# Patient Record
Sex: Male | Born: 1950 | Race: White | Hispanic: No | Marital: Single | Smoking: Former smoker
Health system: Southern US, Community
[De-identification: ages and names within clinical notes are randomized; demographics above are authoritative.]

## PROBLEM LIST (undated history)

## (undated) DIAGNOSIS — R251 Tremor, unspecified: Secondary | ICD-10-CM

## (undated) DIAGNOSIS — K2270 Barrett's esophagus without dysplasia: Secondary | ICD-10-CM

## (undated) DIAGNOSIS — K648 Other hemorrhoids: Secondary | ICD-10-CM

## (undated) DIAGNOSIS — K760 Fatty (change of) liver, not elsewhere classified: Secondary | ICD-10-CM

## (undated) DIAGNOSIS — H269 Unspecified cataract: Secondary | ICD-10-CM

## (undated) DIAGNOSIS — G709 Myoneural disorder, unspecified: Secondary | ICD-10-CM

## (undated) DIAGNOSIS — H3530 Unspecified macular degeneration: Secondary | ICD-10-CM

## (undated) DIAGNOSIS — E349 Endocrine disorder, unspecified: Secondary | ICD-10-CM

## (undated) DIAGNOSIS — M1990 Unspecified osteoarthritis, unspecified site: Secondary | ICD-10-CM

## (undated) DIAGNOSIS — G629 Polyneuropathy, unspecified: Secondary | ICD-10-CM

## (undated) DIAGNOSIS — M47819 Spondylosis without myelopathy or radiculopathy, site unspecified: Secondary | ICD-10-CM

## (undated) DIAGNOSIS — N400 Enlarged prostate without lower urinary tract symptoms: Secondary | ICD-10-CM

## (undated) DIAGNOSIS — E785 Hyperlipidemia, unspecified: Secondary | ICD-10-CM

## (undated) DIAGNOSIS — R600 Localized edema: Secondary | ICD-10-CM

## (undated) DIAGNOSIS — K5790 Diverticulosis of intestine, part unspecified, without perforation or abscess without bleeding: Secondary | ICD-10-CM

## (undated) DIAGNOSIS — K429 Umbilical hernia without obstruction or gangrene: Secondary | ICD-10-CM

## (undated) DIAGNOSIS — F32A Depression, unspecified: Secondary | ICD-10-CM

## (undated) DIAGNOSIS — E119 Type 2 diabetes mellitus without complications: Secondary | ICD-10-CM

## (undated) DIAGNOSIS — K8681 Exocrine pancreatic insufficiency: Secondary | ICD-10-CM

## (undated) DIAGNOSIS — G4730 Sleep apnea, unspecified: Secondary | ICD-10-CM

## (undated) DIAGNOSIS — D132 Benign neoplasm of duodenum: Secondary | ICD-10-CM

## (undated) DIAGNOSIS — K219 Gastro-esophageal reflux disease without esophagitis: Secondary | ICD-10-CM

## (undated) DIAGNOSIS — H9190 Unspecified hearing loss, unspecified ear: Secondary | ICD-10-CM

## (undated) DIAGNOSIS — I10 Essential (primary) hypertension: Secondary | ICD-10-CM

## (undated) DIAGNOSIS — F329 Major depressive disorder, single episode, unspecified: Secondary | ICD-10-CM

## (undated) DIAGNOSIS — E039 Hypothyroidism, unspecified: Secondary | ICD-10-CM

## (undated) DIAGNOSIS — N529 Male erectile dysfunction, unspecified: Secondary | ICD-10-CM

## (undated) HISTORY — DX: Polyneuropathy, unspecified: G62.9

## (undated) HISTORY — DX: Benign neoplasm of duodenum: D13.2

## (undated) HISTORY — DX: Gastro-esophageal reflux disease without esophagitis: K21.9

## (undated) HISTORY — DX: Localized edema: R60.0

## (undated) HISTORY — DX: Unspecified osteoarthritis, unspecified site: M19.90

## (undated) HISTORY — DX: Hyperlipidemia, unspecified: E78.5

## (undated) HISTORY — DX: Fatty (change of) liver, not elsewhere classified: K76.0

## (undated) HISTORY — DX: Benign prostatic hyperplasia without lower urinary tract symptoms: N40.0

## (undated) HISTORY — PX: UMBILICAL HERNIA REPAIR: SHX196

## (undated) HISTORY — DX: Major depressive disorder, single episode, unspecified: F32.9

## (undated) HISTORY — DX: Umbilical hernia without obstruction or gangrene: K42.9

## (undated) HISTORY — DX: Male erectile dysfunction, unspecified: N52.9

## (undated) HISTORY — DX: Essential (primary) hypertension: I10

## (undated) HISTORY — DX: Barrett's esophagus without dysplasia: K22.70

## (undated) HISTORY — PX: COLONOSCOPY: SHX174

## (undated) HISTORY — DX: Diverticulosis of intestine, part unspecified, without perforation or abscess without bleeding: K57.90

## (undated) HISTORY — DX: Exocrine pancreatic insufficiency: K86.81

## (undated) HISTORY — DX: Type 2 diabetes mellitus without complications: E11.9

## (undated) HISTORY — DX: Unspecified macular degeneration: H35.30

## (undated) HISTORY — DX: Depression, unspecified: F32.A

## (undated) HISTORY — DX: Myoneural disorder, unspecified: G70.9

## (undated) HISTORY — DX: Endocrine disorder, unspecified: E34.9

## (undated) HISTORY — DX: Spondylosis without myelopathy or radiculopathy, site unspecified: M47.819

## (undated) HISTORY — DX: Unspecified hearing loss, unspecified ear: H91.90

## (undated) HISTORY — DX: Tremor, unspecified: R25.1

## (undated) HISTORY — DX: Unspecified cataract: H26.9

## (undated) HISTORY — DX: Other hemorrhoids: K64.8

## (undated) HISTORY — DX: Sleep apnea, unspecified: G47.30

---
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
## 2000-02-15 ENCOUNTER — Emergency Department (HOSPITAL_COMMUNITY): Admission: EM | Admit: 2000-02-15 | Discharge: 2000-02-15 | Payer: Self-pay | Admitting: Emergency Medicine

## 2000-02-15 ENCOUNTER — Encounter: Payer: Self-pay | Admitting: Emergency Medicine

## 2000-04-03 ENCOUNTER — Inpatient Hospital Stay (HOSPITAL_COMMUNITY): Admission: EM | Admit: 2000-04-03 | Discharge: 2000-04-08 | Payer: Self-pay | Admitting: Psychiatry

## 2001-02-20 ENCOUNTER — Encounter (INDEPENDENT_AMBULATORY_CARE_PROVIDER_SITE_OTHER): Payer: Self-pay | Admitting: Specialist

## 2001-02-20 ENCOUNTER — Other Ambulatory Visit: Admission: RE | Admit: 2001-02-20 | Discharge: 2001-02-20 | Payer: Self-pay | Admitting: Gastroenterology

## 2002-01-02 ENCOUNTER — Emergency Department (HOSPITAL_COMMUNITY): Admission: EM | Admit: 2002-01-02 | Discharge: 2002-01-02 | Payer: Self-pay | Admitting: Emergency Medicine

## 2002-07-09 DIAGNOSIS — D126 Benign neoplasm of colon, unspecified: Secondary | ICD-10-CM | POA: Insufficient documentation

## 2003-08-03 ENCOUNTER — Ambulatory Visit (HOSPITAL_BASED_OUTPATIENT_CLINIC_OR_DEPARTMENT_OTHER): Admission: RE | Admit: 2003-08-03 | Discharge: 2003-08-03 | Payer: Self-pay | Admitting: Family Medicine

## 2003-10-25 ENCOUNTER — Ambulatory Visit (HOSPITAL_BASED_OUTPATIENT_CLINIC_OR_DEPARTMENT_OTHER): Admission: RE | Admit: 2003-10-25 | Discharge: 2003-10-25 | Payer: Self-pay | Admitting: Family Medicine

## 2003-11-15 ENCOUNTER — Other Ambulatory Visit (HOSPITAL_COMMUNITY): Admission: RE | Admit: 2003-11-15 | Discharge: 2003-11-18 | Payer: Self-pay | Admitting: Psychiatry

## 2004-12-13 ENCOUNTER — Ambulatory Visit: Payer: Self-pay | Admitting: Gastroenterology

## 2004-12-25 ENCOUNTER — Ambulatory Visit: Payer: Self-pay | Admitting: Gastroenterology

## 2005-05-07 ENCOUNTER — Ambulatory Visit: Payer: Self-pay | Admitting: Gastroenterology

## 2005-07-25 ENCOUNTER — Ambulatory Visit: Payer: Self-pay | Admitting: Gastroenterology

## 2005-08-01 ENCOUNTER — Encounter (INDEPENDENT_AMBULATORY_CARE_PROVIDER_SITE_OTHER): Payer: Self-pay | Admitting: Specialist

## 2005-08-01 ENCOUNTER — Ambulatory Visit: Payer: Self-pay | Admitting: Gastroenterology

## 2005-08-01 DIAGNOSIS — K5730 Diverticulosis of large intestine without perforation or abscess without bleeding: Secondary | ICD-10-CM | POA: Insufficient documentation

## 2007-01-02 ENCOUNTER — Ambulatory Visit: Payer: Self-pay | Admitting: Gastroenterology

## 2007-01-29 ENCOUNTER — Ambulatory Visit: Payer: Self-pay | Admitting: Gastroenterology

## 2007-01-29 ENCOUNTER — Encounter: Payer: Self-pay | Admitting: Gastroenterology

## 2007-01-29 DIAGNOSIS — K2270 Barrett's esophagus without dysplasia: Secondary | ICD-10-CM | POA: Insufficient documentation

## 2007-02-26 ENCOUNTER — Ambulatory Visit: Payer: Self-pay | Admitting: Gastroenterology

## 2007-03-06 ENCOUNTER — Encounter: Payer: Self-pay | Admitting: Gastroenterology

## 2007-03-06 ENCOUNTER — Ambulatory Visit (HOSPITAL_COMMUNITY): Admission: RE | Admit: 2007-03-06 | Discharge: 2007-03-06 | Payer: Self-pay | Admitting: Gastroenterology

## 2007-03-06 DIAGNOSIS — K648 Other hemorrhoids: Secondary | ICD-10-CM | POA: Insufficient documentation

## 2007-03-11 ENCOUNTER — Ambulatory Visit: Payer: Self-pay | Admitting: Gastroenterology

## 2007-04-08 ENCOUNTER — Ambulatory Visit: Payer: Self-pay | Admitting: Gastroenterology

## 2007-04-08 ENCOUNTER — Ambulatory Visit (HOSPITAL_BASED_OUTPATIENT_CLINIC_OR_DEPARTMENT_OTHER): Admission: RE | Admit: 2007-04-08 | Discharge: 2007-04-08 | Payer: Self-pay | Admitting: Family Medicine

## 2007-04-13 ENCOUNTER — Ambulatory Visit: Payer: Self-pay | Admitting: Internal Medicine

## 2007-08-28 HISTORY — PX: BACK SURGERY: SHX140

## 2007-12-01 DIAGNOSIS — K219 Gastro-esophageal reflux disease without esophagitis: Secondary | ICD-10-CM | POA: Insufficient documentation

## 2007-12-01 DIAGNOSIS — R1310 Dysphagia, unspecified: Secondary | ICD-10-CM | POA: Insufficient documentation

## 2008-02-06 ENCOUNTER — Ambulatory Visit (HOSPITAL_COMMUNITY): Admission: RE | Admit: 2008-02-06 | Discharge: 2008-02-07 | Payer: Self-pay | Admitting: Neurosurgery

## 2008-05-01 ENCOUNTER — Encounter: Admission: RE | Admit: 2008-05-01 | Discharge: 2008-05-01 | Payer: Self-pay | Admitting: Neurosurgery

## 2008-08-18 ENCOUNTER — Encounter: Admission: RE | Admit: 2008-08-18 | Discharge: 2008-08-18 | Payer: Self-pay | Admitting: Neurosurgery

## 2009-01-19 ENCOUNTER — Encounter (INDEPENDENT_AMBULATORY_CARE_PROVIDER_SITE_OTHER): Payer: Self-pay | Admitting: *Deleted

## 2009-01-22 ENCOUNTER — Encounter: Admission: RE | Admit: 2009-01-22 | Discharge: 2009-01-22 | Payer: Self-pay | Admitting: Neurosurgery

## 2009-02-16 ENCOUNTER — Ambulatory Visit: Payer: Self-pay | Admitting: Gastroenterology

## 2009-02-21 ENCOUNTER — Ambulatory Visit: Payer: Self-pay | Admitting: Gastroenterology

## 2009-02-21 ENCOUNTER — Encounter: Payer: Self-pay | Admitting: Gastroenterology

## 2009-03-02 ENCOUNTER — Encounter: Payer: Self-pay | Admitting: Gastroenterology

## 2009-03-03 ENCOUNTER — Telehealth: Payer: Self-pay | Admitting: Gastroenterology

## 2010-09-17 ENCOUNTER — Encounter: Payer: Self-pay | Admitting: Neurology

## 2011-01-09 NOTE — Op Note (Signed)
NAME:  Robert Castro, Robert Castro               ACCOUNT NO.:  [id number]   MEDICAL RECORD NO.:  [id number]          PATIENT TYPE:  OIB   LOCATION:  3536                         FACILITY:  [facility]   PHYSICIAN:  Henry A. Pool, M.D.    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   PROCEDURE NOTE:  Bilateral L4-L5 decompressive laminotomies and L4-L5  foraminotomies and left L4-L5 microdiskectomy.   SURGEON:  Kathaleen Maser. Pool, MD   ASSISTANT:  Tia Alert, MD   ANESTHESIA:  General endotracheal.   INDICATIONS:  Robert Castro is a 60 year old male with history of back and  bilateral lower extremity pain, right greater than left, failing  conservative management.  Workup demonstrates evidence of significant  facet arthropathy and associated leftward disk herniation at L4-L5  causing marked spinal stenosis.  The patient has been counseled as to  his options.  He has failed conservative management.  He has decided to  proceed with L4-L5 decompression and possible microdiskectomy in hopes  of improving his symptoms.   OPERATIVE NOTE:  The patient placed on the operative table in supine  position.  After a level of anesthesia achieved, the patient was placed  prone on Wilson frame firmly padded.  The patient's lumbar region was  prepped and draped.  A 10 blade was used to make a curvilinear skin  incision overlying the L4-L5 interspace.  This was carried down sharply  to the midline.  Subperiosteal dissection was performed exposing the  lamina and facet joints of L4-L5 bilaterally.  Deep self-retaining  retractor was placed.  Intraoperative x-rays were used and levels were  confirmed.  Laminotomy was then performed using high-speed drill  and  Kerrison rongeurs to remove the inferior aspect of the lamina of L4,  medial aspect of the L4-L5 facet joint, and the superior rim of the L5  lamina.  Ligamentum flavum was then elevated and resected in usual  fashion.  Underlying thecal sac and exiting L4 and L5 nerve roots were  identified bilaterally.  Epidural venous plexus was coagulated and cut.  Starting first on the left side, microscope x4, we  used  microdissection.  Thecal sac and L5 nerve root gently mobilized and  tracked towards its midline   .  Disk herniation was readily apparent.  This was then incised with 15 blade in a rectangular fashion to widen  the disk space.  Clean-out was achieved using pituitary rongeurs,  upbiting pituitary rongeurs and Epstein curettes.  All loose  degenerative disk material was then removed. from the interspace.  At  this point, a very thorough decompression had been achieved.  Attention  was then placed to the patient's right side.  Thecal sac and nerve roots  were gently mobilized in the right side.  The disk space was slightly  bulging, but not herniated.  There was no evidence  that diskectomy on  this side would be beneficial.  Bone spurs easily at all neural  foramina.  There is no injury to thecal sac or nerve roots.  Wound was  then irrigated with antibiotic solution.  Gelfoam was placed topically  for hemostasis, which was found be good.  Microscope and retractor  system were removed.  Hemostasis  was then achieved with electrocautery.  Wound was then closed in layers  with Vicryl suture.  Steri-Strips and sterile dressing were applied.  There were no complications.  The patient tolerated the procedure well,  and he returned to the recovery room postoperatively.           ______________________________  Kathaleen Maser Pool, M.D.     HAP/MEDQ  D:  02/06/2008  T:  02/07/2008  Job:  [id number]

## 2011-01-09 NOTE — Assessment & Plan Note (Signed)
 HEALTHCARE                         GASTROENTEROLOGY OFFICE NOTE   Robert Castro, Robert Castro                      MRN:          [id number]  DATE:01/02/2007                            DOB:          [birth year]    PROBLEM:  Rectal discomfort and discharge.   Robert Castro has returned for evaluation of the above.  He has noticed some  rectal discharge and soreness with defecation.  He has had trouble  cleaning himself, as well.  He was given a rectal foam, but without  improvement in his symptoms.  He has a history of Barrett's esophagus  and was last examined in 2006 and was scheduled for follow up.  He has  no upper GI complaints including pyrosis or dysphagia.   PHYSICAL EXAMINATION:  VITAL SIGNS:  Pulse 104, blood pressure 130/70,  weight 235.  RECTAL:  There are no rectal masses.  Stool is hemocult negative.   IMPRESSION:  1. Probable symptomatic hemorrhoids.  2. History of Barrett's esophagus.   RECOMMENDATIONS:  1. [facility].  2. Band ligation of his hemorrhoids.  3. Follow up endoscopy for Barrett's.     Barbette Hair. Arlyce Dice, MD,FACG  Electronically Signed    RDK/MedQ  DD: 01/02/2007  DT: 01/02/2007  Job #: [id number]   cc:   Marjory Lies, M.D.

## 2011-01-09 NOTE — Assessment & Plan Note (Signed)
[facility]                         GASTROENTEROLOGY OFFICE NOTE   LEAF, KERNODLE                      MRN:          [id number]  DATE:04/08/2007                            DOB:          1951/02/08    PROBLEM:  Hemorrhoids.   Mr. Thieme has returned following band ligation of his internal  hemorrhoids.  That was done approximately 1 month ago.  Since that time,  he has had no further rectal discomfort, or incontinence.  He claims  that he is significantly improved.  He has minimal external irritation.   EXAMINATION:  Pulse 80.  Blood pressure 120/90.  Weight 248.  There are no external lesions on rectal exam.   IMPRESSION:  Internal hemorrhoids, significantly improved following band  ligation.   RECOMMENDATIONS:  __________  to be applied to his rectal area as  needed.     Barbette Hair. Arlyce Dice, MD,FACG  Electronically Signed    RDK/MedQ  DD: 04/08/2007  DT: 04/09/2007  Job #: [id number]   cc:   Marjory Lies, M.D.

## 2011-01-09 NOTE — Procedures (Signed)
NAME:  Robert Castro, Robert Castro               ACCOUNT NO.:  [id number]   MEDICAL RECORD NO.:  [id number]          PATIENT TYPE:  OUT   LOCATION:  SLEEP CENTER                 FACILITY:  [facility]   PHYSICIAN:  Clinton D. Maple Hudson, MD, FCCP, FACPDATE OF BIRTH:  1950/12/07   DATE OF STUDY:  04/08/2007                            NOCTURNAL POLYSOMNOGRAM   REFERRING PHYSICIAN:  Evelena Peat, M.D.   REFERRING PHYSICIAN:  Evelena Peat, M.D.   INDICATION FOR STUDY:  Hypersomnia with sleep apnea.   EPWORTH SLEEPINESS SCORE:  8/24.   BMI 31.4. Weight 245 pounds.   MEDICATIONS:  Home medications are listed and reviewed.   A diagnostic NPSG on August 03, 2003 recorded an AHI of 20 per hour.  CPAP titration is requested.   SLEEP ARCHITECTURE:  Total sleep time 376 minutes with sleep efficiency  93%. Stage I was 7%. Stage II 70%. Stage III absent. REM 23% of total  sleep time. Sleep latency 2 minutes. REM latency 70 minutes. Awake after  sleep onset 20 minutes. Arousal index 12.1. Zolpidem was taken at 2115.   RESPIRATORY DATA:  CPAP titration protocol. CPAP was titrated to 10 CWP,  AHI 12.3 per hour. Technician noted no snoring at this pressure which  was sustained for only 9.7 minutes with a total of 2 hypopneas recorded  at that pressure. Pressures of 8 and 9 were sustained for longer periods  of time with a few breakthrough events noted. Ten CWP would be the  suggested initial trial pressure. A medium ResMed Quattro full-face mask  was used with a heated humidifier.   OXYGEN DATA:  Moderate snoring before CPAP control with mean oxygen  saturation on CPAP 95%.   CARDIAC DATA:  Normal sinus rhythm.   MOVEMENT-PARASOMNIA:  A total of 49 limb jerks were recorded of which 27  were associated with arousal or awakening for a periodic limb movement  with arousal index of 4.3 per hour which is of uncertain significance.   IMPRESSIONS-RECOMMENDATIONS:  1. CPAP was taken to a total of 10 CWP which  stopped snoring. There      were 2 breakthrough hypopnea events resulting in an AHI at this      pressure of 12.3 for the recording time. This would appear to be an      appropriate pressure setting for home CPAP use. If insufficient      based on clinical experience, then 1 or 2 steps higher may also be      appropriate. A medium ResMed Mirage Quattro full-face mask was      chosen, with heated humidifier.  2. Diagnostic NPSG on August 03, 2003 had recorded an AHI of 20 per      hour.  3. Periodic limb movement with arousal 4.3 per hour of uncertain      clinical significance, unless home experience reports substantial      sleep disturbance from leg movement.      Clinton D. Maple Hudson, MD, [facility], FACP  Diplomate, Biomedical engineer of Sleep Medicine  Electronically Signed     CDY/MEDQ  D:  04/13/2007 13:13:47  T:  04/14/2007 09:03:26  Job:  [id number]
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
## 2011-03-05 ENCOUNTER — Ambulatory Visit (AMBULATORY_SURGERY_CENTER): Payer: Federal, State, Local not specified - PPO | Admitting: *Deleted

## 2011-03-05 VITALS — Ht 71.0 in | Wt 242.4 lb

## 2011-03-05 DIAGNOSIS — K2270 Barrett's esophagus without dysplasia: Secondary | ICD-10-CM

## 2011-03-05 NOTE — Progress Notes (Signed)
Discussed with pt the option of using Propofol as sedation for EGD.  Explained how this sedation could be helpful to this pt, due to his medications.  Pt states, "I have had this before and what he used before was just fine.  I want to stick with that."   Writer questioned if pt was on the same medications when he had his last EGD (2008) and pt states, "I am pretty sure that I was."

## 2011-03-12 ENCOUNTER — Encounter: Payer: Self-pay | Admitting: Gastroenterology

## 2011-03-12 ENCOUNTER — Ambulatory Visit (AMBULATORY_SURGERY_CENTER): Payer: Federal, State, Local not specified - PPO | Admitting: Gastroenterology

## 2011-03-12 VITALS — BP 147/74 | HR 89 | Temp 97.2°F | Resp 18 | Ht 71.0 in | Wt 245.0 lb

## 2011-03-12 DIAGNOSIS — K2270 Barrett's esophagus without dysplasia: Secondary | ICD-10-CM

## 2011-03-12 MED ORDER — SODIUM CHLORIDE 0.9 % IV SOLN: 500.0000 mL | INTRAVENOUS | Status: DC

## 2011-03-12 NOTE — Patient Instructions (Signed)
Barrett's Esophagus The esophagus is the muscular tube that carries food and saliva from the mouth to the stomach. Barrett's esophagus involves changes in the esophagus. Some of its lining is replaced by a type of tissue similar to that found in the intestine. This process is called intestinal metaplasia. While Barrett's esophagus may cause no symptoms itself, a small number of people with this condition develop a relatively rare but often deadly type of cancer of the esophagus. It is called esophageal adenocarcinoma. Barrett's esophagus is associated with the common condition called GERD (gastroesophageal reflux disease). HOW THE ESOPHAGUS WORKS The esophagus carries food, liquids, and saliva from the mouth to the stomach. The stomach acts as a container to start digestion and pump food and liquids into the intestines in a controlled process. Food can then be properly digested over time. Nutrients can be taken in (absorbed) by the intestines. The esophagus moves food to the stomach by coordinated contractions of its muscular lining. This process is automatic. People are usually not aware of it. Many people have felt their esophagus when they:  Swallow something too large.   Try to eat too quickly.   Drink very hot or cold liquids.  They then feel the movement of the food or drink down the esophagus into the stomach. This may be an uncomfortable feeling. DIGESTIVE TRACT The muscular layers of the esophagus are normally pinched together at both the upper and lower ends by muscles. These muscles are called sphincters. When a person swallows, the sphincters relax automatically. This allows food or drink to pass from the mouth, into the stomach. The muscles then close rapidly. This prevents the swallowed food or drink from leaking out of the stomach, back into the esophagus or into the mouth. These muscles make it possible to swallow while lying down or even upside-down. When people belch to release  swallowed air or gas from carbonated beverages, the sphincters relax. Then small amounts of food or drink may come back up, briefly. This condition is called reflux. The esophagus quickly squeezes the material back into the stomach. This is considered normal. These functions of the esophagus are an important part of everyday life. However, people who must have their esophagus removed, for example because of cancer, can live a relatively healthy life without it. GERD (GASTROESOPHAGEAL REFLUX DISEASE) Having some stomach contents (liquids or gas) sometimes reflux (come back up from the stomach into the esophagus) is considered normal. When it happens often, and causes other symptoms, it is considered a medical problem or disease. However, it is not necessarily a serious one or one that requires seeing a caregiver. The stomach produces acid and enzymes to digest food. When this mixture refluxes into the esophagus more often than normal or for a longer period of time than normal, it may produce symptoms. These symptoms are often called acid reflux. They are often described by people as heartburn, indigestion, or "gas". The symptoms often consist of a burning sensation below and behind the lower part of the breastbone or sternum. Almost everyone has experienced these symptoms at least once. This is typically a result of overeating. Other things that provoke GERD symptoms include:  Being overweight.   Eating certain types of foods.   Being pregnant.  In most people, GERD symptoms may last only a short time and require no treatment at all.  More continual symptoms are often quickly relieved by over-the-counter acid-reducing agents, such as antacids. Other drugs used to relieve GERD symptoms are antisecretory drugs,   such as histamine2 (H2) blockers or proton pump inhibitors. People who have symptoms often should talk with their caregiver. Other diseases can have similar symptoms. Prescription medicines,  together with other actions, might be needed to reduce reflux. GERD that is untreated over a long period can lead to problems. An example is an ulcer in the esophagus, that could cause bleeding. Another common problem is scar tissue that blocks the movement of swallowed food and drink through the esophagus. This condition is called stricture. Esophageal reflux may also cause certain less common symptoms. These include hoarseness or chronic cough. It sometimes provokes conditions such as asthma. While most patients find that lifestyle changes and acid-blocking drugs relieve their symptoms, caregivers sometimes advise surgery. Overall, GERD is one of the most common medical conditions. About 20 percent of the population can be affected over a lifetime. GERD AND BARRETT'S ESOPHAGUS The exact causes of Barrett's esophagus are not known. It is thought to be caused in part by the same factors that cause GERD. People who do not have heartburn can have Barrett's esophagus. However, it is found about 3 to 5 times more often in people with this condition. Barrett's esophagus is uncommon in children. The average age at diagnosis is 60. But it is usually difficult to know when the problem started. It is about twice as common in men as in women. It is much more common in white men than in men of other races. BARRETT'S ESOPHAGUS AND CANCER OF THE ESOPHAGUS Barrett's esophagus does not cause symptoms itself. However, it seems to precede the development of a particular kind of cancer. This cancer is esophageal adenocarcinoma. The risk of developing this cancer is 30 to 125 times higher in people who have Barrett's esophagus than in people who do not. This type of cancer is increasing quickly in white men. The increase is possibly related to the rise in obesity and GERD. For people who have Barrett's esophagus, the risk of getting cancer of the esophagus is small. It is less than 1 percent (0.4 percent to 0.5 percent) per  year. Esophageal adenocarcinoma is often not curable. This is partly because the disease is often discovered at a late stage and treatments are not effective. DIAGNOSIS (HOW TO TELL WHAT IS WRONG) AND SCREENING Diagnosing Barrett's esophagus is not easy. At the present time it cannot be diagnosed based on symptoms, physical exam, or blood tests. The only useful test is upper gastrointestinal endoscopy and biopsy. In this procedure, a flexible tube called an endoscope is used. This tool is like a pencil sized flexible telescope. It has a light and tiny camera. It is passed into the esophagus. If the tissue appears suspicious to your caregiver, biopsies must be done. A biopsy is the removal of a small piece of tissue. This is done using a pincher-like device passed through the endoscope. A pathologist is a specialist who examines body tissue samples. He or she examines the tissue under a microscope to confirm the diagnosis. Looking for a medical problem in people who do not know whether they have one is called screening. Currently, there are no commonly accepted guidelines on who should have endoscopy, to check for Barrett's esophagus. There are many reasons for the lack of firm recommendations about screening. Among them are the great cost and occasional risk of side effects of the test. Also, the rate of finding Barrett's esophagus is low. Finding the problem early has not been proven to prevent deaths from cancer. Many caregivers advise that   adult patients who are over the age of 40 and have had GERD symptoms for a number of years have endoscopy, to see whether they have Barrett's esophagus. Screening for this condition in people who have no symptoms is not advised. TREATMENT Barrett's esophagus has no cure, other than surgical removal of the esophagus. This is a serious operation. Surgery is advised only for people who have a high risk of developing cancer or who already have it. Most caregivers recommend  treating GERD with acid-blocking drugs. This is sometimes linked to improvement in the extent of the Barrett's tissue. But this approach has not been proven to reduce the risk of cancer. Treating reflux with surgery for GERD also does not seem to cure Barrett's esophagus. Several experimental approaches are under study. One attempts to see whether destroying the Barrett's tissue by heat or other means, through an endoscope, can get rid of the condition. But this approach has potential risks and unknown effectiveness. LOOKING FOR DYSPLASIA AND CANCER Occasional endoscopic examinations to look for early warning signs of cancer are generally advised for people who have Barrett's esophagus. This approach is called surveillance. When people who have Barrett's esophagus develop cancer, the process seems to go through an intermediate stage. In this stage cancer cells appear in the Barrett's tissue. This condition is called dysplasia. It can be seen only in biopsies with a microscope. The process is patchy and cannot be seen directly through the endoscope. So, multiple biopsies must be taken. Even then, it can be missed. The process of change from Barrett's to cancer seems to happen only in a few patients. It is less than 1 percent per year. And it happens over a relatively long period of time. Most caregivers advise that patients with Barrett's esophagus undergo occasional endoscopy to have biopsies. The recommended time between endoscopies varies depending on circumstances. The best time interval has not been decided. TREATMENT FOR DYSPLASIA OR ESOPHAGEAL ADENOCARCINOMA If a person with Barrett's esophagus is found to have dysplasia or cancer, the caregiver will usually recommend surgery. This is if the person is strong enough and has a good chance of being cured. The type of surgery may vary. But it usually involves removing most of the esophagus and pulling the stomach up into the chest to attach it to what  remains of the esophagus. Many patients with Barrett's esophagus are elderly. They may have many other medical problems that make surgery unwise. In these patients, other methods to treat dysplasia are being studied. HOPE THROUGH RESEARCH Many important questions about Barrett's esophagus need further research to:  Find better ways to identify people who have the problem.   Find out what causes it.   Test treatments that may prevent or get rid of it.   Find better treatments for people who have Barrett's esophagus with cancer.  The National Institute of Diabetes and Digestive and Kidney Diseases, and the National Cancer Institute, sponsor research programs to study Barrett's esophagus. IMPORTANT POINTS TO REMEMBER  In Barrett's esophagus, the cells lining the esophagus change. They become similar to the cells lining the intestine.   Barrett's esophagus is connected with gastroesophageal reflux disease or GERD.   A small number of people with Barrett's esophagus may develop esophageal cancer.   Barrett's esophagus is diagnosed by upper gastrointestinal endoscopy and biopsy.   People who have Barrett's esophagus should have periodic esophageal exams.   Taking acid-blocking drugs for GERD may help improve Barrett's esophagus.   Removal of the esophagus is   recommended only for people who have a high risk of developing cancer or who already have it.  ADDITIONAL RESOURCES For more information about GERD or Barrett's esophagus, contact: [employer] ([employer] [address] [place] [address] Phone: [phone] or [phone] Fax: [phone] Email: iffgd@iffgd.org Internet: www.iffgd.org [employer] [facility] [address] [place] [address] Email: nddic@info.niddk.nih.gov Document Released: 11/03/2003 Document Re-Released: 01/31/2010 [employer] Patient Information 2011  [employer] 

## 2011-03-13 ENCOUNTER — Telehealth: Payer: Self-pay

## 2011-03-13 ENCOUNTER — Telehealth: Payer: Self-pay | Admitting: Gastroenterology

## 2011-03-13 NOTE — Telephone Encounter (Signed)
Left message on answering machine. 

## 2011-03-13 NOTE — Telephone Encounter (Signed)
Left message on answering regarding questions asked. Also left message to return call if further questions.

## 2011-03-20 ENCOUNTER — Telehealth: Payer: Self-pay | Admitting: Gastroenterology

## 2011-03-20 NOTE — Telephone Encounter (Signed)
Spoke with pt and reviewed results with the pt, letter mailed to pt today.

## 2011-05-24 LAB — DIFFERENTIAL
Basophils Relative: 0
Eosinophils Absolute: 0
Eosinophils Relative: 0
Monocytes Absolute: 0.2
Monocytes Relative: 3
Neutrophils Relative %: 76

## 2011-05-24 LAB — CBC
HCT: 46.2
MCHC: 34.4
MCV: 94
RBC: 4.92

## 2011-05-24 LAB — TYPE AND SCREEN: Antibody Screen: NEGATIVE

## 2012-07-15 ENCOUNTER — Encounter: Payer: Self-pay | Admitting: Gastroenterology

## 2012-08-25 ENCOUNTER — Ambulatory Visit (AMBULATORY_SURGERY_CENTER): Payer: Federal, State, Local not specified - PPO | Admitting: *Deleted

## 2012-08-25 ENCOUNTER — Encounter: Payer: Self-pay | Admitting: Gastroenterology

## 2012-08-25 VITALS — Ht 72.0 in | Wt 254.6 lb

## 2012-08-25 DIAGNOSIS — Z1211 Encounter for screening for malignant neoplasm of colon: Secondary | ICD-10-CM

## 2012-08-25 MED ORDER — NA SULFATE-K SULFATE-MG SULF 17.5-3.13-1.6 GM/177ML PO SOLN: ORAL | Status: DC

## 2012-08-26 ENCOUNTER — Encounter: Payer: Self-pay | Admitting: Gastroenterology

## 2012-08-27 HISTORY — PX: LUMBAR LAMINECTOMY/ DECOMPRESSION WITH MET-RX: SHX5959

## 2012-09-05 ENCOUNTER — Encounter: Payer: Federal, State, Local not specified - PPO | Admitting: Gastroenterology

## 2012-09-17 ENCOUNTER — Telehealth: Payer: Self-pay | Admitting: Gastroenterology

## 2012-09-17 NOTE — Telephone Encounter (Signed)
Pt.. Stated that he was out of town up until today and his procedure was rescheduled from the 10 of the month to tomorrow and he had eaten some steak and salad on yesterday, pt. Wanted to make sure it was okay to still do procedure. Informed pt. That we could still do procedure and instructed him to stay on clear liquid diet.  I went over prep instructions and made sure he understood them.

## 2012-09-18 ENCOUNTER — Ambulatory Visit (AMBULATORY_SURGERY_CENTER): Payer: Federal, State, Local not specified - PPO | Admitting: Gastroenterology

## 2012-09-18 ENCOUNTER — Encounter: Payer: Self-pay | Admitting: Gastroenterology

## 2012-09-18 VITALS — BP 118/80 | HR 67 | Temp 96.1°F | Resp 18 | Ht 72.0 in | Wt 254.0 lb

## 2012-09-18 DIAGNOSIS — K5730 Diverticulosis of large intestine without perforation or abscess without bleeding: Secondary | ICD-10-CM

## 2012-09-18 DIAGNOSIS — D126 Benign neoplasm of colon, unspecified: Secondary | ICD-10-CM

## 2012-09-18 DIAGNOSIS — Z1211 Encounter for screening for malignant neoplasm of colon: Secondary | ICD-10-CM

## 2012-09-18 LAB — GLUCOSE, CAPILLARY: Glucose-Capillary: 126 mg/dL — ABNORMAL HIGH (ref 70–99)

## 2012-09-18 MED ORDER — SODIUM CHLORIDE 0.9 % IV SOLN: 500.0000 mL | INTRAVENOUS | Status: DC

## 2012-09-18 NOTE — Progress Notes (Signed)
Called to room to assist during endoscopic procedure.  Patient ID and intended procedure confirmed with present staff. Received instructions for my participation in the procedure from the performing physician.  

## 2012-09-18 NOTE — Patient Instructions (Addendum)
YOU HAD AN ENDOSCOPIC PROCEDURE TODAY AT THE [facility]: Refer to the procedure report that was given to you for any specific questions about what was found during the examination.  If the procedure report does not answer your questions, please call your gastroenterologist to clarify.  If you requested that your care partner not be given the details of your procedure findings, then the procedure report has been included in a sealed envelope for you to review at your convenience later.  YOU SHOULD EXPECT: Some feelings of bloating in the abdomen. Passage of more gas than usual.  Walking can help get rid of the air that was put into your GI tract during the procedure and reduce the bloating. If you had a lower endoscopy (such as a colonoscopy or flexible sigmoidoscopy) you may notice spotting of blood in your stool or on the toilet paper. If you underwent a bowel prep for your procedure, then you may not have a normal bowel movement for a few days.  DIET: Your first meal following the procedure should be a light meal and then it is ok to progress to your normal diet.  A half-sandwich or bowl of soup is an example of a good first meal.  Heavy or fried foods are harder to digest and may make you feel nauseous or bloated.  Likewise meals heavy in dairy and vegetables can cause extra gas to form and this can also increase the bloating.  Drink plenty of fluids but you should avoid alcoholic beverages for 24 hours.  ACTIVITY: Your care partner should take you home directly after the procedure.  You should plan to take it easy, moving slowly for the rest of the day.  You can resume normal activity the day after the procedure however you should NOT DRIVE or use heavy machinery for 24 hours (because of the sedation medicines used during the test).    SYMPTOMS TO REPORT IMMEDIATELY: A gastroenterologist can be reached at any hour.  During normal business hours, 8:30 AM to 5:00 PM Monday through Friday,  call [phone].  After hours and on weekends, please call the GI answering service at [phone] who will take a message and have the physician on call contact you.   Following lower endoscopy (colonoscopy or flexible sigmoidoscopy):  Excessive amounts of blood in the stool  Significant tenderness or worsening of abdominal pains  Swelling of the abdomen that is new, acute  Fever of 100F or higher  FOLLOW UP: If any biopsies were taken you will be contacted by phone or by letter within the next 1-3 weeks.  Call your gastroenterologist if you have not heard about the biopsies in 3 weeks.  Our staff will call the home number listed on your records the next business day following your procedure to check on you and address any questions or concerns that you may have at that time regarding the information given to you following your procedure. This is a courtesy call and so if there is no answer at the home number and we have not heard from you through the emergency physician on call, we will assume that you have returned to your regular daily activities without incident.  SIGNATURES/CONFIDENTIALITY: You and/or your care partner have signed paperwork which will be entered into your electronic medical record.  These signatures attest to the fact that that the information above on your After Visit Summary has been reviewed and is understood.  Full responsibility of the confidentiality of this   discharge information lies with you and/or your care-partner.   Thank-you for choosing us for your healthcare needs. 

## 2012-09-18 NOTE — Progress Notes (Signed)
VSS, A&O x3, Pleased with MAC. Report to Cory Roughen, DRM

## 2012-09-18 NOTE — Progress Notes (Addendum)
Patient did not have preoperative order for IV antibiotic SSI prophylaxis. (G8918)  Patient did not experience any of the following events: a burn prior to discharge; a fall within the facility; wrong site/side/patient/procedure/implant event; or a hospital transfer or hospital admission upon discharge from the facility. (G8907)  

## 2012-09-18 NOTE — Op Note (Signed)
[facility] [address] [place], [address]   COLONOSCOPY PROCEDURE REPORT  PATIENT: Robert Castro, Robert Castro  MR#: [id number] BIRTHDATE: 30-Sep-1950 , 61  yrs. old GENDER: Male ENDOSCOPIST: Louis Meckel, MD REFERRED XB:JYNW Arlyce Dice, [place] PROCEDURE DATE:  09/18/2012 PROCEDURE:   Colonoscopy with snare polypectomy ASA CLASS:   Class II INDICATIONS:Patient's personal history of adenomatous colon polyps. index polypectomy 2003 with adenomatous polyp; 2006 hyperplastic polyps MEDICATIONS: MAC sedation, administered by CRNA and propofol (Diprivan) 450mg  IV  DESCRIPTION OF PROCEDURE:   After the risks benefits and alternatives of the procedure were thoroughly explained, informed consent was obtained.  A digital rectal exam revealed no abnormalities of the rectum.   The LB [id number]  endoscope was introduced through the anus and advanced to the cecum, which was identified by both the appendix and ileocecal valve. No adverse events experienced.   The quality of the prep was Suprep fair  The instrument was then slowly withdrawn as the colon was fully examined.      COLON FINDINGS: A flat polyp was found in the descending colon.  A polypectomy was performed with a cold snare.  The resection was complete and the polyp tissue was completely retrieved.   A sessile polyp measuring 3 mm in size was found in the sigmoid colon.  A polypectomy was performed with a cold snare.  The resection was complete and the polyp tissue was completely retrieved.   Moderate diverticulosis was noted in the sigmoid colon and descending colon. The colon mucosa was otherwise normal.  Retroflexed views revealed no abnormalities. The time to cecum=5 minutes 33 seconds. Withdrawal time=14 minutes 25 seconds.  The scope was withdrawn and the procedure completed. COMPLICATIONS: There were no complications.  ENDOSCOPIC IMPRESSION: 1.   Flat polyp was found in the descending colon; polypectomy  was performed with a cold snare 2.   Sessile polyp measuring 3 mm in size was found in the sigmoid colon; polypectomy was performed with a cold snare 3.   Moderate diverticulosis was noted in the sigmoid colon and descending colon 4.   The colon mucosa was otherwise normal  RECOMMENDATIONS: If the polyp(s) removed today are proven to be adenomatous (pre-cancerous) polyps, you will need a repeat colonoscopy in 5 years.  Otherwise you should continue to follow colorectal cancer screening guidelines for "routine risk" patients with colonoscopy in 10 years.  You will receive a letter within 1-2 weeks with the results of your biopsy as well as final recommendations.  Please call my office if you have not received a letter after 3 weeks.   eSigned:  Louis Meckel, MD 09/18/2012 11:10 AM   cc:   PATIENT NAME:  Sabin, Gibeault MR#: [id number]

## 2012-09-19 ENCOUNTER — Telehealth: Payer: Self-pay | Admitting: Gastroenterology

## 2012-09-19 ENCOUNTER — Telehealth: Payer: Self-pay | Admitting: *Deleted

## 2012-09-19 NOTE — Telephone Encounter (Signed)
  Follow up Call-  Call back number 09/18/2012 03/12/2011  Post procedure Call Back phone  # [phone]  Permission to leave phone message Yes -   [facility]

## 2012-09-22 NOTE — Telephone Encounter (Signed)
Used to for upper endoscopy sometime this year. He should be taking omeprazole 20 mg daily.   He does not have to specifically avoid any foods.

## 2012-09-22 NOTE — Telephone Encounter (Signed)
Pt states that the nurse in the [facility] was discussing with him the foods he should avoid and that he should be taking omeprazole 40mg  daily. Pt states he was never told to avoid any foods and he has only been taking Omeprazole 20mg  daily. Pt is concerned and wants to know since he has not been doing these things and that his [employer] cobra is coming to an end should he go ahead and have an EGD done. Dr. Arlyce Dice please advise.

## 2012-09-22 NOTE — Telephone Encounter (Signed)
Pt aware. Pt requests a call to scheduled EGD around November.

## 2012-09-25 ENCOUNTER — Encounter: Payer: Self-pay | Admitting: Gastroenterology

## 2012-09-29 ENCOUNTER — Telehealth: Payer: Self-pay | Admitting: Gastroenterology

## 2012-09-30 NOTE — Telephone Encounter (Signed)
Discussed results with the pt and let him know a letter has been mailed to his home.

## 2012-09-30 NOTE — Telephone Encounter (Signed)
Left message for pt to call back  °

## 2013-01-02 ENCOUNTER — Encounter (INDEPENDENT_AMBULATORY_CARE_PROVIDER_SITE_OTHER): Payer: Self-pay | Admitting: Ophthalmology

## 2013-01-20 ENCOUNTER — Encounter (INDEPENDENT_AMBULATORY_CARE_PROVIDER_SITE_OTHER): Payer: Federal, State, Local not specified - PPO | Admitting: Ophthalmology

## 2013-01-20 DIAGNOSIS — H35039 Hypertensive retinopathy, unspecified eye: Secondary | ICD-10-CM

## 2013-01-20 DIAGNOSIS — H43819 Vitreous degeneration, unspecified eye: Secondary | ICD-10-CM

## 2013-01-20 DIAGNOSIS — I10 Essential (primary) hypertension: Secondary | ICD-10-CM

## 2013-01-20 DIAGNOSIS — H2510 Age-related nuclear cataract, unspecified eye: Secondary | ICD-10-CM

## 2013-01-20 DIAGNOSIS — H3530 Unspecified macular degeneration: Secondary | ICD-10-CM

## 2013-04-28 ENCOUNTER — Other Ambulatory Visit: Payer: Self-pay | Admitting: Family Medicine

## 2013-04-28 DIAGNOSIS — R7989 Other specified abnormal findings of blood chemistry: Secondary | ICD-10-CM

## 2013-05-04 ENCOUNTER — Other Ambulatory Visit: Payer: Federal, State, Local not specified - PPO

## 2013-05-04 ENCOUNTER — Ambulatory Visit
Admission: RE | Admit: 2013-05-04 | Discharge: 2013-05-04 | Disposition: A | Discharge status: Home / Self Care | Payer: Federal, State, Local not specified - PPO | Source: Ambulatory Visit | Attending: Family Medicine | Admitting: Family Medicine

## 2013-05-04 DIAGNOSIS — R7989 Other specified abnormal findings of blood chemistry: Secondary | ICD-10-CM

## 2013-05-27 ENCOUNTER — Encounter: Payer: Self-pay | Admitting: Gastroenterology

## 2013-06-05 ENCOUNTER — Encounter: Payer: Self-pay | Admitting: Gastroenterology

## 2013-06-05 ENCOUNTER — Telehealth: Payer: Self-pay | Admitting: Gastroenterology

## 2013-06-05 NOTE — Telephone Encounter (Signed)
Pt wanted to move up his EGD to 06/2013 because he has good insurance and it will change in November. We discussed his last EGD in 2012 and the path and the path letter and he is not due until 2015, unless he is having problems. Pt reports he has Barrett's but everything is under control; he will wait until 2015.

## 2014-03-13 ENCOUNTER — Emergency Department (HOSPITAL_BASED_OUTPATIENT_CLINIC_OR_DEPARTMENT_OTHER)
Admission: EM | Admit: 2014-03-13 | Discharge: 2014-03-13 | Disposition: A | Discharge status: Home / Self Care | Payer: BC Managed Care – PPO | Attending: Emergency Medicine | Admitting: Emergency Medicine

## 2014-03-13 ENCOUNTER — Encounter (HOSPITAL_BASED_OUTPATIENT_CLINIC_OR_DEPARTMENT_OTHER): Payer: Self-pay | Admitting: Emergency Medicine

## 2014-03-13 ENCOUNTER — Emergency Department (HOSPITAL_BASED_OUTPATIENT_CLINIC_OR_DEPARTMENT_OTHER): Payer: BC Managed Care – PPO

## 2014-03-13 DIAGNOSIS — Z87891 Personal history of nicotine dependence: Secondary | ICD-10-CM | POA: Insufficient documentation

## 2014-03-13 DIAGNOSIS — G4730 Sleep apnea, unspecified: Secondary | ICD-10-CM | POA: Insufficient documentation

## 2014-03-13 DIAGNOSIS — R296 Repeated falls: Secondary | ICD-10-CM | POA: Insufficient documentation

## 2014-03-13 DIAGNOSIS — Z9889 Other specified postprocedural states: Secondary | ICD-10-CM | POA: Insufficient documentation

## 2014-03-13 DIAGNOSIS — E119 Type 2 diabetes mellitus without complications: Secondary | ICD-10-CM | POA: Insufficient documentation

## 2014-03-13 DIAGNOSIS — F329 Major depressive disorder, single episode, unspecified: Secondary | ICD-10-CM | POA: Insufficient documentation

## 2014-03-13 DIAGNOSIS — S79929A Unspecified injury of unspecified thigh, initial encounter: Principal | ICD-10-CM

## 2014-03-13 DIAGNOSIS — F3289 Other specified depressive episodes: Secondary | ICD-10-CM | POA: Insufficient documentation

## 2014-03-13 DIAGNOSIS — M129 Arthropathy, unspecified: Secondary | ICD-10-CM | POA: Insufficient documentation

## 2014-03-13 DIAGNOSIS — Y929 Unspecified place or not applicable: Secondary | ICD-10-CM | POA: Insufficient documentation

## 2014-03-13 DIAGNOSIS — G8929 Other chronic pain: Secondary | ICD-10-CM | POA: Insufficient documentation

## 2014-03-13 DIAGNOSIS — S79919A Unspecified injury of unspecified hip, initial encounter: Secondary | ICD-10-CM | POA: Insufficient documentation

## 2014-03-13 DIAGNOSIS — IMO0002 Reserved for concepts with insufficient information to code with codable children: Secondary | ICD-10-CM | POA: Insufficient documentation

## 2014-03-13 DIAGNOSIS — I10 Essential (primary) hypertension: Secondary | ICD-10-CM | POA: Insufficient documentation

## 2014-03-13 DIAGNOSIS — M25551 Pain in right hip: Secondary | ICD-10-CM

## 2014-03-13 DIAGNOSIS — Y9389 Activity, other specified: Secondary | ICD-10-CM | POA: Insufficient documentation

## 2014-03-13 DIAGNOSIS — K219 Gastro-esophageal reflux disease without esophagitis: Secondary | ICD-10-CM | POA: Insufficient documentation

## 2014-03-13 NOTE — Discharge Instructions (Signed)
Arthralgia °Your caregiver has diagnosed you as suffering from an arthralgia. Arthralgia means there is pain in a joint. This can come from many reasons including: °· Bruising the joint which causes soreness (inflammation) in the joint. °· Wear and tear on the joints which occur as we grow older (osteoarthritis). °· Overusing the joint. °· Various forms of arthritis. °· Infections of the joint. °Regardless of the cause of pain in your joint, most of these different pains respond to anti-inflammatory drugs and rest. The exception to this is when a joint is infected, and these cases are treated with antibiotics, if it is a bacterial infection. °HOME CARE INSTRUCTIONS  °· Rest the injured area for as long as directed by your caregiver. Then slowly start using the joint as directed by your caregiver and as the pain allows. Crutches as directed may be useful if the ankles, knees or hips are involved. If the knee was splinted or casted, continue use and care as directed. If an stretchy or elastic wrapping bandage has been applied today, it should be removed and re-applied every 3 to 4 hours. It should not be applied tightly, but firmly enough to keep swelling down. Watch toes and feet for swelling, bluish discoloration, coldness, numbness or excessive pain. If any of these problems (symptoms) occur, remove the ace bandage and re-apply more loosely. If these symptoms persist, contact your caregiver or return to this location. °· For the first 24 hours, keep the injured extremity elevated on pillows while lying down. °· Apply ice for 15-20 minutes to the sore joint every couple hours while awake for the first half day. Then 03-04 times per day for the first 48 hours. Put the ice in a plastic bag and place a towel between the bag of ice and your skin. °· Wear any splinting, casting, elastic bandage applications, or slings as instructed. °· Only take over-the-counter or prescription medicines for pain, discomfort, or fever as  directed by your caregiver. Do not use aspirin immediately after the injury unless instructed by your physician. Aspirin can cause increased bleeding and bruising of the tissues. °· If you were given crutches, continue to use them as instructed and do not resume weight bearing on the sore joint until instructed. °Persistent pain and inability to use the sore joint as directed for more than 2 to 3 days are warning signs indicating that you should see a caregiver for a follow-up visit as soon as possible. Initially, a hairline fracture (break in bone) may not be evident on X-rays. Persistent pain and swelling indicate that further evaluation, non-weight bearing or use of the joint (use of crutches or slings as instructed), or further X-rays are indicated. X-rays may sometimes not show a small fracture until a week or 10 days later. Make a follow-up appointment with your own caregiver or one to whom we have referred you. A radiologist (specialist in reading X-rays) may read your X-rays. Make sure you know how you are to obtain your X-ray results. Do not assume everything is normal if you do not hear from us. °SEEK MEDICAL CARE IF: °Bruising, swelling, or pain increases. °SEEK IMMEDIATE MEDICAL CARE IF:  °· Your fingers or toes are numb or blue. °· The pain is not responding to medications and continues to stay the same or get worse. °· The pain in your joint becomes severe. °· You develop a fever over 102° F (38.9° C). °· It becomes impossible to move or use the joint. °MAKE SURE YOU:  °·   Understand these instructions.  Will watch your condition.  Will get help right away if you are not doing well or get worse. Document Released: 08/13/2005 Document Revised: 11/05/2011 Document Reviewed: 03/31/2008 [facility] Patient Information 2015 [place], [place]. This information is not intended to replace advice given to you by your health care provider. Make sure you discuss any questions you have with your health care  provider. Cryotherapy Cryotherapy means treatment with cold. Ice or gel packs can be used to reduce both pain and swelling. Ice is the most helpful within the first 24 to 48 hours after an injury or flareup from overusing a muscle or joint. Sprains, strains, spasms, burning pain, shooting pain, and aches can all be eased with ice. Ice can also be used when recovering from surgery. Ice is effective, has very few side effects, and is safe for most people to use. PRECAUTIONS  Ice is not a safe treatment option for people with:  Raynaud's phenomenon. This is a condition affecting small blood vessels in the extremities. Exposure to cold may cause your problems to return.  Cold hypersensitivity. There are many forms of cold hypersensitivity, including:  Cold urticaria. Red, itchy hives appear on the skin when the tissues begin to warm after being iced.  Cold erythema. This is a red, itchy rash caused by exposure to cold.  Cold hemoglobinuria. Red blood cells break down when the tissues begin to warm after being iced. The hemoglobin that carry oxygen are passed into the urine because they cannot combine with blood proteins fast enough.  Numbness or altered sensitivity in the area being iced. If you have any of the following conditions, do not use ice until you have discussed cryotherapy with your caregiver:  Heart conditions, such as arrhythmia, angina, or chronic heart disease.  High blood pressure.  Healing wounds or open skin in the area being iced.  Current infections.  Rheumatoid arthritis.  Poor circulation.  Diabetes. Ice slows the blood flow in the region it is applied. This is beneficial when trying to stop inflamed tissues from spreading irritating chemicals to surrounding tissues. However, if you expose your skin to cold temperatures for too long or without the proper protection, you can damage your skin or nerves. Watch for signs of skin damage due to cold. HOME CARE  INSTRUCTIONS Follow these tips to use ice and cold packs safely.  Place a dry or damp towel between the ice and skin. A damp towel will cool the skin more quickly, so you may need to shorten the time that the ice is used.  For a more rapid response, add gentle compression to the ice.  Ice for no more than 10 to 20 minutes at a time. The bonier the area you are icing, the less time it will take to get the benefits of ice.  Check your skin after 5 minutes to make sure there are no signs of a poor response to cold or skin damage.  Rest 20 minutes or more in between uses.  Once your skin is numb, you can end your treatment. You can test numbness by very lightly touching your skin. The touch should be so light that you do not see the skin dimple from the pressure of your fingertip. When using ice, most people will feel these normal sensations in this order: cold, burning, aching, and numbness.  Do not use ice on someone who cannot communicate their responses to pain, such as small children or people with dementia. HOW TO MAKE  AN ICE PACK Ice packs are the most common way to use ice therapy. Other methods include ice massage, ice baths, and cryo-sprays. Muscle creams that cause a cold, tingly feeling do not offer the same benefits that ice offers and should not be used as a substitute unless recommended by your caregiver. To make an ice pack, do one of the following:  Place crushed ice or a bag of frozen vegetables in a sealable plastic bag. Squeeze out the excess air. Place this bag inside another plastic bag. Slide the bag into a pillowcase or place a damp towel between your skin and the bag.  Mix 3 parts water with 1 part rubbing alcohol. Freeze the mixture in a sealable plastic bag. When you remove the mixture from the freezer, it will be slushy. Squeeze out the excess air. Place this bag inside another plastic bag. Slide the bag into a pillowcase or place a damp towel between your skin and the  bag. SEEK MEDICAL CARE IF:  You develop white spots on your skin. This may give the skin a blotchy (mottled) appearance.  Your skin turns blue or pale.  Your skin becomes waxy or hard.  Your swelling gets worse. MAKE SURE YOU:   Understand these instructions.  Will watch your condition.  Will get help right away if you are not doing well or get worse. Document Released: 04/09/2011 Document Revised: 11/05/2011 Document Reviewed: 04/09/2011 [facility] Patient Information 2015 [place], [place]. This information is not intended to replace advice given to you by your health care provider. Make sure you discuss any questions you have with your health care provider. Hip Bursitis Bursitis is a swelling and soreness (inflammation) of a fluid-filled sac (bursa). This sac overlies and protects the joints.  CAUSES   Injury.  Overuse of the muscles surrounding the joint.  Arthritis.  Gout.  Infection.  Cold weather.  Inadequate warm-up and conditioning prior to activities. The cause may not be known.  SYMPTOMS   Mild to severe irritation.  Tenderness and swelling over the outside of the hip.  Pain with motion of the hip.  If the bursa becomes infected, a fever may be present. Redness, tenderness, and warmth will develop over the hip. Symptoms usually lessen in 3 to 4 weeks with treatment, but can come back. TREATMENT If conservative treatment does not work, your caregiver may advise draining the bursa and injecting cortisone into the area. This may speed up the healing process. This may also be used as an initial treatment of choice. HOME CARE INSTRUCTIONS   Apply ice to the affected area for 15-20 minutes every 3 to 4 hours while awake for the first 2 days. Put the ice in a plastic bag and place a towel between the bag of ice and your skin.  Rest the painful joint as much as possible, but continue to put the joint through a normal range of motion at least 4 times per day. When  the pain lessens, begin normal, slow movements and usual activities to help prevent stiffness of the hip.  Only take over-the-counter or prescription medicines for pain, discomfort, or fever as directed by your caregiver.  Use crutches to limit weight bearing on the hip joint, if advised.  Elevate your painful hip to reduce swelling. Use pillows for propping and cushioning your legs and hips.  Gentle massage may provide comfort and decrease swelling. SEEK IMMEDIATE MEDICAL CARE IF:   Your pain increases even during treatment, or you are not improving.  You have a fever.  You have heat and inflammation over the involved bursa.  You have any other questions or concerns. MAKE SURE YOU:   Understand these instructions.  Will watch your condition.  Will get help right away if you are not doing well or get worse. Document Released: 02/02/2002 Document Revised: 11/05/2011 Document Reviewed: 09/01/2008 [facility] Patient Information 2015 [place], [place]. This information is not intended to replace advice given to you by your health care provider. Make sure you discuss any questions you have with your health care provider.

## 2014-03-13 NOTE — ED Provider Notes (Signed)
CSN: [id number]     Arrival date & time 03/13/14  1414 History   First MD Initiated Contact with Patient 03/13/14 1430     Chief Complaint  Patient presents with  . Hip Pain     (Consider location/radiation/quality/duration/timing/severity/associated sxs/prior Treatment) Patient is a 63 y.o. male presenting with hip pain. The history is provided by the patient. No language interpreter was used.  Hip Pain Associated symptoms include arthralgias. Pertinent negatives include no abdominal pain, chills, fever or numbness. Associated symptoms comments: Presents with complaint of pain in right hip that has been progressive since a fall on July 5th. He states he has multiple surgeries on his back and has chronic back pain with radiculopathy into leg but this is a different pain. .    Past Medical History  Diagnosis Date  . Arthritis   . Depression   . GERD (gastroesophageal reflux disease)   . 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 Barrett's esophagus   . Testosterone deficiency   . Diabetes   . Hypertension   . Sleep apnea     cpap   Past Surgical History  Procedure Laterality Date  . Back surgery  2009  . Tonsillectomy  1956   Family History  Problem Relation Age of Onset  . Colon cancer Neg Hx   . Stomach cancer Neg Hx    History  Substance Use Topics  . Smoking status: Former Smoker    Quit date: 08/25/2002  . Smokeless tobacco: Never Used  . Alcohol Use: 7.0 - 14.0 oz/week    14-28 drink(s) per week     Comment: varies    Review of Systems  Constitutional: Negative for fever and chills.  Gastrointestinal: Negative.  Negative for abdominal pain.  Musculoskeletal: Positive for arthralgias.       See HPI  Skin: Negative.  Negative for wound.  Neurological: Negative.  Negative for numbness.      Allergies  Review of patient's allergies indicates no known allergies.  Home Medications   Prior to Admission medications    Medication Sig Start Date End Date Taking? Authorizing Provider  amLODipine (NORVASC) 5 MG tablet 1 tablet Daily. 02/14/11   Historical Provider, MD  amphetamine-dextroamphetamine (ADDERALL XR) 30 MG 24 hr capsule  08/25/12   Historical Provider, MD  buPROPion (WELLBUTRIN XL) 300 MG 24 hr tablet 1 tablet Daily. 03/05/11   Historical Provider, MD  Calcium Carb-Cholecalciferol (CALCIUM 1000 + D PO) Take 2 capsules by mouth daily.    Historical Provider, MD  Cholecalciferol (VITAMIN D-3) 5000 UNITS TABS Take 2 capsules by mouth daily.    Historical Provider, MD  diazepam (VALIUM) 5 MG tablet 1 tablet as needed. 11/30/10   Historical Provider, MD  diclofenac (VOLTAREN) 75 MG EC tablet  08/12/12   Historical Provider, MD  Dietary Management Product ([place]) CAPS  08/30/12   Historical Provider, MD  HYDROcodone-acetaminophen (VICODIN) 5-500 MG per tablet 1 tablet as needed. 02/14/11   Historical Provider, MD  indomethacin (INDOCIN SR) 75 MG CR capsule 1-2 tablets Daily. 01/23/11   Historical Provider, MD  metFORMIN (GLUCOPHAGE-XR) 750 MG 24 hr tablet Take 750 mg by mouth daily with breakfast.    Historical Provider, MD  Methylcobalamin (B-12) 5000 MCG TBDP Take 2 tablets by mouth daily.    Historical Provider, MD  Multiple Vitamin (MULTIVITAMIN) tablet Take 1 tablet by mouth daily.    Historical Provider, MD  Omega-3 Fatty Acids ([place])  1200 MG CAPS Take 2 capsules by mouth daily.    Historical Provider, MD  omeprazole (PRILOSEC) 40 MG capsule Take 40 mg by mouth daily.     Historical Provider, MD  oxyCODONE-acetaminophen (PERCOCET) 5-325 MG per tablet 1 tablet as needed. 02/16/11   Historical Provider, MD  tadalafil (CIALIS) 5 MG tablet Take 5 mg by mouth daily as needed.    Historical Provider, MD  Tamsulosin HCl (FLOMAX) 0.4 MG CAPS 1 tablet Daily. 02/08/11   Historical Provider, MD  Testosterone (ANDROGEL) 20.25 MG/1.25GM (1.62%) GEL Place onto the skin.    Historical Provider, MD  thyroid (ARMOUR) 60 MG  tablet Take 60 mg by mouth daily.    Historical Provider, MD  tiZANidine (ZANAFLEX) 4 MG tablet  08/13/12   Historical Provider, MD  [place] Name: Avino Cort.  Takes 1 capsule in am and 1 pill in pm    Historical Provider, MD  VIAGRA 50 MG tablet 1 tablet as needed. 01/09/11   Historical Provider, MD  Zolpidem Tartrate (AMBIEN PO) Take 1 tablet by mouth as needed.      Historical Provider, MD   BP 143/85  Pulse 89  Temp(Src) 98 F (36.7 C) (Oral)  Resp 20  Ht 6' (1.829 m)  Wt 255 lb (115.667 kg)  BMI 34.58 kg/m2  SpO2 97% Physical Exam  Constitutional: He is oriented to person, place, and time. He appears well-developed and well-nourished. No distress.  Cardiovascular: Intact distal pulses.   Musculoskeletal:  Right hip without swelling or discoloration. Range of motion full with pain on hip flexion. No strength deficits. Tender to deep palpation of lateral hip. No reproducible lumbar pain. No sciatic tenderness on the right.   Neurological: He is alert and oriented to person, place, and time.  No ataxia. Fully weight bearing with ambulation.  Skin: Skin is warm and dry. He is not diaphoretic. No erythema.  Psychiatric: He has a normal mood and affect.    ED Course  Procedures (including critical care time) Labs Review Labs Reviewed - No data to display  Imaging Review Dg Hip Complete Right  03/13/2014   CLINICAL DATA:  Right lateral hip pain.  Injury 13 days ago.  EXAM: RIGHT HIP - COMPLETE 2+ VIEW  COMPARISON:  None.  FINDINGS: There is no evidence of hip fracture or dislocation. There is no evidence of arthropathy or other focal bone abnormality.  IMPRESSION: Negative.   Electronically Signed   By: Lajean Manes M.D.   On: 03/13/2014 15:32     EKG Interpretation None      MDM   Final diagnoses:  None    1. Right hip pain  DDx:  Right hip bursitis vs pyriformis strain. He has pain medication and anti-inflammatory medicine that he takes regularly.  Encouraged other supportive care measures, rest, PCP or orthopedic follow up if pain persists.     Dewaine Oats, PA-C 03/13/14 1626

## 2014-03-13 NOTE — ED Notes (Signed)
Pt has hx of DDD. Reports sharp pain in right hip since after July 4. Unable to cross legs completely.

## 2014-03-14 NOTE — ED Provider Notes (Signed)
Medical screening examination/treatment/procedure(s) were performed by non-physician practitioner and as supervising physician I was immediately available for consultation/collaboration.   EKG Interpretation None        Blanchard Kelch, MD 03/14/14 [phone]

## 2014-04-18 ENCOUNTER — Encounter (HOSPITAL_COMMUNITY): Payer: Self-pay | Admitting: Emergency Medicine

## 2014-04-18 ENCOUNTER — Emergency Department (HOSPITAL_COMMUNITY)
Admission: EM | Admit: 2014-04-18 | Discharge: 2014-04-18 | Disposition: A | Discharge status: Home / Self Care | Payer: BC Managed Care – PPO | Attending: Emergency Medicine | Admitting: Emergency Medicine

## 2014-04-18 DIAGNOSIS — G4730 Sleep apnea, unspecified: Secondary | ICD-10-CM | POA: Diagnosis not present

## 2014-04-18 DIAGNOSIS — K219 Gastro-esophageal reflux disease without esophagitis: Secondary | ICD-10-CM | POA: Diagnosis not present

## 2014-04-18 DIAGNOSIS — N4830 Priapism, unspecified: Secondary | ICD-10-CM | POA: Insufficient documentation

## 2014-04-18 DIAGNOSIS — M129 Arthropathy, unspecified: Secondary | ICD-10-CM | POA: Diagnosis not present

## 2014-04-18 DIAGNOSIS — Z7982 Long term (current) use of aspirin: Secondary | ICD-10-CM | POA: Insufficient documentation

## 2014-04-18 DIAGNOSIS — E119 Type 2 diabetes mellitus without complications: Secondary | ICD-10-CM | POA: Insufficient documentation

## 2014-04-18 DIAGNOSIS — Z87891 Personal history of nicotine dependence: Secondary | ICD-10-CM | POA: Diagnosis not present

## 2014-04-18 DIAGNOSIS — F329 Major depressive disorder, single episode, unspecified: Secondary | ICD-10-CM | POA: Insufficient documentation

## 2014-04-18 DIAGNOSIS — F3289 Other specified depressive episodes: Secondary | ICD-10-CM | POA: Diagnosis not present

## 2014-04-18 DIAGNOSIS — Z79899 Other long term (current) drug therapy: Secondary | ICD-10-CM | POA: Diagnosis not present

## 2014-04-18 DIAGNOSIS — I10 Essential (primary) hypertension: Secondary | ICD-10-CM | POA: Insufficient documentation

## 2014-04-18 DIAGNOSIS — Z9981 Dependence on supplemental oxygen: Secondary | ICD-10-CM | POA: Insufficient documentation

## 2014-04-18 MED ORDER — PHENYLEPHRINE 200 MCG/ML FOR PRIAPISM / HYPOTENSION
50.0000 ug | Freq: Once | INTRAMUSCULAR | Status: AC
  Administered 2014-04-18: 50 ug via INTRACAVERNOUS
  Filled 2014-04-18: qty 50

## 2014-04-18 NOTE — ED Notes (Signed)
Pt. Came in with complaint of priapism which started  @0115  this morning after injecting himself prostaglandin E1 , pt. Applied  ice pack for 30 mins , claimed unrelief. Ambulatory upon arrival.

## 2014-04-18 NOTE — ED Provider Notes (Signed)
CSN: [id number]     Arrival date & time 04/18/14  0517 History   First MD Initiated Contact with Patient 04/18/14 0518     Chief Complaint  Patient presents with  . priapism      HPI Pt presents with 4 hour errection after prostaglandin E1 injection. Has tried ice pack with minimal relief. Denies drug use. Called his urologist who recommended management in the ER. No other complaints. Mild pain at this time   Past Medical History  Diagnosis Date  . Arthritis   . Depression   . GERD (gastroesophageal reflux disease)   . 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 Barrett's esophagus   . Testosterone deficiency   . Diabetes   . Hypertension   . Sleep apnea     cpap   Past Surgical History  Procedure Laterality Date  . Back surgery  2009  . Tonsillectomy  1956   Family History  Problem Relation Age of Onset  . Colon cancer Neg Hx   . Stomach cancer Neg Hx    History  Substance Use Topics  . Smoking status: Former Smoker    Quit date: 08/25/2002  . Smokeless tobacco: Never Used  . Alcohol Use: 7.0 - 14.0 oz/week    14-28 drink(s) per week     Comment: varies    Review of Systems  All other systems reviewed and are negative.     Allergies  Review of patient's allergies indicates no known allergies.  Home Medications   Prior to Admission medications   Medication Sig Start Date End Date Taking? Authorizing Provider  amLODipine (NORVASC) 5 MG tablet Take 5 mg by mouth Daily.  02/14/11  Yes Historical Provider, MD  amphetamine-dextroamphetamine (ADDERALL XR) 30 MG 24 hr capsule Take 60 mg by mouth daily.  08/25/12  Yes Historical Provider, MD  aspirin EC 81 MG tablet Take 81 mg by mouth daily.   Yes Historical Provider, MD  buPROPion (WELLBUTRIN XL) 300 MG 24 hr tablet Take 300 mg by mouth Daily.  03/05/11  Yes Historical Provider, MD  Cholecalciferol (VITAMIN D-3) 5000 UNITS TABS Take 5,000 Units by mouth daily.    Yes Historical  Provider, MD  diazepam (VALIUM) 5 MG tablet Take 5 mg by mouth every 6 (six) hours as needed (for spasms).  11/30/10  Yes Historical Provider, MD  diclofenac (VOLTAREN) 75 MG EC tablet Take 75 mg by mouth 2 (two) times daily as needed (for pain.).  08/12/12  Yes Historical Provider, MD  HYDROcodone-acetaminophen (VICODIN) 5-500 MG per tablet Take 1 tablet by mouth every 4 (four) hours as needed (for pain.).  02/14/11  Yes Historical Provider, MD  metFORMIN (GLUCOPHAGE-XR) 750 MG 24 hr tablet Take 750 mg by mouth daily with breakfast.   Yes Historical Provider, MD  MILK THISTLE PO Take 1 capsule by mouth daily.   Yes Historical Provider, MD  Multiple Vitamin (MULTIVITAMIN) tablet Take 1 tablet by mouth daily.   Yes Historical Provider, MD  Multiple Vitamins-Minerals (PRESERVISION AREDS 2 PO) Take 2 capsules by mouth daily.   Yes Historical Provider, MD  Omega-3 Fatty Acids (FISH OIL) 1200 MG CAPS Take 2 capsules by mouth daily.   Yes Historical Provider, MD  omeprazole (PRILOSEC) 40 MG capsule Take 40 mg by mouth daily.    Yes Historical Provider, MD  oxyCODONE-acetaminophen (PERCOCET) 5-325 MG per tablet Take 1 tablet by mouth as needed (for pain.).  02/16/11  Yes  Historical Provider, MD  RAPAFLO 8 MG CAPS capsule Take 8 mg by mouth daily. 04/12/14  Yes Historical Provider, MD  thyroid (ARMOUR) 60 MG tablet Take 60 mg by mouth daily.   Yes Historical Provider, MD  tiZANidine (ZANAFLEX) 4 MG tablet Take 4 mg by mouth 3 (three) times daily as needed (for muscle).  08/13/12  Yes Historical Provider, MD  traMADol (ULTRAM) 50 MG tablet Take 50 mg by mouth every 6 (six) hours as needed (for pain.).  03/16/14  Yes Historical Provider, MD  zolpidem (AMBIEN) 10 MG tablet Take 10 mg by mouth daily as needed. For sleep 03/25/13  Yes Historical Provider, MD   BP 139/74  Pulse 79  Temp(Src) 98 F (36.7 C) (Oral)  Resp 16  Ht 6' (1.829 m)  Wt 260 lb (117.935 kg)  BMI 35.25 kg/m2  SpO2 99% Physical Exam   Nursing note and vitals reviewed. Constitutional: He is oriented to person, place, and time. He appears well-developed and well-nourished.  HENT:  Head: Normocephalic.  Eyes: EOM are normal.  Neck: Normal range of motion.  Pulmonary/Chest: Effort normal.  Abdominal: He exhibits no distension.  Genitourinary:  Erect penis without other abnormality  Musculoskeletal: Normal range of motion.  Neurological: He is alert and oriented to person, place, and time.  Psychiatric: He has a normal mood and affect.    ED Course  Procedures (including critical care time)  PRIAPISM TREATMENT (injection) Patient was prepped and draped in standard sterile fashion Just prior to procedure a timeout was performed Left corpus cavernosum was entered with a 25-gauge syringe Blood was aspirated 1 cc of 200 mcg/ml phenylephrine was injected The patient tolerated the procedure No complications  Labs Review Labs Reviewed - No data to display  Imaging Review No results found.   EKG Interpretation None      MDM   Final diagnoses:  Priapism   Managed with phenylephrine. Tolerated the procedure. Resolution of errection    Hoy Morn, MD 04/18/14 2115

## 2014-04-18 NOTE — ED Notes (Signed)
Bed: WA11 Expected date:  Expected time:  Means of arrival:  Comments: 

## 2014-04-27 ENCOUNTER — Encounter: Payer: Self-pay | Admitting: Gastroenterology

## 2014-06-01 ENCOUNTER — Ambulatory Visit (HOSPITAL_COMMUNITY)
Admission: RE | Admit: 2014-06-01 | Discharge: 2014-06-01 | Disposition: A | Discharge status: Home / Self Care | Payer: BC Managed Care – PPO | Source: Ambulatory Visit | Attending: Cardiovascular Disease | Admitting: Cardiovascular Disease

## 2014-06-01 ENCOUNTER — Other Ambulatory Visit (HOSPITAL_COMMUNITY): Payer: Self-pay | Admitting: Sports Medicine

## 2014-06-01 DIAGNOSIS — M25562 Pain in left knee: Secondary | ICD-10-CM | POA: Insufficient documentation

## 2014-06-01 NOTE — Progress Notes (Signed)
Left Lower Extremity Venous Duplex Completed. No evidence for DVT or SVT. °Brianna L Mazza,RVT °

## 2014-06-08 ENCOUNTER — Telehealth (HOSPITAL_COMMUNITY): Payer: Self-pay | Admitting: *Deleted

## 2014-06-29 ENCOUNTER — Ambulatory Visit (AMBULATORY_SURGERY_CENTER): Payer: Self-pay | Admitting: *Deleted

## 2014-06-29 VITALS — Ht 71.0 in | Wt 246.6 lb

## 2014-06-29 DIAGNOSIS — K2270 Barrett's esophagus without dysplasia: Secondary | ICD-10-CM

## 2014-06-29 NOTE — Progress Notes (Signed)
No allergies to eggs or soy. No problems with anesthesia.  Pt given Emmi instructions for EGD  No oxygen use  No diet drug use  

## 2014-07-13 ENCOUNTER — Encounter: Payer: Self-pay | Admitting: Gastroenterology

## 2014-07-13 ENCOUNTER — Ambulatory Visit (AMBULATORY_SURGERY_CENTER): Payer: BC Managed Care – PPO | Admitting: Gastroenterology

## 2014-07-13 VITALS — BP 134/87 | HR 74 | Temp 97.1°F | Resp 13 | Ht 71.0 in | Wt 246.0 lb

## 2014-07-13 DIAGNOSIS — K2270 Barrett's esophagus without dysplasia: Secondary | ICD-10-CM

## 2014-07-13 LAB — GLUCOSE, CAPILLARY
Glucose-Capillary: 91 mg/dL (ref 70–99)
Glucose-Capillary: 87 mg/dL (ref 70–99)

## 2014-07-13 MED ORDER — SODIUM CHLORIDE 0.9 % IV SOLN: 500.0000 mL | INTRAVENOUS | Status: DC

## 2014-07-13 NOTE — Progress Notes (Signed)
Called to room to assist during endoscopic procedure.  Patient ID and intended procedure confirmed with present staff. Received instructions for my participation in the procedure from the performing physician.  

## 2014-07-13 NOTE — Patient Instructions (Signed)

## 2014-07-13 NOTE — Progress Notes (Signed)
Patient awakening,vss,report to rn 

## 2014-07-13 NOTE — Op Note (Signed)
[facility]  [employer] [place], [id number]   ENDOSCOPY PROCEDURE REPORT  PATIENT: Robert, Castro  MR#: [id number] BIRTHDATE: 1950/10/02 , 10  yrs. old GENDER: male ENDOSCOPIST: [name] Castle, MD REFERRED BY: PROCEDURE DATE:  07/13/2014 PROCEDURE:  EGD w/ biopsy ASA CLASS:     Class II INDICATIONS:  history of Barrett's esophagus. MEDICATIONS: Monitored anesthesia care, Propofol 150 mg IV, and Simethicone 40mg  PO TOPICAL ANESTHETIC:  DESCRIPTION OF PROCEDURE: After the risks benefits and alternatives of the procedure were thoroughly explained, informed consent was obtained.  The [id number] endoscope was introduced through the mouth and advanced to the second portion of the duodenum , Without limitations.  The instrument was slowly withdrawn as the mucosa was fully examined.    ESOPHAGUS: There was short segment Barrett's esophagus without dysplasia found at the gastroesophageal junction.  There was no nodular mucosa noted in the Barrett's segment.  Multiple biopsies were performed.   Except for the findings listed the EGD was otherwise normal.  Retroflexed views revealed no abnormalities. The scope was then withdrawn from the patient and the procedure completed.  COMPLICATIONS: There were no immediate complications.  ENDOSCOPIC IMPRESSION: 1.   There was short segment Barrett's esophagus w/o dysplasia found at the gastroesophageal junction; multiple biopsies were performed 2.   EGD was otherwise normal  RECOMMENDATIONS: Await biopsy results  REPEAT EXAM:  eSigned:  [name] Castle, MD 07/13/2014 1:59 PM    LE:XNTZ Deatra Ina, [place]

## 2014-07-14 ENCOUNTER — Telehealth: Payer: Self-pay

## 2014-07-14 NOTE — Telephone Encounter (Signed)
  Follow up Call-  Call back number 07/13/2014 09/18/2012  Post procedure Call Back phone  # [phone]  Permission to leave phone message Yes Yes     Patient questions:  Do you have a fever, pain , or abdominal swelling? No. Pain Score  0 *  Have you tolerated food without any problems? Yes.    Have you been able to return to your normal activities? Yes.    Do you have any questions about your discharge instructions: Diet   No. Medications  No. Follow up visit  No.  Do you have questions or concerns about your Care? No.  Actions: * If pain score is 4 or above: No action needed, pain <4.

## 2014-07-19 ENCOUNTER — Encounter: Payer: Self-pay | Admitting: Gastroenterology

## 2014-09-27 HISTORY — PX: BACK SURGERY: SHX140

## 2015-03-07 ENCOUNTER — Encounter: Payer: Self-pay | Admitting: Gastroenterology

## 2015-05-03 ENCOUNTER — Encounter: Payer: Self-pay | Admitting: Gastroenterology

## 2015-05-04 ENCOUNTER — Telehealth: Payer: Self-pay | Admitting: Gastroenterology

## 2015-05-04 NOTE — Telephone Encounter (Signed)
Patient reports changes from very loose bowel movements to difficulty having a bowel movement. This occurs with diet changes or medications. Appointment scheduled.

## 2015-05-05 ENCOUNTER — Encounter: Payer: Self-pay | Admitting: Gastroenterology

## 2015-05-05 ENCOUNTER — Other Ambulatory Visit: Payer: Medicare Other

## 2015-05-05 ENCOUNTER — Other Ambulatory Visit: Payer: Self-pay | Admitting: Gastroenterology

## 2015-05-05 ENCOUNTER — Ambulatory Visit (INDEPENDENT_AMBULATORY_CARE_PROVIDER_SITE_OTHER): Payer: Medicare Other | Admitting: Gastroenterology

## 2015-05-05 VITALS — BP 142/90 | HR 86 | Ht 70.0 in | Wt 206.6 lb

## 2015-05-05 DIAGNOSIS — R197 Diarrhea, unspecified: Secondary | ICD-10-CM | POA: Diagnosis not present

## 2015-05-05 NOTE — Patient Instructions (Signed)
Your physician has requested that you go to the basement for  lab work before leaving today.   

## 2015-05-05 NOTE — Progress Notes (Signed)
_                                                                                                                History of Present Illness:  Robert Castro is a 64yo male with h/o Barrett's esophagus, colon polyps, referred at the request of Dr. Bing Matter for evaluation of diarrhea.   For at least 1 year he has had almost daily loose stools consisting of small flakes of stool or water or poorly formed stool.  He may go a day without a bm followed by days of 3-4 bms daily.  At the same time 8-9 times a year he may have a large, impacted stool requiring straining and disimpaction.  Last colonoscopy demonstrated tics and small polyps.  There has been no change in medications or diet.   Past Medical History  Diagnosis Date  . Arthritis   . Depression   . GERD (gastroesophageal reflux disease)   . 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 Barrett's esophagus   . Testosterone deficiency   . Diabetes   . Hypertension   . Sleep apnea     cpap   Past Surgical History  Procedure Laterality Date  . Back surgery  2009  . Tonsillectomy  1956  . Lumbar laminectomy/ decompression with met-rx  2014   family history is negative for Colon cancer and Stomach cancer. Current Outpatient Prescriptions  Medication Sig Dispense Refill  . acetaminophen (TYLENOL) 325 MG tablet Take by mouth.    . ALPRAZolam (XANAX) 1 MG tablet TK 1 T PO  BID  0  . amLODipine (NORVASC) 5 MG tablet Take by mouth Daily.     [name] amphetamine-dextroamphetamine (ADDERALL XR) 30 MG 24 hr capsule Take 60 mg by mouth daily.     [name] aspirin EC 81 MG tablet Take 81 mg by mouth daily.    [name] buPROPion (WELLBUTRIN XL) 300 MG 24 hr tablet Take 300 mg by mouth Daily.     . chlorhexidine (PERIDEX) 0.12 % solution   0  . DHEA 50 MG TABS Take by mouth daily.    . diazepam (VALIUM) 5 MG tablet Take 5 mg by mouth every 6 (six) hours as needed (for spasms).     . diclofenac (VOLTAREN) 75 MG EC  tablet Take 75 mg by mouth 2 (two) times daily as needed (for pain.).     [name] escitalopram (LEXAPRO) 10 MG tablet TK 1 T PO every morning    . gabapentin (NEURONTIN) 300 MG capsule Take 300 mg by mouth 3 (three) times daily.    [name] HYDROcodone-acetaminophen (VICODIN) 5-500 MG per tablet Take 1 tablet by mouth every 4 (four) hours as needed (for pain.).     [name] metFORMIN (GLUCOPHAGE-XR) 750 MG 24 hr tablet Take 750 mg by mouth daily with breakfast.    . MILK THISTLE PO Take 1 capsule by mouth daily.    . Multiple Vitamin (MULTIVITAMIN) tablet Take 1 tablet  by mouth daily.    . Multiple Vitamins-Minerals (PRESERVISION AREDS 2 PO) Take 2 capsules by mouth daily.    . Omega-3 Fatty Acids (FISH OIL) 1200 MG CAPS Take 2 capsules by mouth daily.    [name] omeprazole (PRILOSEC) 20 MG capsule Take 20 mg by mouth daily.    [name] oxyCODONE-acetaminophen (PERCOCET) 5-325 MG per tablet Take 1 tablet by mouth as needed (for pain.).     [name] polyethylene glycol (MIRALAX / GLYCOLAX) packet Take 17 g by mouth daily as needed.    . Probiotic Product (PROBIOTIC ADVANCED PO) Take 1 capsule by mouth daily.    [name] RAPAFLO 8 MG CAPS capsule Take 8 mg by mouth daily.    [name] senna (SENOKOT) 8.6 MG tablet Take by mouth.    . sildenafil (VIAGRA) 100 MG tablet Take 100 mg by mouth daily as needed for erectile dysfunction.    [name] testosterone cypionate (DEPOTESTOTERONE CYPIONATE) 200 MG/ML injection   1  . thyroid (ARMOUR) 60 MG tablet Take 60 mg by mouth daily.    [name] tiZANidine (ZANAFLEX) 4 MG tablet Take 4 mg by mouth 3 (three) times daily as needed (for muscle).     . traMADol (ULTRAM) 50 MG tablet Take 50 mg by mouth every 6 (six) hours as needed (for pain.).     [name] traZODone (DESYREL) 50 MG tablet Take by mouth.    . Wheat Dextrin (BENEFIBER PO) Take by mouth.    . zolpidem (AMBIEN) 10 MG tablet Take 10 mg by mouth daily as needed. For sleep     No current facility-administered medications for this visit.   Allergies as of 05/05/2015  .  (No Known Allergies)    reports that he quit smoking about 12 years ago. He has never used smokeless tobacco. He reports that he drinks about 8.4 - 16.8 oz of alcohol per week. He reports that he does not use illicit drugs.   Review of Systems: Pertinent positive and negative review of systems were noted in the above HPI section. All other review of systems were otherwise negative.  Vital signs were reviewed in today's medical record Physical Exam: General: Well developed , well nourished, no acute distress Skin: anicteric Head: Normocephalic and atraumatic Eyes:  sclerae anicteric, EOMI Ears: Normal auditory acuity Mouth: No deformity or lesions Neck: Supple, no masses or thyromegaly Lymph Nodes: no lymphadenopathy Lungs: Clear throughout to auscultation Heart: Regular rate and rhythm; no murmurs, rubs or bruits Gastroinestinal: Soft, non tender and non distended. No masses, hepatosplenomegaly or hernias noted. Normal Bowel sounds Rectal:deferred Musculoskeletal: Symmetrical with no gross deformities  Skin: No lesions on visible extremities Pulses:  Normal pulses noted Extremities: No clubbing, cyanosis, edema or deformities noted Neurological: Alert oriented x 4, grossly nonfocal Cervical Nodes:  No significant cervical adenopathy Inguinal Nodes: No significant inguinal adenopathy Psychological:  Alert and cooperative. Normal mood and affect  See Assessment and Plan under Problem List

## 2015-05-05 NOTE — Assessment & Plan Note (Addendum)
Chronic diarrhea with intermittent stool impactions.  Diabetes has disappeared with weight loss.  Suspect IBS though meds may be contributing.  Microscopic colitis should be r/oed .   Plan 1.  Stool lactoferrin, C & S, c dificile 2.  If #1 is negative sigmoidoscopy with biopsies  Cc Dr. Deatra Ina

## 2015-05-06 ENCOUNTER — Telehealth: Payer: Self-pay

## 2015-05-06 LAB — CLOSTRIDIUM DIFFICILE BY PCR

## 2015-05-06 NOTE — Telephone Encounter (Signed)
Per pt it's okay to cx he saw Dr. Deatra Ina on 05-05-15

## 2015-05-06 NOTE — Telephone Encounter (Signed)
Called pt to see if he still wanted to keep his appointment with Cecille Rubin since he was seen by Dr. Deatra Ina yesterday. Left vm for pt to call back.

## 2015-05-07 LAB — FECAL LACTOFERRIN, QUANT: Lactoferrin: NEGATIVE

## 2015-05-09 LAB — STOOL CULTURE

## 2015-05-10 ENCOUNTER — Ambulatory Visit: Payer: Self-pay | Admitting: Physician Assistant

## 2015-05-18 ENCOUNTER — Telehealth: Payer: Self-pay | Admitting: Gastroenterology

## 2015-05-19 ENCOUNTER — Other Ambulatory Visit: Payer: Self-pay

## 2015-05-19 DIAGNOSIS — R197 Diarrhea, unspecified: Secondary | ICD-10-CM

## 2015-05-19 NOTE — Telephone Encounter (Signed)
The patient is advised of his negative stool studies. Wants to wait on having a flex sigmoidoscopy. Reports he is doing better. He is not taking as much Miralax, has started a pro-biotic and feels well. No diarrhea.

## 2015-05-19 NOTE — Telephone Encounter (Signed)
Stool studies are negative.  Please schedule sigmoidoscopy

## 2015-05-19 NOTE — Telephone Encounter (Signed)
I have left message for the patient to call back 

## 2015-05-24 ENCOUNTER — Telehealth: Payer: Self-pay | Admitting: Gastroenterology

## 2015-05-24 NOTE — Telephone Encounter (Signed)
Discussed with pt that Dr. Deatra Ina had wanted to do flex sig for biopsies to rule out microscopic colitis. Pt states he is doing pretty good now but if his symptoms get worse he will call back to schedule flex sig.

## 2015-06-01 ENCOUNTER — Encounter (HOSPITAL_COMMUNITY): Admission: RE | Payer: Self-pay | Source: Ambulatory Visit

## 2015-06-01 ENCOUNTER — Ambulatory Visit (HOSPITAL_COMMUNITY): Admission: RE | Admit: 2015-06-01 | Payer: Medicare Other | Source: Ambulatory Visit | Admitting: Gastroenterology

## 2015-06-01 SURGERY — SIGMOIDOSCOPY, FLEXIBLE
Anesthesia: Moderate Sedation

## 2015-06-02 ENCOUNTER — Encounter (INDEPENDENT_AMBULATORY_CARE_PROVIDER_SITE_OTHER): Payer: Medicare Other | Admitting: Ophthalmology

## 2015-06-02 DIAGNOSIS — H43813 Vitreous degeneration, bilateral: Secondary | ICD-10-CM | POA: Diagnosis not present

## 2015-06-02 DIAGNOSIS — H353132 Nonexudative age-related macular degeneration, bilateral, intermediate dry stage: Secondary | ICD-10-CM

## 2015-06-02 DIAGNOSIS — H35033 Hypertensive retinopathy, bilateral: Secondary | ICD-10-CM | POA: Diagnosis not present

## 2015-06-02 DIAGNOSIS — I10 Essential (primary) hypertension: Secondary | ICD-10-CM

## 2015-06-24 ENCOUNTER — Ambulatory Visit: Payer: Self-pay | Admitting: Gastroenterology

## 2015-12-22 ENCOUNTER — Emergency Department (HOSPITAL_COMMUNITY)
Admission: EM | Admit: 2015-12-22 | Discharge: 2015-12-22 | Disposition: A | Discharge status: Home / Self Care | Payer: Medicare Other | Attending: Emergency Medicine | Admitting: Emergency Medicine

## 2015-12-22 ENCOUNTER — Encounter (HOSPITAL_COMMUNITY): Payer: Self-pay | Admitting: Emergency Medicine

## 2015-12-22 DIAGNOSIS — G4730 Sleep apnea, unspecified: Secondary | ICD-10-CM | POA: Insufficient documentation

## 2015-12-22 DIAGNOSIS — Z0189 Encounter for other specified special examinations: Secondary | ICD-10-CM

## 2015-12-22 DIAGNOSIS — M1990 Unspecified osteoarthritis, unspecified site: Secondary | ICD-10-CM | POA: Insufficient documentation

## 2015-12-22 DIAGNOSIS — Z7982 Long term (current) use of aspirin: Secondary | ICD-10-CM | POA: Diagnosis not present

## 2015-12-22 DIAGNOSIS — K219 Gastro-esophageal reflux disease without esophagitis: Secondary | ICD-10-CM | POA: Insufficient documentation

## 2015-12-22 DIAGNOSIS — Z7984 Long term (current) use of oral hypoglycemic drugs: Secondary | ICD-10-CM | POA: Diagnosis not present

## 2015-12-22 DIAGNOSIS — I10 Essential (primary) hypertension: Secondary | ICD-10-CM | POA: Diagnosis not present

## 2015-12-22 DIAGNOSIS — Z79891 Long term (current) use of opiate analgesic: Secondary | ICD-10-CM | POA: Diagnosis not present

## 2015-12-22 DIAGNOSIS — Z79899 Other long term (current) drug therapy: Secondary | ICD-10-CM | POA: Insufficient documentation

## 2015-12-22 DIAGNOSIS — Z87891 Personal history of nicotine dependence: Secondary | ICD-10-CM | POA: Diagnosis not present

## 2015-12-22 DIAGNOSIS — K2270 Barrett's esophagus without dysplasia: Secondary | ICD-10-CM | POA: Diagnosis not present

## 2015-12-22 DIAGNOSIS — Z791 Long term (current) use of non-steroidal anti-inflammatories (NSAID): Secondary | ICD-10-CM | POA: Insufficient documentation

## 2015-12-22 DIAGNOSIS — F329 Major depressive disorder, single episode, unspecified: Secondary | ICD-10-CM | POA: Diagnosis not present

## 2015-12-22 DIAGNOSIS — Z0000 Encounter for general adult medical examination without abnormal findings: Secondary | ICD-10-CM | POA: Insufficient documentation

## 2015-12-22 DIAGNOSIS — E119 Type 2 diabetes mellitus without complications: Secondary | ICD-10-CM | POA: Diagnosis not present

## 2015-12-22 LAB — ETHANOL: ALCOHOL ETHYL (B): 70 mg/dL — AB (ref <5)

## 2015-12-22 NOTE — Discharge Instructions (Signed)
Results for orders placed or performed during the hospital encounter of 12/22/15  Ethanol  Result Value Ref Range   Alcohol, Ethyl (B) 70 (H) <5 mg/dL

## 2015-12-22 NOTE — ED Provider Notes (Signed)
CSN: [id number]     Arrival date & time 12/22/15  0241 History   First MD Initiated Contact with Patient 12/22/15 0302     Chief Complaint  Patient presents with  . Labs Only     (Consider location/radiation/quality/duration/timing/severity/associated sxs/prior Treatment) HPI Comments: The patient presents with request for alcohol level. He states he was charged with a DUI earlier this evening and wanted to have a lab test to corroborate the charge.   The history is provided by the patient. No language interpreter was used.    Past Medical History  Diagnosis Date  . Arthritis   . Depression   . GERD (gastroesophageal reflux disease)   . 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 Barrett's esophagus   . Testosterone deficiency   . Diabetes ([place])   . Hypertension   . Sleep apnea     cpap   Past Surgical History  Procedure Laterality Date  . Back surgery  2009  . Tonsillectomy  1956  . Lumbar laminectomy/ decompression with met-rx  2014   Family History  Problem Relation Age of Onset  . Colon cancer Neg Hx   . Stomach cancer Neg Hx   . Atrial fibrillation Brother    Social History  Substance Use Topics  . Smoking status: Former Smoker    Quit date: 08/25/2002  . Smokeless tobacco: Never Used  . Alcohol Use: 8.4 - 16.8 oz/week    14-28 Standard drinks or equivalent per week     Comment: varies    Review of Systems  Constitutional: Negative for fever and chills.  HENT: Negative.   Respiratory: Negative.   Cardiovascular: Negative.   Gastrointestinal: Negative.   Musculoskeletal: Negative.   Skin: Negative.   Neurological: Negative.       Allergies  Review of patient's allergies indicates no known allergies.  Home Medications   Prior to Admission medications   Medication Sig Start Date End Date Taking? Authorizing Provider  ALPRAZolam Duanne Moron) 1 MG tablet TK 1 T PO  BID 03/30/15   Historical Provider, MD  amLODipine  (NORVASC) 5 MG tablet Take by mouth Daily.  02/14/11   Historical Provider, MD  amphetamine-dextroamphetamine (ADDERALL XR) 30 MG 24 hr capsule Take 60 mg by mouth daily.  08/25/12   Historical Provider, MD  aspirin EC 81 MG tablet Take 81 mg by mouth daily.    Historical Provider, MD  buPROPion (WELLBUTRIN XL) 300 MG 24 hr tablet Take 300 mg by mouth Daily.  03/05/11   Historical Provider, MD  chlorhexidine (PERIDEX) 0.12 % solution  06/24/14   Historical Provider, MD  DHEA 50 MG TABS Take by mouth daily.    Historical Provider, MD  diazepam (VALIUM) 5 MG tablet Take 5 mg by mouth every 6 (six) hours as needed (for spasms).  11/30/10   Historical Provider, MD  diclofenac (VOLTAREN) 75 MG EC tablet Take 75 mg by mouth 2 (two) times daily as needed (for pain.).  08/12/12   Historical Provider, MD  escitalopram (LEXAPRO) 10 MG tablet TK 1 T PO every morning 03/28/15   Historical Provider, MD  gabapentin (NEURONTIN) 300 MG capsule Take 300 mg by mouth 3 (three) times daily.    Historical Provider, MD  HYDROcodone-acetaminophen (VICODIN) 5-500 MG per tablet Take 1 tablet by mouth every 4 (four) hours as needed (for pain.).  02/14/11   Historical Provider, MD  metFORMIN (GLUCOPHAGE-XR) 750 MG 24 hr tablet Take 750  mg by mouth daily with breakfast.    Historical Provider, MD  MILK THISTLE PO Take 1 capsule by mouth daily.    Historical Provider, MD  Multiple Vitamin (MULTIVITAMIN) tablet Take 1 tablet by mouth daily.    Historical Provider, MD  Multiple Vitamins-Minerals (PRESERVISION AREDS 2 PO) Take 2 capsules by mouth daily.    Historical Provider, MD  Omega-3 Fatty Acids (FISH OIL) 1200 MG CAPS Take 2 capsules by mouth daily.    Historical Provider, MD  omeprazole (PRILOSEC) 20 MG capsule Take 20 mg by mouth daily.    Historical Provider, MD  oxyCODONE-acetaminophen (PERCOCET) 5-325 MG per tablet Take 1 tablet by mouth as needed (for pain.).  02/16/11   Historical Provider, MD  polyethylene glycol (MIRALAX /  GLYCOLAX) packet Take 17 g by mouth daily as needed.    Historical Provider, MD  Probiotic Product (PROBIOTIC ADVANCED PO) Take 1 capsule by mouth daily.    Historical Provider, MD  RAPAFLO 8 MG CAPS capsule Take 8 mg by mouth daily. 04/12/14   Historical Provider, MD  sildenafil (VIAGRA) 100 MG tablet Take 100 mg by mouth daily as needed for erectile dysfunction.    Historical Provider, MD  testosterone cypionate (DEPOTESTOTERONE CYPIONATE) 200 MG/ML injection  06/02/14   Historical Provider, MD  thyroid (ARMOUR) 60 MG tablet Take 60 mg by mouth daily.    Historical Provider, MD  tiZANidine (ZANAFLEX) 4 MG tablet Take 4 mg by mouth 3 (three) times daily as needed (for muscle).  08/13/12   Historical Provider, MD  traMADol (ULTRAM) 50 MG tablet Take 50 mg by mouth every 6 (six) hours as needed (for pain.).  03/16/14   Historical Provider, MD  traZODone (DESYREL) 50 MG tablet Take by mouth.    Historical Provider, MD  Wheat Dextrin (BENEFIBER PO) Take by mouth.    Historical Provider, MD  zolpidem (AMBIEN) 10 MG tablet Take 10 mg by mouth daily as needed. For sleep 03/25/13   Historical Provider, MD   BP 149/98 mmHg  Pulse 86  Temp(Src) 97.5 F (36.4 C) (Oral)  Resp 20  SpO2 96% Physical Exam  Constitutional: He is oriented to person, place, and time. He appears well-developed and well-nourished.  The patient does not appear acutely intoxicated.   Neck: Normal range of motion.  Pulmonary/Chest: Effort normal.  Musculoskeletal: Normal range of motion.  Neurological: He is alert and oriented to person, place, and time.  Skin: Skin is warm and dry.  Psychiatric: He has a normal mood and affect.    ED Course  Procedures (including critical care time) Labs Review Labs Reviewed  ETHANOL - Abnormal; Notable for the following:    Alcohol, Ethyl (B) 70 (*)    All other components within normal limits    Imaging Review No results found. I have personally reviewed and evaluated these images  and lab results as part of my medical decision-making.   EKG Interpretation None      MDM   Final diagnoses:  None    1. Visit for lab test  Discussed results with patient. Stable for discharge.     Charlann Lange, PA-C 12/22/15 [place], MD 12/22/15 [phone]

## 2015-12-22 NOTE — ED Notes (Signed)
Pt states he wants an alcohol level drawn

## 2016-02-06 ENCOUNTER — Ambulatory Visit (INDEPENDENT_AMBULATORY_CARE_PROVIDER_SITE_OTHER): Payer: Medicare Other | Admitting: Otolaryngology

## 2016-03-06 ENCOUNTER — Telehealth (HOSPITAL_BASED_OUTPATIENT_CLINIC_OR_DEPARTMENT_OTHER): Payer: Self-pay | Admitting: Emergency Medicine

## 2016-07-13 ENCOUNTER — Other Ambulatory Visit: Payer: Self-pay | Admitting: Cardiology

## 2016-07-13 DIAGNOSIS — Z136 Encounter for screening for cardiovascular disorders: Secondary | ICD-10-CM

## 2016-07-13 DIAGNOSIS — N529 Male erectile dysfunction, unspecified: Secondary | ICD-10-CM

## 2016-07-13 DIAGNOSIS — R935 Abnormal findings on diagnostic imaging of other abdominal regions, including retroperitoneum: Secondary | ICD-10-CM

## 2016-07-23 ENCOUNTER — Encounter (INDEPENDENT_AMBULATORY_CARE_PROVIDER_SITE_OTHER): Payer: Medicare Other | Admitting: Ophthalmology

## 2016-07-23 DIAGNOSIS — H353132 Nonexudative age-related macular degeneration, bilateral, intermediate dry stage: Secondary | ICD-10-CM | POA: Diagnosis not present

## 2016-07-23 DIAGNOSIS — H2513 Age-related nuclear cataract, bilateral: Secondary | ICD-10-CM

## 2016-07-23 DIAGNOSIS — I10 Essential (primary) hypertension: Secondary | ICD-10-CM | POA: Diagnosis not present

## 2016-07-23 DIAGNOSIS — H43813 Vitreous degeneration, bilateral: Secondary | ICD-10-CM | POA: Diagnosis not present

## 2016-07-23 DIAGNOSIS — H35033 Hypertensive retinopathy, bilateral: Secondary | ICD-10-CM | POA: Diagnosis not present

## 2016-07-24 ENCOUNTER — Ambulatory Visit
Admission: RE | Admit: 2016-07-24 | Discharge: 2016-07-24 | Disposition: A | Discharge status: Home / Self Care | Payer: Medicare Other | Source: Ambulatory Visit | Attending: Cardiology | Admitting: Cardiology

## 2016-07-24 DIAGNOSIS — N529 Male erectile dysfunction, unspecified: Secondary | ICD-10-CM

## 2016-07-24 DIAGNOSIS — Z136 Encounter for screening for cardiovascular disorders: Secondary | ICD-10-CM

## 2016-07-24 DIAGNOSIS — R935 Abnormal findings on diagnostic imaging of other abdominal regions, including retroperitoneum: Secondary | ICD-10-CM

## 2016-07-24 MED ORDER — IOPAMIDOL (ISOVUE-370) INJECTION 76%
100.0000 mL | Freq: Once | INTRAVENOUS | Status: AC | PRN
  Administered 2016-07-24: 100 mL via INTRAVENOUS

## 2016-08-29 ENCOUNTER — Other Ambulatory Visit: Payer: Self-pay | Admitting: Family Medicine

## 2016-08-29 DIAGNOSIS — M7989 Other specified soft tissue disorders: Secondary | ICD-10-CM

## 2016-08-30 ENCOUNTER — Ambulatory Visit
Admission: RE | Admit: 2016-08-30 | Discharge: 2016-08-30 | Disposition: A | Discharge status: Home / Self Care | Payer: Medicare Other | Source: Ambulatory Visit | Attending: Family Medicine | Admitting: Family Medicine

## 2016-08-30 DIAGNOSIS — M7989 Other specified soft tissue disorders: Secondary | ICD-10-CM

## 2016-11-20 ENCOUNTER — Other Ambulatory Visit: Payer: Self-pay | Admitting: Family Medicine

## 2016-11-20 ENCOUNTER — Ambulatory Visit
Admission: RE | Admit: 2016-11-20 | Discharge: 2016-11-20 | Disposition: A | Discharge status: Home / Self Care | Payer: Medicare Other | Source: Ambulatory Visit | Attending: Family Medicine | Admitting: Family Medicine

## 2016-11-20 DIAGNOSIS — M7989 Other specified soft tissue disorders: Secondary | ICD-10-CM

## 2016-11-20 DIAGNOSIS — M79605 Pain in left leg: Secondary | ICD-10-CM

## 2016-11-27 ENCOUNTER — Other Ambulatory Visit: Payer: Self-pay | Admitting: Family Medicine

## 2016-11-27 DIAGNOSIS — M545 Low back pain: Secondary | ICD-10-CM

## 2016-11-30 ENCOUNTER — Other Ambulatory Visit: Payer: Self-pay | Admitting: Family Medicine

## 2016-11-30 DIAGNOSIS — M79605 Pain in left leg: Secondary | ICD-10-CM

## 2016-12-12 ENCOUNTER — Ambulatory Visit
Admission: RE | Admit: 2016-12-12 | Discharge: 2016-12-12 | Disposition: A | Discharge status: Home / Self Care | Payer: Medicare Other | Source: Ambulatory Visit | Attending: Family Medicine | Admitting: Family Medicine

## 2016-12-12 DIAGNOSIS — M79605 Pain in left leg: Secondary | ICD-10-CM

## 2016-12-25 HISTORY — PX: CATARACT EXTRACTION, BILATERAL: SHX1313

## 2017-05-22 ENCOUNTER — Encounter: Payer: Self-pay | Admitting: Gastroenterology

## 2017-06-04 ENCOUNTER — Encounter: Payer: Self-pay | Admitting: Gastroenterology

## 2017-06-19 ENCOUNTER — Other Ambulatory Visit: Payer: Self-pay | Admitting: Family Medicine

## 2017-06-19 DIAGNOSIS — R609 Edema, unspecified: Secondary | ICD-10-CM

## 2017-06-20 ENCOUNTER — Ambulatory Visit
Admission: RE | Admit: 2017-06-20 | Discharge: 2017-06-20 | Disposition: A | Discharge status: Home / Self Care | Payer: Medicare Other | Source: Ambulatory Visit | Attending: Family Medicine | Admitting: Family Medicine

## 2017-06-20 DIAGNOSIS — R609 Edema, unspecified: Secondary | ICD-10-CM

## 2017-06-24 ENCOUNTER — Other Ambulatory Visit: Payer: Medicare Other

## 2017-07-08 ENCOUNTER — Ambulatory Visit (AMBULATORY_SURGERY_CENTER): Payer: Self-pay | Admitting: *Deleted

## 2017-07-08 ENCOUNTER — Other Ambulatory Visit: Payer: Self-pay

## 2017-07-08 VITALS — Ht 72.0 in | Wt 257.8 lb

## 2017-07-08 DIAGNOSIS — K2270 Barrett's esophagus without dysplasia: Secondary | ICD-10-CM

## 2017-07-08 NOTE — Progress Notes (Signed)
No egg or soy allergy known to patient  No issues with past sedation with any surgeries  or procedures, no intubation problems  No diet pills per patient No home 02 use per patient  No blood thinners per patient  Pt denies issues with constipation  No A fib or A flutter  EMMI video sent to pt's e mail pt declined   

## 2017-07-15 ENCOUNTER — Encounter: Payer: Self-pay | Admitting: Gastroenterology

## 2017-07-15 ENCOUNTER — Telehealth: Payer: Self-pay

## 2017-07-15 ENCOUNTER — Ambulatory Visit (AMBULATORY_SURGERY_CENTER): Payer: Medicare Other | Admitting: Gastroenterology

## 2017-07-15 VITALS — BP 150/90 | HR 81 | Temp 98.9°F | Resp 15 | Ht 72.0 in | Wt 257.0 lb

## 2017-07-15 DIAGNOSIS — D132 Benign neoplasm of duodenum: Secondary | ICD-10-CM | POA: Diagnosis not present

## 2017-07-15 DIAGNOSIS — K2270 Barrett's esophagus without dysplasia: Secondary | ICD-10-CM

## 2017-07-15 DIAGNOSIS — K317 Polyp of stomach and duodenum: Secondary | ICD-10-CM | POA: Diagnosis not present

## 2017-07-15 MED ORDER — SODIUM CHLORIDE 0.9 % IV SOLN: 500.0000 mL | INTRAVENOUS | Status: DC

## 2017-07-15 NOTE — Telephone Encounter (Signed)
Will contact pt in 3 weeks to have repeat lab work done according to note from Dr. Havery Moros from 07-15-17.

## 2017-07-15 NOTE — Progress Notes (Signed)
Pt signed release to obtain records from [place], [place] for LFT's.  The office will call pt with an appointment after Dr. Liam Rogers the blood work.  No problems noted in the recovery room. maw

## 2017-07-15 NOTE — Op Note (Signed)
[facility] Patient Name: Robert Castro Procedure Date: 07/15/2017 2:06 PM MRN: [id number] Endoscopist: Remo Lipps P. Mikal Wisman MD, MD Age: 66 Referring MD:  Date of Birth: Jan 15, 1951 Gender: Male Account #: [id number] Procedure:                Upper GI endoscopy Indications:              Follow-up of Barrett's esophagus - short segment,                            on omeprazole with good control of symptoms Medicines:                Monitored Anesthesia Care Procedure:                Pre-Anesthesia Assessment:                           - Prior to the procedure, a History and Physical                            was performed, and patient medications and                            allergies were reviewed. The patient's tolerance of                            previous anesthesia was also reviewed. The risks                            and benefits of the procedure and the sedation                            options and risks were discussed with the patient.                            All questions were answered, and informed consent                            was obtained. Prior Anticoagulants: The patient has                            taken no previous anticoagulant or antiplatelet                            agents. ASA Grade Assessment: II - A patient with                            mild systemic disease. After reviewing the risks                            and benefits, the patient was deemed in                            satisfactory condition to undergo the procedure.  After obtaining informed consent, the endoscope was                            passed under direct vision. Throughout the                            procedure, the patient's blood pressure, pulse, and                            oxygen saturations were monitored continuously. The                            Endoscope was introduced through the mouth, and   advanced to the second part of duodenum. The upper                            GI endoscopy was accomplished without difficulty.                            The patient tolerated the procedure well. Scope In: Scope Out: Findings:                 Esophagogastric landmarks were identified: the                            Z-line was found at 42 cm, the gastroesophageal                            junction was found at 42 cm and the upper extent of                            the gastric folds was found at 42 cm from the                            incisors.                           The Z-line was irregular with islands of salmon                            colorec mucosa extending up around 1cm from the                            SCJ. No nodularity appreciated. Biopsies were taken                            with a cold forceps for histology.                           The exam of the esophagus was otherwise normal.                           Multiple 3 to 5 mm sessile polyps were found in the  gastric antrum. Biopsies were taken with a cold                            forceps for histology.                           The exam of the stomach was otherwise normal.                           A single 8 to 10 mm sessile polyp was found in the                            second portion of the duodenum. Biopsies were taken                            with a cold forceps for histology.                           The exam of the duodenum was otherwise normal. Complications:            No immediate complications. Estimated blood loss:                            Minimal. Estimated Blood Loss:     Estimated blood loss was minimal. Impression:               - Esophagogastric landmarks identified.                           - Z-line irregular, 42 cm from the incisors.                            Biopsied.                           - Normal esophagus                           - Multiple benign  appearing gastric polyps.                            Biopsied.                           - A single duodenal polyp, suspicious for an                            adenoma. Biopsied. Recommendation:           - Patient has a contact number available for                            emergencies. The signs and symptoms of potential                            delayed complications were discussed with the  patient. Return to normal activities tomorrow.                            Written discharge instructions were provided to the                            patient.                           - Resume previous diet.                           - Continue present medications.                           - Await pathology results.                           - If duodenal polyp is adenomatous, follow up EGD                            will be needed at the hospital for removal. Remo Lipps P. Trino Higinbotham MD, MD 07/15/2017 2:15:23 PM This report has been signed electronically.

## 2017-07-15 NOTE — Telephone Encounter (Signed)
-----   Message from Yetta Flock, MD sent at 07/15/2017  4:46 PM EST ----- Regarding: RE: lab results in [place] Thanks much Jan, I didn't see it before, but didn't realize I had to click on each center for their labs, and see it now.  He had a mild elevation in ALT and AST.  I would like him to repeat LFTs in 3-4 weeks, along with hepatitis B surface antigen and hepatitis C antibody.  If his LFTs remain elevated on the next blood draw, he will need additional lab work and imaging.  Can you please place this message into a telephone note so it can be seen in his record?  Thanks very much! Dr. Loni Muse    ----- Message ----- From: Roetta Sessions, CMA Sent: 07/15/2017   2:40 PM To: Yetta Flock, MD Subject: lab results in [facility]                  Dr. Havery Moros, I looked in [place] and there are lab results from 07/08/2017, 11/20/2016, 07/11/16 and 11/04/15 under [place], Lab Results and then under Chemistry Panel.  I 'think' it contains values that are considered Liver functions. I can send the Release of Medical Records if it doesn't contain all you need. Please let me know. Thanks, Jan

## 2017-07-15 NOTE — Progress Notes (Signed)
Called to room to assist during endoscopic procedure.  Patient ID and intended procedure confirmed with present staff. Received instructions for my participation in the procedure from the performing physician.  

## 2017-07-15 NOTE — Progress Notes (Signed)
To recovery, report to RN, VSS. 

## 2017-07-15 NOTE — Patient Instructions (Signed)
YOU HAD AN ENDOSCOPIC PROCEDURE TODAY AT [facility]:   Refer to the procedure report that was given to you for any specific questions about what was found during the examination.  If the procedure report does not answer your questions, please call your gastroenterologist to clarify.  If you requested that your care partner not be given the details of your procedure findings, then the procedure report has been included in a sealed envelope for you to review at your convenience later.  YOU SHOULD EXPECT: Some feelings of bloating in the abdomen. Passage of more gas than usual.  Walking can help get rid of the air that was put into your GI tract during the procedure and reduce the bloating. If you had a lower endoscopy (such as a colonoscopy or flexible sigmoidoscopy) you may notice spotting of blood in your stool or on the toilet paper. If you underwent a bowel prep for your procedure, you may not have a normal bowel movement for a few days.  Please Note:  You might notice some irritation and congestion in your nose or some drainage.  This is from the oxygen used during your procedure.  There is no need for concern and it should clear up in a day or so.  SYMPTOMS TO REPORT IMMEDIATELY:    Following upper endoscopy (EGD)  Vomiting of blood or coffee ground material  New chest pain or pain under the shoulder blades  Painful or persistently difficult swallowing  New shortness of breath  Fever of 100F or higher  Black, tarry-looking stools  For urgent or emergent issues, a gastroenterologist can be reached at any hour by calling [phone].   DIET:  We do recommend a small meal at first, but then you may proceed to your regular diet.  Drink plenty of fluids but you should avoid alcoholic beverages for 24 hours.  ACTIVITY:  You should plan to take it easy for the rest of today and you should NOT DRIVE or use heavy machinery until tomorrow (because of the sedation medicines used  during the test).    FOLLOW UP: Our staff will call the number listed on your records the next business day following your procedure to check on you and address any questions or concerns that you may have regarding the information given to you following your procedure. If we do not reach you, we will leave a message.  However, if you are feeling well and you are not experiencing any problems, there is no need to return our call.  We will assume that you have returned to your regular daily activities without incident.  If any biopsies were taken you will be contacted by phone or by letter within the next 1-3 weeks.  Please call us at [phone] if you have not heard about the biopsies in 3 weeks.    SIGNATURES/CONFIDENTIALITY: You and/or your care partner have signed paperwork which will be entered into your electronic medical record.  These signatures attest to the fact that that the information above on your After Visit Summary has been reviewed and is understood.  Full responsibility of the confidentiality of this discharge information lies with you and/or your care-partner.    Handouts were given to your care partner on barrett's esophagus. Your blood sugar was 175 in the recovery room. You may resume your current medications today. Await biopsy results. If duodenal polyps is adenomatous, follow up EGD will be needed at the hospital for removal. Pt signed release  of record from [facility], [place] for LFT blood work.  After reviewing blood work  call pt and set up appointment to see Dr. Havery Moros in the office. Please call if any questions or concerns.

## 2017-07-16 ENCOUNTER — Telehealth: Payer: Self-pay | Admitting: *Deleted

## 2017-07-16 NOTE — Telephone Encounter (Signed)
  Follow up Call-  Call back number 07/15/2017  Post procedure Call Back phone  # [phone]  Permission to leave phone message Yes  Some recent data might be hidden     Patient questions:  Do you have a fever, pain , or abdominal swelling? No. Pain Score  0 *  Have you tolerated food without any problems? Yes.    Have you been able to return to your normal activities? Yes.    Do you have any questions about your discharge instructions: Diet   No. Medications  No. Follow up visit  No.  Do you have questions or concerns about your Care? No.  Actions: * If pain score is 4 or above: No action needed, pain <4.

## 2017-07-16 NOTE — Telephone Encounter (Signed)
Thanks Jan 

## 2017-07-25 ENCOUNTER — Other Ambulatory Visit: Payer: Self-pay

## 2017-07-25 DIAGNOSIS — D132 Benign neoplasm of duodenum: Secondary | ICD-10-CM

## 2017-08-07 ENCOUNTER — Telehealth: Payer: Self-pay

## 2017-08-07 DIAGNOSIS — R7989 Other specified abnormal findings of blood chemistry: Secondary | ICD-10-CM

## 2017-08-07 DIAGNOSIS — R945 Abnormal results of liver function studies: Principal | ICD-10-CM

## 2017-08-07 NOTE — Telephone Encounter (Signed)
-----   Message from Roetta Sessions, [place] sent at 07/15/2017  5:08 PM EST ----- Regarding: repeat labwork needed On 11-19: from [facility]  He had a mild elevation in ALT and AST.  I would like him to repeat LFTs in 3-4 weeks, along with hepatitis B surface antigen and hepatitis C antibody.   If his LFTs remain elevated on the next blood draw, he will need additional lab work and imaging. [name]

## 2017-08-07 NOTE — Telephone Encounter (Signed)
Called and LM for pt to call me back regarding needed labwork.  Lab orders have been entered.

## 2017-08-08 NOTE — Telephone Encounter (Signed)
Lm for pt to call me back regarding having lab work done next week. Left details regarding hours lab is open.

## 2017-08-09 ENCOUNTER — Telehealth: Payer: Self-pay

## 2017-08-09 NOTE — Telephone Encounter (Signed)
Sent letter to pt to go to lab for labwork next week, 08-12-17 for LFT, hep B surface antigen and Hep C antibody.

## 2017-08-09 NOTE — Telephone Encounter (Signed)
Pt returned my call about needing lab work next week, week of 08-12-17. I instructed him in terms of the hours of the lab and answered all his questions. He expressed understanding and asked that when we call back with results that we should feel free to leave all details on his voice mail as he is the only person that has access.

## 2017-08-13 ENCOUNTER — Other Ambulatory Visit (INDEPENDENT_AMBULATORY_CARE_PROVIDER_SITE_OTHER): Payer: Medicare Other

## 2017-08-13 DIAGNOSIS — R945 Abnormal results of liver function studies: Secondary | ICD-10-CM | POA: Diagnosis not present

## 2017-08-13 DIAGNOSIS — R7989 Other specified abnormal findings of blood chemistry: Secondary | ICD-10-CM

## 2017-08-13 LAB — HEPATIC FUNCTION PANEL
ALT: 59 U/L — AB (ref 0–53)
AST: 43 U/L — AB (ref 0–37)
Albumin: 4.5 g/dL (ref 3.5–5.2)
Alkaline Phosphatase: 95 U/L (ref 39–117)
BILIRUBIN DIRECT: 0.2 mg/dL (ref 0.0–0.3)
BILIRUBIN TOTAL: 0.9 mg/dL (ref 0.2–1.2)
Total Protein: 7 g/dL (ref 6.0–8.3)

## 2017-08-14 ENCOUNTER — Ambulatory Visit: Payer: Medicare Other | Admitting: Neurology

## 2017-08-14 LAB — HEPATITIS B SURFACE ANTIGEN: Hepatitis B Surface Ag: NONREACTIVE

## 2017-08-14 LAB — HEPATITIS C ANTIBODY
Hepatitis C Ab: NONREACTIVE
SIGNAL TO CUT-OFF: 0.01 (ref <1.00)

## 2017-08-15 ENCOUNTER — Other Ambulatory Visit: Payer: Self-pay

## 2017-08-15 DIAGNOSIS — R945 Abnormal results of liver function studies: Secondary | ICD-10-CM

## 2017-08-15 DIAGNOSIS — R7989 Other specified abnormal findings of blood chemistry: Secondary | ICD-10-CM

## 2017-08-15 DIAGNOSIS — K760 Fatty (change of) liver, not elsewhere classified: Secondary | ICD-10-CM

## 2017-08-28 ENCOUNTER — Other Ambulatory Visit (INDEPENDENT_AMBULATORY_CARE_PROVIDER_SITE_OTHER): Payer: Medicare Other

## 2017-08-28 DIAGNOSIS — R945 Abnormal results of liver function studies: Secondary | ICD-10-CM | POA: Diagnosis not present

## 2017-08-28 DIAGNOSIS — K760 Fatty (change of) liver, not elsewhere classified: Secondary | ICD-10-CM

## 2017-08-28 DIAGNOSIS — R7989 Other specified abnormal findings of blood chemistry: Secondary | ICD-10-CM

## 2017-08-28 LAB — FERRITIN: Ferritin: 69.7 ng/mL (ref 22.0–322.0)

## 2017-08-28 NOTE — Addendum Note (Signed)
Addended by: Trenda Moots on: 10/29/350 04:22 PM   Modules accepted: Orders

## 2017-08-29 LAB — IRON AND TIBC
IRON: 106 ug/dL (ref 38–169)
Iron Saturation: 29 % (ref 15–55)
Total Iron Binding Capacity: 361 ug/dL (ref 250–450)
UIBC: 255 ug/dL (ref 111–343)

## 2017-08-30 LAB — HEPATITIS B SURFACE ANTIBODY,QUALITATIVE: Hep B S Ab: NONREACTIVE

## 2017-08-30 LAB — HEPATITIS A ANTIBODY, TOTAL: Hepatitis A AB,Total: NONREACTIVE

## 2017-08-30 LAB — IGG: IgG (Immunoglobin G), Serum: 552 mg/dL — ABNORMAL LOW (ref 694–1618)

## 2017-08-30 LAB — ANA: ANA: NEGATIVE

## 2017-08-30 LAB — ANTI-SMOOTH MUSCLE ANTIBODY, IGG: Actin (Smooth Muscle) Antibody (IGG): <20 U (ref <20)

## 2017-09-06 ENCOUNTER — Encounter (HOSPITAL_COMMUNITY): Payer: Self-pay

## 2017-09-06 ENCOUNTER — Other Ambulatory Visit: Payer: Self-pay

## 2017-09-10 ENCOUNTER — Telehealth: Payer: Self-pay

## 2017-09-10 NOTE — Telephone Encounter (Signed)
Called pt and let him know we have an opening for an appt for tomorrow, 09-11-17 at 9:30am or 10:45am. Told the pt he must call first thing in the morning to confirm appt time and needs to arrive 15 mins prior to appt time.

## 2017-09-11 ENCOUNTER — Ambulatory Visit (HOSPITAL_COMMUNITY)
Admission: RE | Admit: 2017-09-11 | Discharge: 2017-09-11 | Disposition: A | Discharge status: Home / Self Care | Payer: Medicare Other | Source: Ambulatory Visit | Attending: Gastroenterology | Admitting: Gastroenterology

## 2017-09-11 DIAGNOSIS — R945 Abnormal results of liver function studies: Secondary | ICD-10-CM | POA: Insufficient documentation

## 2017-09-11 DIAGNOSIS — K8020 Calculus of gallbladder without cholecystitis without obstruction: Secondary | ICD-10-CM | POA: Diagnosis not present

## 2017-09-11 DIAGNOSIS — K760 Fatty (change of) liver, not elsewhere classified: Secondary | ICD-10-CM

## 2017-09-11 DIAGNOSIS — R7989 Other specified abnormal findings of blood chemistry: Secondary | ICD-10-CM

## 2017-09-12 ENCOUNTER — Encounter: Payer: Self-pay | Admitting: Neurology

## 2017-09-12 ENCOUNTER — Ambulatory Visit (INDEPENDENT_AMBULATORY_CARE_PROVIDER_SITE_OTHER): Payer: Medicare Other | Admitting: Neurology

## 2017-09-12 VITALS — BP 142/98 | HR 108 | Ht 72.0 in | Wt 265.0 lb

## 2017-09-12 DIAGNOSIS — R2689 Other abnormalities of gait and mobility: Secondary | ICD-10-CM

## 2017-09-12 DIAGNOSIS — R000 Tachycardia, unspecified: Secondary | ICD-10-CM

## 2017-09-12 DIAGNOSIS — R251 Tremor, unspecified: Secondary | ICD-10-CM | POA: Diagnosis not present

## 2017-09-12 DIAGNOSIS — F1020 Alcohol dependence, uncomplicated: Secondary | ICD-10-CM

## 2017-09-12 NOTE — Patient Instructions (Addendum)
Your tremor is mild and could be secondary to your antidepressant medication. I did not detect any signs of parkinsonism.  You have known nerve damage from extensive back surgeries. Your balance issues are likely due to a combination of factors. I am most concerned about your daily alcohol intake which is at risk to causebalance issues, tremor, withdrawal issues and also interactions with your several psychotropic medications. I would recommend that you reduce and eliminate your alcohol intake. Please have your primary care provider check your B12 level next time as well as your thiamine level and liver enzymes. Please stay better hydrated with water. You may need to use your brace for your foot drop on the right side. I do not suggest any additional testing from my end of things. I will see you back on an as-needed basis. Please also talk to your primary care provider or cardiologist about your increased heart rate.

## 2017-09-12 NOTE — Progress Notes (Signed)
Subjective:    Patient ID: Robert Castro is a 67 y.o. male.  HPI     Robert Age, MD, PhD [facility] [address], [address]O. [place], [place] [address]  Dear Robert Castro,   I saw your patient, Robert Castro, upon your kind request in my neurologic clinic today for initial consultation of his tremors. Of note, he no showed for an appointment on 08/13/17. The patient is unaccompanied today. Of note, patient no showed for an appointment on 08/14/2017. As you know, Robert Castro is a 67 year old right-handed gentleman with an underlying medical history of low testosterone, sleep apnea, leg edema, hypertension, hyperlipidemia, reflux disease, depression, arthritis, diabetes, Barrett's esophagus, and obesity, anxiety, and ADD, s/p several lower back surgeries (x 3, most recent 3 years ago), who reports bilateral hand tremors for the past several months.  He also has noted issues with his balance, has had some falls, he has foot drop bilaterally, R more than L. He has AFO for the right side but does not typically use it. He has had nerve damage affecting both legs, left more than right. He has had lower extremity swelling, left more than right. He has no family history of tremors or Parkinson's disease. I reviewed your office note from 06/19/2017, which you kindly included. Of note, he is on multiple medications including psychotropic medications including Wellbutrin 300 mg daily, Ambien, Xanax, Adderall, Valium, and Sertraline 150 mg daily. He has been on this for the past year or so. He was on Lexapro before. He is single and lives alone. He is a nonsmoker he drinks caffeine occasionally, but does consume daily alcohol, he admits that he may have a problem with alcohol. He had a DUI. He had an emergency room visit in April 2017 at which time his ethanol level was abnormally elevated. He does not always drink enough water he admits. He does report that his hand tremors started  after he was started on sertraline about a year ago.   His Past Medical History Is Significant For: Past Medical History:  Diagnosis Date  . Arthritis   . Barrett's esophagus   . Cataract    both eyes  . 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 Depression   . Diabetes ([place])   . GERD (gastroesophageal reflux disease)   . Hyperlipidemia   . Hypertension   . Hypothyroidism   . Leg edema, left   . Sleep apnea    cpap  . Testosterone deficiency   . Tremors of nervous system     His Past Surgical History Is Significant For: Past Surgical History:  Procedure Laterality Date  . BACK SURGERY  2009  . CATARACT EXTRACTION, BILATERAL  12/2016  . LUMBAR LAMINECTOMY/ DECOMPRESSION WITH MET-RX  2014  . TONSILLECTOMY  1956  . UMBILICAL HERNIA REPAIR      His Family History Is Significant For: Family History  Problem Relation Castro of Onset  . Atrial fibrillation Brother   . Colon cancer Neg Hx   . Stomach cancer Neg Hx   . Colon polyps Neg Hx   . Esophageal cancer Neg Hx   . Rectal cancer Neg Hx     His Social History Is Significant For: Social History   Socioeconomic History  . Marital status: Single    Spouse name: None  . Number of children: None  . Years of education: None  . Highest education level: None  Social  Needs  . Financial resource strain: None  . Food insecurity - worry: None  . Food insecurity - inability: None  . Transportation needs - medical: None  . Transportation needs - non-medical: None  Occupational History  . None  Tobacco Use  . Smoking status: Former Smoker    Last attempt to quit: 08/25/2002    Years since quitting: 15.0  . Smokeless tobacco: Never Used  . Tobacco comment: in college only 40 years ago  Substance and Sexual Activity  . Alcohol use: Yes    Alcohol/week: 8.4 - 16.8 oz    Types: 14 - 28 Standard drinks or equivalent per week    Comment: varies  daily drinker  . Drug use: No  . Sexual  activity: Yes  Other Topics Concern  . None  Social History Narrative  . None    His Allergies Are:  No Known Allergies:   His Current Medications Are:  Outpatient Encounter Medications as of 09/12/2017  Medication Sig  . ALPRAZolam (XANAX) 1 MG tablet Take 1 mg by mouth 2 (two) times daily as needed for anxiety.  [name] amLODipine (NORVASC) 10 MG tablet Take 10 mg Daily by mouth.   [name] amphetamine-dextroamphetamine (ADDERALL XR) 30 MG 24 hr capsule Take 30 mg daily by mouth.   [name] anastrozole (ARIMIDEX) 1 MG tablet Take 1 mg daily by mouth.  [name] aspirin EC 81 MG tablet Take 81 mg by mouth daily.  [name] atorvastatin (LIPITOR) 10 MG tablet TAKE 1 TABLET(10 MG) BY MOUTH DAILY  . buPROPion (WELLBUTRIN XL) 300 MG 24 hr tablet Take 300 mg by mouth Daily.   . chlorhexidine (PERIDEX) 0.12 % solution Use as directed 10 mLs in the mouth or throat 2 (two) times daily as needed.   . Cholecalciferol (VITAMIN D-1000 MAX ST) 1000 units tablet Take 1,000 Units by mouth daily.   . diazepam (VALIUM) 5 MG tablet Take 5 mg by mouth every 6 (six) hours as needed (for spasms).   . diclofenac (VOLTAREN) 75 MG EC tablet Take 75 mg by mouth 2 (two) times daily as needed (for pain.).   [name] fluticasone (FLONASE) 50 MCG/ACT nasal spray Place 1 spray into both nostrils daily as needed for allergies or rhinitis.  . Lancets (ONETOUCH ULTRASOFT) lancets 1 each by Misc.(Non-Drug; Combo Route) route daily. Dx E11.9  . metFORMIN (GLUCOPHAGE-XR) 750 MG 24 hr tablet Take 750 mg by mouth daily with breakfast.  . MILK THISTLE PO Take 1 capsule by mouth daily.  . Multiple Vitamin (MULTIVITAMIN) tablet Take 1 tablet by mouth daily.  . Multiple Vitamins-Minerals (PRESERVISION AREDS 2 PO) Take 2 capsules by mouth daily.  . Omega-3 Fatty Acids (FISH OIL) 1200 MG CAPS Take 1 capsule by mouth daily.   [name] omeprazole (PRILOSEC) 20 MG capsule Take 20 mg by mouth daily.  [name] PREVIDENT 5000 BOOSTER PLUS 1.1 % PSTE USE D AS TOOTHPASTE  . sertraline  (ZOLOFT) 100 MG tablet Take 150 mg daily by mouth.  . testosterone cypionate (DEPOTESTOTERONE CYPIONATE) 200 MG/ML injection Inject into the muscle every 14 (fourteen) days.   . Thiamine HCl (B-1 PO) Take 300 mg by mouth daily.  [name] thyroid (ARMOUR) 60 MG tablet Take 60 mg by mouth daily.  . traMADol (ULTRAM) 50 MG tablet Take 50 mg by mouth every 6 (six) hours as needed (for pain.).   [name] TURMERIC PO Take 1,000 mg by mouth daily.   [name] zolpidem (AMBIEN) 10 MG tablet Take 10 mg by mouth daily as  needed. For sleep  . [DISCONTINUED] sildenafil (VIAGRA) 100 MG tablet Take 100 mg by mouth daily as needed for erectile dysfunction.   No facility-administered encounter medications on file as of 09/12/2017.    Review of Systems:  Out of a complete 14 point review of systems, all are reviewed and negative with the exception of these symptoms as listed below: Review of Systems  Neurological:       Pt presents today to discuss his hand tremors, nerve pain, balance, and instability. Pt has osa and is treated with cpap.    Objective:  Neurological Exam  Physical Exam Physical Examination:   Vitals:   09/12/17 1001  BP: (!) 142/98  Pulse: (!) 108   General Examination: The patient is a very pleasant 67 y.o. male in no acute distress. He appears well-developed and well-nourished and adequately groomed.   HEENT: Normocephalic, atraumatic, pupils are equal, round and reactive to light and accommodation. Extraocular tracking is good without limitation to gaze excursion or nystagmus noted. Normal smooth pursuit is noted. Hearing seems mildly impaired. Face is symmetric with normal facial animation and normal facial sensation. Speech is clear with no dysarthria noted. There is no hypophonia. There is no lip, neck/head, jaw or voice tremor. Neck is supple with full range of motion. There are no carotid bruits on auscultation. Oropharynx exam reveals: moderate mouth dryness. Tongue protrudes centrally and palate  elevates symmetrically.   Chest: Clear to auscultation without wheezing, rhonchi or crackles noted.  Heart: S1+S2+0, regular and normal without murmurs, rubs or gallops noted.   Abdomen: Soft, non-tender and non-distended with normal bowel sounds appreciated on auscultation.  Extremities: There is 2+ pitting edema in the distal lower extremities bilaterally, L more than R.  Skin: Warm and dry without trophic changes noted.  Musculoskeletal: exam reveals no obvious joint deformities, tenderness or joint swelling or erythema.   Neurologically:  Mental status: The patient is awake, alert and oriented in all 4 spheres. His immediate and remote memory, attention, language skills and fund of knowledge are appropriate. There is no evidence of aphasia, agnosia, apraxia or anomia. Speech is clear with normal prosody and enunciation. Thought process is linear. Mood is normal and affect is normal.  Cranial nerves II - XII are as described above under HEENT exam. In addition: shoulder shrug is normal with equal shoulder height noted. Motor exam: Normal bulk, strength and tone is noted, with the exception of right-sided foot drop, mild left foot dorsiflexion weakness noted. There is no resting tremor, drift or rebound.   On 09/12/2017: Archimedes spiral drawing he has mild trembling bilaterally. Handwriting is legible, not tremulous, not micrographic. He has a mild bilateral upper extremity postural and action tremor, no intention tremor, no resting tremor.  Romberg is negative, except for mild sway. Reflexes are 1+ in the upper extremities, absent in both knees and 1+ in the ankles. Fine motor skills are globally mildly impaired.   Cerebellar testing: No dysmetria or intention tremor on finger to nose testing.  Sensory exam: intact to light touch in the upper and lower extremities.  Gait, station and balance: He stands with difficulty. No veering to one side is noted. No leaning to one side is noted.  Posture is Castro-appropriate and stance is narrow based. Gait shows slight limp on the right, mild difficulty lifting his right foot, turns cautiously, is unable to do tandem walk.               Assessment and Plan:  In summary, BRIAN ZEITLIN is a very pleasant 67 y.o.-year old male with an underlying medical history of low testosterone, sleep apnea, leg edema, hypertension, hyperlipidemia, reflux disease, depression, arthritis, diabetes, Barrett's esophagus, and obesity, anxiety, and ADD, s/p several lower back surgeries (x 3, most recent 3 years ago), who presents for consultation of his tremors. He has a mild  bilateral upper extremity tremor, could be in keeping with medication-induced tremor. He has a nonspecific gait disorder and balance issues. He is advised that his issues are probably multifactorial in nature. I do not think any specific neurologic testing is indicated at this time. He has multiple psychotropic medications, tremor could be most likely secondary to being on sertraline higher dose. Furthermore, I do worry about his daily alcohol consumption and issues with balance, suboptimal hydration with water, potential interaction of his medications with his alcohol. I talked to him at length today. He is advised to reduce and eliminate his alcohol intake, talk to you about additional blood work with regards to liver function, liver enzymes, and B12 level and thiamine level next time. He has an appointment pending with you. He is advised to consider using his AFO on the right side for his right foot drop. 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  I suggested as needed follow-up with me. He is reassured that he does not have any evidence of parkinsonism. I answered all his questions today and he was in agreement.  Thank you very much for allowing me to  participate in the care of this nice patient. If I can be of any further assistance to you please do not hesitate to call me at [phone].  Sincerely,   Robert Age, MD, PhD

## 2017-09-16 ENCOUNTER — Ambulatory Visit (HOSPITAL_COMMUNITY): Payer: Medicare Other | Admitting: Anesthesiology

## 2017-09-16 ENCOUNTER — Other Ambulatory Visit: Payer: Self-pay

## 2017-09-16 ENCOUNTER — Ambulatory Visit (HOSPITAL_COMMUNITY)
Admission: RE | Admit: 2017-09-16 | Discharge: 2017-09-16 | Disposition: A | Discharge status: Home / Self Care | Payer: Medicare Other | Source: Ambulatory Visit | Attending: Gastroenterology | Admitting: Gastroenterology

## 2017-09-16 ENCOUNTER — Telehealth: Payer: Self-pay

## 2017-09-16 ENCOUNTER — Encounter (HOSPITAL_COMMUNITY)
Admission: RE | Disposition: A | Discharge status: Home / Self Care | Payer: Self-pay | Source: Ambulatory Visit | Attending: Gastroenterology

## 2017-09-16 ENCOUNTER — Encounter (HOSPITAL_COMMUNITY): Payer: Self-pay

## 2017-09-16 DIAGNOSIS — E119 Type 2 diabetes mellitus without complications: Secondary | ICD-10-CM | POA: Diagnosis not present

## 2017-09-16 DIAGNOSIS — Z9842 Cataract extraction status, left eye: Secondary | ICD-10-CM | POA: Diagnosis not present

## 2017-09-16 DIAGNOSIS — I10 Essential (primary) hypertension: Secondary | ICD-10-CM | POA: Diagnosis not present

## 2017-09-16 DIAGNOSIS — Z9841 Cataract extraction status, right eye: Secondary | ICD-10-CM | POA: Diagnosis not present

## 2017-09-16 DIAGNOSIS — F329 Major depressive disorder, single episode, unspecified: Secondary | ICD-10-CM | POA: Insufficient documentation

## 2017-09-16 DIAGNOSIS — R600 Localized edema: Secondary | ICD-10-CM | POA: Diagnosis not present

## 2017-09-16 DIAGNOSIS — G4730 Sleep apnea, unspecified: Secondary | ICD-10-CM | POA: Insufficient documentation

## 2017-09-16 DIAGNOSIS — E669 Obesity, unspecified: Secondary | ICD-10-CM | POA: Diagnosis not present

## 2017-09-16 DIAGNOSIS — E291 Testicular hypofunction: Secondary | ICD-10-CM | POA: Diagnosis not present

## 2017-09-16 DIAGNOSIS — Z87891 Personal history of nicotine dependence: Secondary | ICD-10-CM | POA: Insufficient documentation

## 2017-09-16 DIAGNOSIS — Z6835 Body mass index (BMI) 35.0-35.9, adult: Secondary | ICD-10-CM | POA: Insufficient documentation

## 2017-09-16 DIAGNOSIS — Z7982 Long term (current) use of aspirin: Secondary | ICD-10-CM | POA: Insufficient documentation

## 2017-09-16 DIAGNOSIS — K317 Polyp of stomach and duodenum: Secondary | ICD-10-CM | POA: Insufficient documentation

## 2017-09-16 DIAGNOSIS — K2270 Barrett's esophagus without dysplasia: Secondary | ICD-10-CM | POA: Insufficient documentation

## 2017-09-16 DIAGNOSIS — E039 Hypothyroidism, unspecified: Secondary | ICD-10-CM | POA: Diagnosis not present

## 2017-09-16 DIAGNOSIS — R251 Tremor, unspecified: Secondary | ICD-10-CM | POA: Diagnosis not present

## 2017-09-16 DIAGNOSIS — E785 Hyperlipidemia, unspecified: Secondary | ICD-10-CM | POA: Diagnosis not present

## 2017-09-16 DIAGNOSIS — D132 Benign neoplasm of duodenum: Secondary | ICD-10-CM | POA: Insufficient documentation

## 2017-09-16 DIAGNOSIS — K228 Other specified diseases of esophagus: Secondary | ICD-10-CM | POA: Diagnosis not present

## 2017-09-16 DIAGNOSIS — M1990 Unspecified osteoarthritis, unspecified site: Secondary | ICD-10-CM | POA: Diagnosis not present

## 2017-09-16 DIAGNOSIS — K700 Alcoholic fatty liver: Secondary | ICD-10-CM | POA: Insufficient documentation

## 2017-09-16 DIAGNOSIS — Z7984 Long term (current) use of oral hypoglycemic drugs: Secondary | ICD-10-CM | POA: Insufficient documentation

## 2017-09-16 DIAGNOSIS — K219 Gastro-esophageal reflux disease without esophagitis: Secondary | ICD-10-CM | POA: Insufficient documentation

## 2017-09-16 DIAGNOSIS — Z79899 Other long term (current) drug therapy: Secondary | ICD-10-CM | POA: Diagnosis not present

## 2017-09-16 HISTORY — DX: Hypothyroidism, unspecified: E03.9

## 2017-09-16 HISTORY — PX: ESOPHAGOGASTRODUODENOSCOPY (EGD) WITH PROPOFOL: SHX5813

## 2017-09-16 LAB — GLUCOSE, CAPILLARY: Glucose-Capillary: 147 mg/dL — ABNORMAL HIGH (ref 65–99)

## 2017-09-16 SURGERY — ESOPHAGOGASTRODUODENOSCOPY (EGD) WITH PROPOFOL
Anesthesia: Monitor Anesthesia Care

## 2017-09-16 MED ORDER — PROPOFOL 10 MG/ML IV BOLUS
INTRAVENOUS | Status: DC | PRN
  Administered 2017-09-16 (×3): 20 mg via INTRAVENOUS

## 2017-09-16 MED ORDER — PROPOFOL 500 MG/50ML IV EMUL
INTRAVENOUS | Status: DC | PRN
  Administered 2017-09-16: 150 ug/kg/min via INTRAVENOUS

## 2017-09-16 MED ORDER — LACTATED RINGERS IV SOLN
INTRAVENOUS | Status: DC
  Administered 2017-09-16: 1000 mL via INTRAVENOUS

## 2017-09-16 MED ORDER — SODIUM CHLORIDE 0.9 % IV SOLN: INTRAVENOUS | Status: DC

## 2017-09-16 MED ORDER — PROPOFOL 10 MG/ML IV BOLUS
INTRAVENOUS | Status: AC
  Filled 2017-09-16: qty 20

## 2017-09-16 MED ORDER — LACTATED RINGERS IV SOLN: INTRAVENOUS | Status: DC

## 2017-09-16 MED ORDER — LIDOCAINE HCL (CARDIAC) 20 MG/ML IV SOLN
INTRAVENOUS | Status: DC | PRN
  Administered 2017-09-16: 100 mg via INTRATRACHEAL

## 2017-09-16 SURGICAL SUPPLY — 15 items

## 2017-09-16 NOTE — Transfer of Care (Signed)
Immediate Anesthesia Transfer of Care Note  Patient: Robert Castro  Procedure(s) Performed: ESOPHAGOGASTRODUODENOSCOPY (EGD) WITH PROPOFOL (N/A ) POLYPECTOMY (N/A )  Patient Location: PACU  Anesthesia Type:MAC  Level of Consciousness: awake, alert  and oriented  Airway & Oxygen Therapy: Patient connected to nasal cannula oxygen  Post-op Assessment: Report given to RN and Post -op Vital signs reviewed and stable  Post vital signs: Reviewed and stable  Last Vitals:  Vitals:   09/16/17 1059 09/16/17 1159  BP: (!) 146/113 (!) 137/96  Pulse: 97 82  Resp: 14 16  Temp: 36.5 C 36.6 C  SpO2: 95% 97%    Last Pain:  Vitals:   09/16/17 1159  TempSrc: Oral         Complications: No apparent anesthesia complications

## 2017-09-16 NOTE — Anesthesia Postprocedure Evaluation (Signed)
Anesthesia Post Note  Patient: Robert Castro  Procedure(s) Performed: ESOPHAGOGASTRODUODENOSCOPY (EGD) WITH PROPOFOL (N/A )     Patient location during evaluation: PACU Anesthesia Type: MAC Level of consciousness: awake and alert Pain management: pain level controlled Vital Signs Assessment: post-procedure vital signs reviewed and stable Respiratory status: spontaneous breathing, nonlabored ventilation, respiratory function stable and patient connected to nasal cannula oxygen Cardiovascular status: stable and blood pressure returned to baseline Postop Assessment: no apparent nausea or vomiting Anesthetic complications: no    Last Vitals:  Vitals:   09/16/17 1059 09/16/17 1159  BP: (!) 146/113 (!) 137/96  Pulse: 97 82  Resp: 14 16  Temp: 36.5 C 36.6 C  SpO2: 95% 97%    Last Pain:  Vitals:   09/16/17 1159  TempSrc: Oral                 [name]

## 2017-09-16 NOTE — H&P (Signed)
HPI:   Robert Castro is a 67 y.o. male here for EGD for EMR of duodenal adenoma. 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 Patient here for EGD with removal today. States he feels well without complaints.   Past Medical History:  Diagnosis Date  . Arthritis   . Barrett's esophagus   . Cataract    both eyes  . 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 Depression   . Diabetes ([place])   . GERD (gastroesophageal reflux disease)   . Hyperlipidemia   . Hypertension   . Hypothyroidism   . Leg edema, left   . Sleep apnea    cpap  . Testosterone deficiency   . Tremors of nervous system     Past Surgical History:  Procedure Laterality Date  . BACK SURGERY  2009  . CATARACT EXTRACTION, BILATERAL  12/2016  . LUMBAR LAMINECTOMY/ DECOMPRESSION WITH MET-RX  2014  . TONSILLECTOMY  1956  . UMBILICAL HERNIA REPAIR      Family History  Problem Relation Age of Onset  . Atrial fibrillation Brother   . Colon cancer Neg Hx   . Stomach cancer Neg Hx   . Colon polyps Neg Hx   . Esophageal cancer Neg Hx   . Rectal cancer Neg Hx      Social History   Tobacco Use  . Smoking status: Former Smoker    Last attempt to quit: 08/25/2002    Years since quitting: 15.0  . Smokeless tobacco: Never Used  . Tobacco comment: in college only 40 years ago  Substance Use Topics  . Alcohol use: Yes    Alcohol/week: 8.4 - 16.8 oz    Types: 14 - 28 Standard drinks or equivalent per week    Comment: varies  daily drinker  . Drug use: No    Prior to Admission medications   Medication Sig Start Date End Date Taking? Authorizing Provider  ALPRAZolam Duanne Moron) 1 MG tablet Take 1 mg by mouth 2 (two) times daily as needed for anxiety.   Yes [provider]  amLODipine (NORVASC) 10 MG tablet  Take 10 mg Daily by mouth.  02/14/11  Yes [provider]  amphetamine-dextroamphetamine (ADDERALL XR) 30 MG 24 hr capsule Take 30 mg daily by mouth.  08/25/12  Yes [provider]  anastrozole (ARIMIDEX) 1 MG tablet Take 1 mg daily by mouth.   Yes [provider]  aspirin EC 81 MG tablet Take 81 mg by mouth daily.   Yes [provider]  atorvastatin (LIPITOR) 10 MG tablet TAKE 1 TABLET(10 MG) BY MOUTH DAILY 09/06/16  Yes [provider]  buPROPion (WELLBUTRIN XL) 300 MG 24 hr tablet Take 300 mg by mouth Daily.  03/05/11  Yes [provider]  chlorhexidine (PERIDEX) 0.12 % solution Use as directed 10 mLs in the mouth or throat 2 (two) times daily as needed.  06/24/14  Yes [provider]  Cholecalciferol (VITAMIN D-1000 MAX ST) 1000 units tablet Take 1,000 Units by mouth daily.    Yes [provider]  diazepam (VALIUM) 5 MG tablet Take 5 mg by mouth every 6 (six) hours as needed (for spasms).  11/30/10  Yes [provider]  diclofenac (VOLTAREN) 75 MG  EC tablet Take 75 mg by mouth 2 (two) times daily as needed (for pain.).  08/12/12  Yes [provider]  fluticasone (FLONASE) 50 MCG/ACT nasal spray Place 1 spray into both nostrils daily as needed for allergies or rhinitis.   Yes [provider]  metFORMIN (GLUCOPHAGE-XR) 750 MG 24 hr tablet Take 750 mg by mouth daily with breakfast.   Yes [provider]  MILK THISTLE PO Take 1 capsule by mouth daily.   Yes [provider]  Multiple Vitamin (MULTIVITAMIN) tablet Take 1 tablet by mouth daily.   Yes [provider]  Multiple Vitamins-Minerals (PRESERVISION AREDS 2 PO) Take 2 capsules by mouth daily.   Yes [provider]  Omega-3 Fatty Acids (FISH OIL) 1200 MG CAPS Take 1 capsule by mouth daily.    Yes [provider]  omeprazole (PRILOSEC) 20 MG capsule Take 20 mg by mouth daily.   Yes [provider]    PREVIDENT 5000 BOOSTER PLUS 1.1 % PSTE USE D AS TOOTHPASTE 06/20/17  Yes [provider]  sertraline (ZOLOFT) 100 MG tablet Take 150 mg daily by mouth.   Yes [provider]  testosterone cypionate (DEPOTESTOTERONE CYPIONATE) 200 MG/ML injection Inject into the muscle every 14 (fourteen) days.  06/02/14  Yes [provider]  Thiamine HCl (B-1 PO) Take 300 mg by mouth daily.   Yes [provider]  thyroid (ARMOUR) 60 MG tablet Take 60 mg by mouth daily.   Yes [provider]  traMADol (ULTRAM) 50 MG tablet Take 50 mg by mouth every 6 (six) hours as needed (for pain.).  03/16/14  Yes [provider]  TURMERIC PO Take 1,000 mg by mouth daily.    Yes [provider]  zolpidem (AMBIEN) 10 MG tablet Take 10 mg by mouth daily as needed. For sleep 03/25/13  Yes [provider]  Lancets (ONETOUCH ULTRASOFT) lancets 1 each by Misc.(Non-Drug; Combo Route) route daily. Dx E11.9 08/07/16   [provider]    Current Facility-Administered Medications  Medication Dose Route Frequency Provider Last Rate Last Dose  . 0.9 %  sodium chloride infusion   Intravenous Continuous Armbruster, Carlota Raspberry, MD      . lactated ringers infusion   Intravenous Continuous Havery Moros Carlota Raspberry, MD 10 mL/hr at 09/16/17 1106 1,000 mL at 09/16/17 1106  . lactated ringers infusion   Intravenous Continuous Armbruster, Carlota Raspberry, MD        Allergies as of 07/25/2017  . (No Known Allergies)     Review of Systems:    As per HPI, otherwise negative    Physical Exam:  Vital signs in last 24 hours: Temp:  [97.7 F (36.5 C)] 97.7 F (36.5 C) (01/21 1059) Pulse Rate:  [97] 97 (01/21 1059) Resp:  [14] 14 (01/21 1059) BP: (146)/(113) 146/113 (01/21 1059) SpO2:  [95 %] 95 % (01/21 1059) Weight:  [265 lb (120.2 kg)] 265 lb (120.2 kg) (01/21 1059)   General:   Pleasant male in NAD Lungs:  Respirations even and unlabored. Lungs clear to auscultation  bilaterally.   No wheezes, crackles, or rhonchi.  Heart:  Regular rate and rhythm; no MRG Abdomen:  Soft, protuberant, nontender. No appreciable masses or hepatomegaly.  Neurologic:  Alert and  oriented x4;  grossly normal neurologically.   LAB RESULTS: No results for input(s): WBC, HGB, HCT, PLT in the last 72 hours. BMET No results for input(s): NA, K, CL, CO2, GLUCOSE, BUN, CREATININE, CALCIUM in the last  72 hours. LFT No results for input(s): PROT, ALBUMIN, AST, ALT, ALKPHOS, BILITOT, BILIDIR, IBILI in the last 72 hours. PT/INR No results for input(s): LABPROT, INR in the last 72 hours.  STUDIES: No results found.  Most recent labs in care everywhere reviewed.  PREVIOUS ENDOSCOPIES:               Impression / Plan:   66 y/o male with a history of duodenal adenoma noted incidentally during last EGD. Here for removal at hospital. I discussed duodenal adenoma with him and risk for small bowel malignancy, recommend removal. I discussed risks / benefits of EGD and anesthesiia with him, and risks for bleeding / perforation with removal of small bowel lesion. Will plan on lifting if amenable prior to removal. He wanted to proceed. Further recommendations pending the results.  Steven Armbruster, MD [facility] Pager [phone]    

## 2017-09-16 NOTE — Op Note (Signed)
[facility] Patient Name: Robert Castro Procedure Date: 09/16/2017 MRN: [id number] Attending MD: Carlota Raspberry. Jarah Pember MD, MD Date of Birth: 1950-11-09 CSN: [id number] Age: 67 Admit Type: Outpatient Procedure:                Upper GI endoscopy Indications:              removal of duodenal adenoma- incidentally noted on                            last EGD during Barrett's surveillance. Providers:                Carlota Raspberry. Sigmund Morera MD, MD, Cleda Daub, RN,                            William Dalton, Technician Referring MD:              Medicines:                Monitored Anesthesia Care Complications:            No immediate complications. Estimated blood loss:                            Minimal. Estimated Blood Loss:     Estimated blood loss was minimal. Procedure:                Pre-Anesthesia Assessment:                           - Prior to the procedure, a History and Physical                            was performed, and patient medications and                            allergies were reviewed. The patient's tolerance of                            previous anesthesia was also reviewed. The risks                            and benefits of the procedure and the sedation                            options and risks were discussed with the patient.                            All questions were answered, and informed consent                            was obtained. Prior Anticoagulants: The patient has                            taken no previous anticoagulant or antiplatelet  agents. ASA Grade Assessment: II - A patient with                            mild systemic disease. After reviewing the risks                            and benefits, the patient was deemed in                            satisfactory condition to undergo the procedure.                           After obtaining informed consent, the endoscope was      passed under direct vision. Throughout the                            procedure, the patient's blood pressure, pulse, and                            oxygen saturations were monitored continuously. The                            EG-[id number] ([id number]) scope was introduced through the                            mouth, and advanced to the second part of duodenum.                            The upper GI endoscopy was accomplished without                            difficulty. The patient tolerated the procedure                            well. Scope In: Scope Out: Findings:      Esophagogastric landmarks were identified: the Z-line was found at 42       cm, the gastroesophageal junction was found at 42 cm and the upper       extent of the gastric folds was found at 42 cm from the incisors.      The Z-line was irregular with islands of salmon colored mucosa. This       area was recently biopsied and consistent with nondysplastic Barrett's,       no further biopsies obtained.      The exam of the esophagus was otherwise normal.      Multiple small sessile polyps were found in the gastric antrum. These       have been previously biopsied and consistent with benign hyperplastic       polyps. No further polyps removed during this procedure.      No other significant abnormalities were identified in the stomach.      The duodenal bulb was normal.      A single sessile polyp was found in the second portion of the duodenum.       The base of the polyp was injected with 7 mL  7ccs of Eleview for a lift       polypectomy. Following injection, the size of the polyp was larger than       previously suspected (suspect 12-38m in size). Despite lift, I could       not clearly see the backside of the superior portion of the polyp.       During this time the small bowel had significant spasm, making       visualization difficult. Additionally, while I suspect the ampulla was a       few cm proximal to the  polyp, it was not clearly seen, and bile was       noted surrounding the polyp base and I could not clearly visualize the       ampulla without a side viewing endoscope. Given these factors, I elected       not to remove the polyp on this exam. Estimated blood loss was minimal.      The exam of the duodenum was otherwise normal. Impression:               - Esophagogastric landmarks identified.                           - Z-line irregular consistent with short segment of                            Barrett's, further biopsies not taken today given                            previous recent exam.                           - Benign small gastric hyperplastic polyps.                           - Normal duodenal bulb.                           - A single duodenal adenoma as outlined above -                            injected with Eleview in preparation for                            polypectomy, however given factors as outlined                            above, polypectomy was not attempted.                           - No specimens collected. Moderate Sedation:      No moderate sedation, case performed with MAC Recommendation:           - Patient has a contact number available for                            emergencies. The signs and symptoms of potential  delayed complications were discussed with the                            patient. Return to normal activities tomorrow.                            Written discharge instructions were provided to the                            patient.                           - Resume previous diet.                           - Continue present medications.                           - Repeat upper endoscopy with side viewing                            endoscope for polypectomy, ensure this lesion is                            not in close approximation to the ampulla.                            Additionally, given difficulty with  spasm of the                            bowel on this exam, recommend this be removed at                            tertiary care center. Will discuss with patient Procedure Code(s):        --- Professional ---                           [phone], Esophagogastroduodenoscopy, flexible,                            transoral; with directed submucosal injection(s),                            any substance Diagnosis Code(s):        --- Professional ---                           K22.8, Other specified diseases of esophagus                           K31.7, Polyp of stomach and duodenum CPT copyright 2016 American Medical Association. All rights reserved. The codes documented in this report are preliminary and upon coder review may  be revised to meet current compliance requirements. Remo Lipps P. Teryn Gust MD, MD 09/16/2017 12:04:07 PM This report has been signed electronically. Number of Addenda: 0

## 2017-09-16 NOTE — Anesthesia Preprocedure Evaluation (Signed)
Anesthesia Evaluation  Patient identified by MRN, date of birth, ID band Patient awake    Reviewed: Allergy & Precautions, H&P , NPO status , Patient's Chart, lab work & pertinent test results  Airway Mallampati: II   Neck ROM: full    Dental   Pulmonary sleep apnea , former smoker,    breath sounds clear to auscultation       Cardiovascular hypertension,  Rhythm:regular Rate:Normal     Neuro/Psych PSYCHIATRIC DISORDERS Depression    GI/Hepatic GERD  ,  Endo/Other  diabetes, Type 2Hypothyroidism obese  Renal/GU      Musculoskeletal  (+) Arthritis ,   Abdominal   Peds  Hematology   Anesthesia Other Findings   Reproductive/Obstetrics                             Anesthesia Physical Anesthesia Plan  ASA: II  Anesthesia Plan: MAC   Post-op Pain Management:    Induction: Intravenous  PONV Risk Score and Plan: 1 and Propofol infusion and Treatment may vary due to age or medical condition  Airway Management Planned: Nasal Cannula  Additional Equipment:   Intra-op Plan:   Post-operative Plan:   Informed Consent: I have reviewed the patients History and Physical, chart, labs and discussed the procedure including the risks, benefits and alternatives for the proposed anesthesia with the patient or authorized representative who has indicated his/her understanding and acceptance.     Plan Discussed with: Anesthesiologist, CRNA and Surgeon  Anesthesia Plan Comments:         Anesthesia Quick Evaluation

## 2017-09-16 NOTE — Progress Notes (Signed)
Eleview 7 ml injection in duodenal.

## 2017-09-16 NOTE — Interval H&P Note (Signed)
History and Physical Interval Note:  09/16/2017 11:20 AM  Robert Castro  has presented today for surgery, with the diagnosis of duodenal adenoma polyp  The various methods of treatment have been discussed with the patient and family. After consideration of risks, benefits and other options for treatment, the patient has consented to  Procedure(s): ESOPHAGOGASTRODUODENOSCOPY (EGD) WITH PROPOFOL (N/A) POLYPECTOMY (N/A) as a surgical intervention .  The patient's history has been reviewed, patient examined, no change in status, stable for surgery.  I have reviewed the patient's chart and labs.  Questions were answered to the patient's satisfaction.     [place]

## 2017-09-16 NOTE — Discharge Instructions (Signed)

## 2017-09-16 NOTE — Telephone Encounter (Signed)
Faxed referral information to [facility], attn: Dr. Mont Dutton. Their office is not open today, will contact them tomorrow to see about appointment.

## 2017-09-16 NOTE — Telephone Encounter (Signed)
-----   Message from Yetta Flock, MD sent at 09/16/2017 12:35 PM EST ----- Regarding: Referral to [facility] - Dr. Filbert Schilder, [place] like Mr. Belton to be referred to [facility], specifically Dr. Mont Dutton, for EMR of duodenal adenoma. Can you call him later today or tomorrow to help coordinate? Thanks very much.

## 2017-09-17 ENCOUNTER — Encounter (HOSPITAL_COMMUNITY): Payer: Self-pay | Admitting: Gastroenterology

## 2017-09-19 ENCOUNTER — Telehealth: Payer: Self-pay | Admitting: Gastroenterology

## 2017-09-19 ENCOUNTER — Telehealth: Payer: Self-pay

## 2017-09-19 NOTE — Telephone Encounter (Signed)
Spoke to patient, let him know I just got off the phone with [facility] and they are processing referral. He is going out of town for a couple of weeks, but does have phone with him to contact them if they leave a message. I have also given him their phone number to check on appointment, asked that he wait until 1/30 to give them time.

## 2017-09-19 NOTE — Telephone Encounter (Signed)
Spoke to [facility], they did receive the referral information and they are inputting it into their system. They will contact patient once information is reviewed to schedule procedure.

## 2017-09-25 ENCOUNTER — Ambulatory Visit: Payer: Medicare Other | Admitting: Gastroenterology

## 2017-09-26 ENCOUNTER — Telehealth: Payer: Self-pay

## 2017-09-26 NOTE — Telephone Encounter (Signed)
Patient has clinic visit at [facility] on 2/6 and procedure on 2/11 with Dr. Mont Dutton.

## 2017-10-03 NOTE — Telephone Encounter (Signed)
rec'd [place] note from 10-02-17 with Glee Arvin, PA. Placed on Dr. Doyne Keel desk.

## 2017-10-07 ENCOUNTER — Encounter: Payer: Self-pay | Admitting: Gastroenterology

## 2017-10-08 ENCOUNTER — Telehealth: Payer: Self-pay | Admitting: Gastroenterology

## 2017-10-08 NOTE — Telephone Encounter (Signed)
I called the patient. Relayed results of [place] and his liver workup. He drinks multiple alcoholic beverages per night and I think that is driving his fatty liver. He is also overweight and needs to work on weight loss. He was also told when he checked into [facility] for endoscopy his blood sugar was in the diabetes range and recommend he follow up with his primary care for that.   I'd like to see him in 3-4 months for a follow up visit and repeat labs at that time. Can you help set up a follow up in 3 months if the schedule is out? Thanks

## 2017-10-08 NOTE — Telephone Encounter (Signed)
Patient is going to have to cancel his follow up appointment here on 2/20 due to Dr. Mont Dutton having to do another procedure on that date. Patient did not want to have to wait until the end of March to discuss test results. I do not see any openings sooner. Please advise.

## 2017-10-08 NOTE — Telephone Encounter (Signed)
Patient requesting a cb as soon as possible to discuss getting polyp removed with [facility] and follow up with Dr.Armbruster. Pt states [facility] needs to know by noon today

## 2017-10-09 ENCOUNTER — Other Ambulatory Visit: Payer: Self-pay

## 2017-10-09 DIAGNOSIS — R7989 Other specified abnormal findings of blood chemistry: Secondary | ICD-10-CM

## 2017-10-09 DIAGNOSIS — K219 Gastro-esophageal reflux disease without esophagitis: Secondary | ICD-10-CM

## 2017-10-09 DIAGNOSIS — K22719 Barrett's esophagus with dysplasia, unspecified: Secondary | ICD-10-CM

## 2017-10-09 DIAGNOSIS — K760 Fatty (change of) liver, not elsewhere classified: Secondary | ICD-10-CM

## 2017-10-09 DIAGNOSIS — R945 Abnormal results of liver function studies: Principal | ICD-10-CM

## 2017-10-09 NOTE — Telephone Encounter (Signed)
Called and left detailed message about follow up appointment rescheduled for 12/25/17 at 11:00. Patient will need to also call back to set up nurse visit for Hep A and Hep B injections.

## 2017-10-16 ENCOUNTER — Ambulatory Visit: Payer: Medicare Other | Admitting: Gastroenterology

## 2017-12-18 ENCOUNTER — Telehealth: Payer: Self-pay

## 2017-12-18 ENCOUNTER — Other Ambulatory Visit: Payer: Self-pay

## 2017-12-18 NOTE — Telephone Encounter (Signed)
Confirmed with patient that his lab orders are in place and he can come do that prior to his visit on 12/25/17.

## 2017-12-25 ENCOUNTER — Ambulatory Visit: Payer: Medicare Other | Admitting: Gastroenterology

## 2017-12-26 ENCOUNTER — Telehealth: Payer: Self-pay

## 2017-12-26 NOTE — Telephone Encounter (Signed)
Left detailed message for pt to see if he still wants to follow up with Dr. Havery Moros. He no showed his appt yesterday, 12-25-17. He also needs to make appts for Twinrix series as he has never started.

## 2017-12-30 ENCOUNTER — Encounter: Payer: Self-pay | Admitting: *Deleted

## 2017-12-30 NOTE — Telephone Encounter (Signed)
Left voicemail for patient to call back. 

## 2017-12-30 NOTE — Telephone Encounter (Signed)
Patient returning call. Patient rescheduled to tomorrow 5.7.19 @1 :45PM and states he will go have labs done first before appt and if he needs to do something different to call and let him know.

## 2017-12-30 NOTE — Telephone Encounter (Signed)
Noted  

## 2017-12-31 ENCOUNTER — Ambulatory Visit: Payer: Medicare Other | Admitting: Gastroenterology

## 2018-02-28 ENCOUNTER — Encounter: Payer: Self-pay | Admitting: Gastroenterology

## 2018-02-28 ENCOUNTER — Encounter

## 2018-02-28 ENCOUNTER — Ambulatory Visit (INDEPENDENT_AMBULATORY_CARE_PROVIDER_SITE_OTHER): Payer: Medicare Other | Admitting: Gastroenterology

## 2018-02-28 VITALS — BP 146/78 | HR 84 | Ht 72.0 in | Wt 253.0 lb

## 2018-02-28 DIAGNOSIS — K760 Fatty (change of) liver, not elsewhere classified: Secondary | ICD-10-CM | POA: Diagnosis not present

## 2018-02-28 DIAGNOSIS — Z86010 Personal history of colonic polyps: Secondary | ICD-10-CM

## 2018-02-28 DIAGNOSIS — Z23 Encounter for immunization: Secondary | ICD-10-CM

## 2018-02-28 DIAGNOSIS — D132 Benign neoplasm of duodenum: Secondary | ICD-10-CM

## 2018-02-28 DIAGNOSIS — R269 Unspecified abnormalities of gait and mobility: Secondary | ICD-10-CM

## 2018-02-28 DIAGNOSIS — K709 Alcoholic liver disease, unspecified: Secondary | ICD-10-CM | POA: Diagnosis not present

## 2018-02-28 MED ORDER — SUPREP BOWEL PREP KIT 17.5-3.13-1.6 GM/177ML PO SOLN: ORAL | 0 refills | Status: DC

## 2018-02-28 NOTE — Patient Instructions (Addendum)
If you are age 69 or older, your body mass index should be between 23-30. Your Body mass index is 34.31 kg/m. If this is out of the aforementioned range listed, please consider follow up with your Primary Care Provider.  If you are age 27 or younger, your body mass index should be between 19-25. Your Body mass index is 34.31 kg/m. If this is out of the aformentioned range listed, please consider follow up with your Primary Care Provider.   You have been scheduled for a colonoscopy. Please follow written instructions given to you at your visit today.  Please pick up your prep supplies at the pharmacy within the next 1-3 days. If you use inhalers (even only as needed), please bring them with you on the day of your procedure. Your physician has requested that you go to www.startemmi.com and enter the access code given to you at your visit today. This web site gives a general overview about your procedure. However, you should still follow specific instructions given to you by our office regarding your preparation for the procedure.  Please abstain from alcohol  We have given you your first of 3 injections to vaccinate you for Hepatitis A and Hepatitis B.  You will need to come back for your 2nd injection in 30 days and your 3rd injection in 6 months.  You will be due for labs in November 2019.  We will remind you when it is time to go to the lab.  Thank you for entrusting me with your care and for choosing [facility], Dr. Van Vleck Cellar

## 2018-02-28 NOTE — Progress Notes (Signed)
HPI :  67 year old male here for reassessment of multiple issues as outlined below:  We performed an upper endoscopy for him in November for surveillance of Barrett's esophagus. He had a short segment of nondysplastic Barrett's on endoscopy, he also had a large duodenal adenoma. This was initially attempted to be removed at the hospital in January, however I could not easily visualize the ampulla and given the size of the lesion, I referred him to do This removed. Eventually had this removed by Dr. Mont Dutton the EMR in February. He has a follow-up endoscopy scheduled with her for next month.   He has also had an evaluation for chronic elevation in liver enzymes. Most recently in May his ALT was 72 and AST was 62, alkaline phosphatase 142. His platelet count was 132. He's had a prior CT scan and ultrasound of his liver showing steatosis. He endorses drinking 2-6 alcoholic beverages per day, usually daily, for several years. He's also had some weight gain since I have seen him and he was diagnosed with diabetes, A1c in the nines, currently on metformin. He denies any family history of liver disease. He had a serologic workup otherwise for his liver disease which do not show any clear abnormalities. He was not immune to hepatitis A and B and needs vaccines for this.  He otherwise denies any issues with his bowels. He denies any family history of colon cancer. He is due for surveillance colonoscopy for history of adenomas.  Of note he has been suffering from neuropathy in his feet and abnormal gait, as well as tremors in his hands. He is due to see a neurologist in [place] in a few weeks.   [place] 09/11/2017 - small gallstones, sludge, fatty liver noted CT abdomen / pelvis - 07/24/2016 - fatty liver   EGD 10/16/17 - Dr. Mont Dutton - piecemal resection of duodenal adenoma via EMR - path c/w adenoma EGD 10/07/2017 - [facility], stomach filled with food, procedure aborted EGD 09/16/2017 - polypectomy was planned but  procedure aborted due to size of polyp and spasm, referred to [facility]  EGD 07/15/2017 - irregular z-line, multiple gastric polyps, duodenal polyp,  Colonoscopy 09/18/2012 - small polyps, diverticulosis, one TA - recall in 5 years    Past Medical History:  Diagnosis Date  . Adenomatous duodenal polyp   . Arthritis   . Barrett's esophagus   . Cataract    both eyes  . 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 Depression   . Diabetes ([place])   . Diverticulosis   . Fatty liver   . GERD (gastroesophageal reflux disease)   . Hyperlipidemia   . Hypertension   . Hypothyroidism   . Internal hemorrhoids   . Leg edema, left   . Osteoarthritis   . Sleep apnea    cpap  . Testosterone deficiency   . Tremors of nervous system   . Umbilical hernia      Past Surgical History:  Procedure Laterality Date  . BACK SURGERY  2009  . CATARACT EXTRACTION, BILATERAL  12/2016  . ESOPHAGOGASTRODUODENOSCOPY (EGD) WITH PROPOFOL N/A 09/16/2017   Procedure: ESOPHAGOGASTRODUODENOSCOPY (EGD) WITH PROPOFOL;  Surgeon: Yetta Flock, MD;  Location: [facility];  Service: Gastroenterology;  Laterality: N/A;  . LUMBAR LAMINECTOMY/ DECOMPRESSION WITH MET-RX  2014  . TONSILLECTOMY  1956  . UMBILICAL HERNIA REPAIR     Family History  Problem Relation Age of Onset  . Atrial fibrillation Brother   .  Colon cancer Neg Hx   . Stomach cancer Neg Hx   . Colon polyps Neg Hx   . Esophageal cancer Neg Hx   . Rectal cancer Neg Hx    Social History   Tobacco Use  . Smoking status: Former Smoker    Last attempt to quit: 08/25/2002    Years since quitting: 15.5  . Smokeless tobacco: Never Used  . Tobacco comment: in college only 40 years ago  Substance Use Topics  . Alcohol use: Yes    Alcohol/week: 8.4 - 16.8 oz    Types: 14 - 28 Standard drinks or equivalent per week    Comment: varies  daily drinker  . Drug use: No   Current Outpatient Medications  Medication Sig  Dispense Refill  . ALPRAZolam (XANAX) 1 MG tablet Take 1 mg by mouth 2 (two) times daily as needed for anxiety.    [name] amLODipine (NORVASC) 10 MG tablet Take 10 mg Daily by mouth.     [name] amphetamine-dextroamphetamine (ADDERALL XR) 30 MG 24 hr capsule Take 30 mg daily by mouth.     [name] anastrozole (ARIMIDEX) 1 MG tablet Take 1 mg daily by mouth.    [name] aspirin EC 81 MG tablet Take 81 mg by mouth daily.    [name] atorvastatin (LIPITOR) 10 MG tablet TAKE 1 TABLET(10 MG) BY MOUTH DAILY    . buPROPion (WELLBUTRIN XL) 300 MG 24 hr tablet Take 300 mg by mouth Daily.     . chlorhexidine (PERIDEX) 0.12 % solution Use as directed 10 mLs in the mouth or throat 2 (two) times daily as needed.   0  . Cholecalciferol (VITAMIN D-1000 MAX ST) 1000 units tablet Take 1,000 Units by mouth daily.     . diazepam (VALIUM) 5 MG tablet Take 5 mg by mouth every 6 (six) hours as needed (for spasms).     . diclofenac (VOLTAREN) 75 MG EC tablet Take 75 mg by mouth 2 (two) times daily as needed (for pain.).     [name] fluticasone (FLONASE) 50 MCG/ACT nasal spray Place 1 spray into both nostrils daily as needed for allergies or rhinitis.    . Lancets (ONETOUCH ULTRASOFT) lancets 1 each by Misc.(Non-Drug; Combo Route) route daily. Dx E11.9    . metFORMIN (GLUCOPHAGE-XR) 750 MG 24 hr tablet Take 750 mg by mouth daily with breakfast.    . MILK THISTLE PO Take 1 capsule by mouth daily.    . Multiple Vitamin (MULTIVITAMIN) tablet Take 1 tablet by mouth daily.    . Multiple Vitamins-Minerals (PRESERVISION AREDS 2 PO) Take 2 capsules by mouth daily.    . Omega-3 Fatty Acids (FISH OIL) 1200 MG CAPS Take 1 capsule by mouth daily.     [name] omeprazole (PRILOSEC) 20 MG capsule Take 20 mg by mouth daily.    [name] PREVIDENT 5000 BOOSTER PLUS 1.1 % PSTE USE D AS TOOTHPASTE  0  . sertraline (ZOLOFT) 100 MG tablet Take 150 mg daily by mouth.    . testosterone cypionate (DEPOTESTOTERONE CYPIONATE) 200 MG/ML injection Inject into the muscle every 14 (fourteen) days.    1  . Thiamine HCl (B-1 PO) Take 300 mg by mouth daily.    [name] thyroid (ARMOUR) 60 MG tablet Take 60 mg by mouth daily.    . traMADol (ULTRAM) 50 MG tablet Take 50 mg by mouth every 6 (six) hours as needed (for pain.).     [name] TURMERIC PO Take 1,000 mg by mouth daily.     [name]  zolpidem (AMBIEN) 10 MG tablet Take 10 mg by mouth daily as needed. For sleep     No current facility-administered medications for this visit.    No Known Allergies   Review of Systems: All systems reviewed and negative except where noted in HPI.   Lab Results  Component Value Date   WBC 6.7 01/30/2008   HGB 15.9 01/30/2008   HCT 46.2 01/30/2008   MCV 94.0 01/30/2008   PLT 219 01/30/2008    No results found for: CREATININE, BUN, NA, K, CL, CO2  Lab Results  Component Value Date   ALT 59 (H) 08/13/2017   AST 43 (H) 08/13/2017   ALKPHOS 95 08/13/2017   BILITOT 0.9 08/13/2017     Physical Exam: BP (!) 146/78   Pulse 84   Ht 6' (1.829 m)   Wt 253 lb (114.8 kg)   BMI 34.31 kg/m  Constitutional: Pleasant,well-developed, male in no acute distress. HEENT: Normocephalic and atraumatic. Conjunctivae are normal. No scleral icterus. Neck supple.  Cardiovascular: Normal rate, regular rhythm.  Pulmonary/chest: Effort normal and breath sounds normal. No wheezing, rales or rhonchi. Abdominal: Soft, protuberant, nontender. There are no masses palpable. Extremities: trace edema Lymphadenopathy: No cervical adenopathy noted. Neurological: Alert and oriented to person place and time. Skin: Skin is warm and dry. No rashes noted. Psychiatric: Normal mood and affect. Behavior is normal.   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 I'm concerned about his ongoing alcohol use and  risks for cirrhosis moving forward. I discussed long-term risks for development of cirrhosis. I'm recommending he completely abstain from alcohol at this point. He will also work on weight loss and management of his diabetes. We will plan on repeating his liver enzymes in 6 months and he will see me at the time. Pending on his labs and course moving forward, if concern for cirrhosis develops will need repeated imaging of his liver. Hep A/B vaccine given today.  Duodenal adenoma - patient will follow up with Dr. Mont Dutton for surveillance after EMR in recent months.  History of colon polyps - due for surveillance colonoscopy at this time, discussed risks / benefits and he wanted to proceed.  Neuropathy / Gait abnormality - Pending neurology evaluation at [facility]. Unclear if alcohol use is related to this, but recommend he completely abstain light of these issues as well.  Centralia Cellar, MD [facility]

## 2018-03-20 ENCOUNTER — Encounter: Payer: Self-pay | Admitting: Gastroenterology

## 2018-03-20 ENCOUNTER — Ambulatory Visit (AMBULATORY_SURGERY_CENTER): Payer: Medicare Other | Admitting: Gastroenterology

## 2018-03-20 VITALS — BP 112/76 | HR 63 | Temp 97.1°F | Resp 13 | Ht 72.0 in | Wt 253.0 lb

## 2018-03-20 DIAGNOSIS — Z860100 Personal history of colon polyps, unspecified: Secondary | ICD-10-CM

## 2018-03-20 DIAGNOSIS — D122 Benign neoplasm of ascending colon: Secondary | ICD-10-CM | POA: Diagnosis not present

## 2018-03-20 DIAGNOSIS — K621 Rectal polyp: Secondary | ICD-10-CM | POA: Diagnosis not present

## 2018-03-20 DIAGNOSIS — D124 Benign neoplasm of descending colon: Secondary | ICD-10-CM

## 2018-03-20 DIAGNOSIS — D127 Benign neoplasm of rectosigmoid junction: Secondary | ICD-10-CM | POA: Diagnosis not present

## 2018-03-20 DIAGNOSIS — D129 Benign neoplasm of anus and anal canal: Secondary | ICD-10-CM

## 2018-03-20 DIAGNOSIS — D128 Benign neoplasm of rectum: Secondary | ICD-10-CM

## 2018-03-20 DIAGNOSIS — K635 Polyp of colon: Secondary | ICD-10-CM

## 2018-03-20 DIAGNOSIS — D123 Benign neoplasm of transverse colon: Secondary | ICD-10-CM | POA: Diagnosis not present

## 2018-03-20 MED ORDER — SODIUM CHLORIDE 0.9 % IV SOLN: 500.0000 mL | INTRAVENOUS | Status: DC

## 2018-03-20 NOTE — Progress Notes (Signed)
Report given to PACU, vss 

## 2018-03-20 NOTE — Progress Notes (Signed)
Called to room to assist during endoscopic procedure.  Patient ID and intended procedure confirmed with present staff. Received instructions for my participation in the procedure from the performing physician.  

## 2018-03-20 NOTE — Patient Instructions (Signed)
Thank you for allowing [place] to care for you today!  Await pathology results by mail, approximately 2-3 weeks.   No Ibuprofen (Advil) , Naproxen (Alleve) or other non-steroidal anti-inflammatory meds for two (2) weeks.  Resume previous diet and medications.     YOU HAD AN ENDOSCOPIC PROCEDURE TODAY AT [facility]:   Refer to the procedure report that was given to you for any specific questions about what was found during the examination.  If the procedure report does not answer your questions, please call your gastroenterologist to clarify.  If you requested that your care partner not be given the details of your procedure findings, then the procedure report has been included in a sealed envelope for you to review at your convenience later.  YOU SHOULD EXPECT: Some feelings of bloating in the abdomen. Passage of more gas than usual.  Walking can help get rid of the air that was put into your GI tract during the procedure and reduce the bloating. If you had a lower endoscopy (such as a colonoscopy or flexible sigmoidoscopy) you may notice spotting of blood in your stool or on the toilet paper. If you underwent a bowel prep for your procedure, you may not have a normal bowel movement for a few days.  Please Note:  You might notice some irritation and congestion in your nose or some drainage.  This is from the oxygen used during your procedure.  There is no need for concern and it should clear up in a day or so.  SYMPTOMS TO REPORT IMMEDIATELY:   Following lower endoscopy (colonoscopy or flexible sigmoidoscopy):  Excessive amounts of blood in the stool  Significant tenderness or worsening of abdominal pains  Swelling of the abdomen that is new, acute  Fever of 100F or higher    For urgent or emergent issues, a gastroenterologist can be reached at any hour by calling [phone].   DIET:  We do recommend a small meal at first, but then you may proceed to your regular  diet.  Drink plenty of fluids but you should avoid alcoholic beverages for 24 hours.  ACTIVITY:  You should plan to take it easy for the rest of today and you should NOT DRIVE or use heavy machinery until tomorrow (because of the sedation medicines used during the test).    FOLLOW UP: Our staff will call the number listed on your records the next business day following your procedure to check on you and address any questions or concerns that you may have regarding the information given to you following your procedure. If we do not reach you, we will leave a message.  However, if you are feeling well and you are not experiencing any problems, there is no need to return our call.  We will assume that you have returned to your regular daily activities without incident.  If any biopsies were taken you will be contacted by phone or by letter within the next 1-3 weeks.  Please call us at [phone] if you have not heard about the biopsies in 3 weeks.    SIGNATURES/CONFIDENTIALITY: You and/or your care partner have signed paperwork which will be entered into your electronic medical record.  These signatures attest to the fact that that the information above on your After Visit Summary has been reviewed and is understood.  Full responsibility of the confidentiality of this discharge information lies with you and/or your care-partner.

## 2018-03-20 NOTE — Op Note (Signed)
[place] Patient Name: Robert Castro Procedure Date: 03/20/2018 1:22 PM MRN: [id number] Endoscopist: Remo Lipps P. Havery Moros , MD Age: 67 Referring MD:  Date of Birth: May 22, 1951 Gender: Male Account #: [id number] Procedure:                Colonoscopy Indications:              Surveillance: Personal history of adenomatous                            polyps on last colonoscopy 5 years ago Medicines:                Monitored Anesthesia Care Procedure:                Pre-Anesthesia Assessment:                           - Prior to the procedure, a History and Physical                            was performed, and patient medications and                            allergies were reviewed. The patient's tolerance of                            previous anesthesia was also reviewed. The risks                            and benefits of the procedure and the sedation                            options and risks were discussed with the patient.                            All questions were answered, and informed consent                            was obtained. Prior Anticoagulants: The patient has                            taken no previous anticoagulant or antiplatelet                            agents. ASA Grade Assessment: II - A patient with                            mild systemic disease. After reviewing the risks                            and benefits, the patient was deemed in                            satisfactory condition to undergo the procedure.  After obtaining informed consent, the colonoscope                            was passed under direct vision. Throughout the                            procedure, the patient's blood pressure, pulse, and                            oxygen saturations were monitored continuously. The                            Colonoscope was introduced through the anus and                            advanced to the the  cecum, identified by                            appendiceal orifice and ileocecal valve. The                            colonoscopy was performed without difficulty. The                            patient tolerated the procedure well. The quality                            of the bowel preparation was fair. The ileocecal                            valve, appendiceal orifice, and rectum were                            photographed. Scope In: 1:33:07 PM Scope Out: 2:17:22 PM Scope Withdrawal Time: 0 hours 34 minutes 14 seconds  Total Procedure Duration: 0 hours 44 minutes 15 seconds  Findings:                 The perianal and digital rectal examinations were                            normal.                           A single medium-sized angiodysplastic lesion was                            found in the cecum.                           Two sessile polyps were found in the ascending                            colon. The polyps were 4 to 5 mm in size. These  polyps were removed with a cold snare. Resection                            and retrieval were complete.                           Two sessile polyps were found in the hepatic                            flexure. The polyps were 3 to 6 mm in size. These                            polyps were removed with a cold snare. Resection                            and retrieval were complete.                           12 sessile polyps were found in the transverse                            colon. The polyps were 3 to 8 mm in size. These                            polyps were removed with a cold snare. Resection                            and retrieval were complete.                           Two sessile polyps were found in the descending                            colon. The polyps were 5 to 7 mm in size. These                            polyps were removed with a cold snare. Resection                            and  retrieval were complete.                           A 6 mm polyp was found in the recto-sigmoid colon.                            The polyp was semi-pedunculated. The polyp was                            removed with a hot snare. Resection and retrieval                            were complete.  Two sessile polyps were found in the rectum. The                            polyps were 3 to 4 mm in size. These polyps were                            removed with a cold snare. Resection and retrieval                            were complete.                           Multiple small-mouthed diverticula were found in                            the left colon.                           The prep was fair. Time taken to lavage the colon.                            The cecal cap could not be cleared due to residual                            stool. The exam was otherwise without abnormality                            on direct and retroflexion views. Complications:            No immediate complications. Estimated blood loss:                            Minimal. Estimated Blood Loss:     Estimated blood loss was minimal. Impression:               - Preparation of the colon was fair, cecal cap                            could not be cleared.                           - A single colonic angiodysplastic lesion.                           - Two 4 to 5 mm polyps in the ascending colon,                            removed with a cold snare. Resected and retrieved.                           - Two 3 to 6 mm polyps at the hepatic flexure,                            removed with a cold snare. Resected and  retrieved.                           - 12 x 3 to 8 mm polyps in the transverse colon,                            removed with a cold snare. Resected and retrieved.                           - Two 5 to 7 mm polyps in the descending colon,                            removed with a cold  snare. Resected and retrieved.                           - One 6 mm polyp at the recto-sigmoid colon,                            removed with a hot snare. Resected and retrieved.                           - Two 3 to 4 mm polyps in the rectum, removed with                            a cold snare. Resected and retrieved.                           - Diverticulosis in the left colon.                           - The examination was otherwise normal on direct                            and retroflexion views. Recommendation:           - Patient has a contact number available for                            emergencies. The signs and symptoms of potential                            delayed complications were discussed with the                            patient. Return to normal activities tomorrow.                            Written discharge instructions were provided to the                            patient.                           - Resume previous diet.                           -  Continue present medications.                           - Await pathology results.                           - Recommend referral to genetic counselor pending                            polyps are adenomatous                           - No ibuprofen, naproxen, or other non-steroidal                            anti-inflammatory drugs for 2 weeks after polyp                            removal. Remo Lipps P. Havery Moros, MD 03/20/2018 2:25:35 PM This report has been signed electronically.

## 2018-03-25 ENCOUNTER — Telehealth: Payer: Self-pay | Admitting: Gastroenterology

## 2018-03-25 ENCOUNTER — Telehealth: Payer: Self-pay

## 2018-03-25 NOTE — Telephone Encounter (Signed)
Called [phone] and left a messaged we tried to reach pt for a follow up call. maw

## 2018-03-25 NOTE — Telephone Encounter (Signed)
Patient requesting a call to discuss path of colon from 7.25.19 when results come back. Pt states okay to leave detailed vm.

## 2018-03-25 NOTE — Telephone Encounter (Signed)
  Follow up Call-  Call back number 03/20/2018 07/15/2017  Post procedure Call Back phone  # [phone]  Permission to leave phone message Yes Yes  Some recent data might be hidden     Patient questions:  Do you have a fever, pain , or abdominal swelling? No. Pain Score  0 *  Have you tolerated food without any problems? Yes.    Have you been able to return to your normal activities? Yes.    Do you have any questions about your discharge instructions: Diet   No. Medications  No. Follow up visit  No.  Do you have questions or concerns about your Care? No.  Actions: * If pain score is 4 or above: No action needed, pain <4.

## 2018-03-27 ENCOUNTER — Other Ambulatory Visit: Payer: Self-pay

## 2018-03-27 DIAGNOSIS — K635 Polyp of colon: Secondary | ICD-10-CM

## 2018-03-28 ENCOUNTER — Telehealth: Payer: Self-pay | Admitting: Genetics

## 2018-03-28 ENCOUNTER — Encounter: Payer: Self-pay | Admitting: Genetics

## 2018-03-28 NOTE — Telephone Encounter (Signed)
New genetic counseling referral received from [place] for polyposis of colon. Pt has been scheduled to see Ferol Luz on 8/27 at 1pm. Letter mailed. Address verified.

## 2018-04-01 ENCOUNTER — Ambulatory Visit: Payer: Medicare Other | Admitting: Physical Therapy

## 2018-04-03 ENCOUNTER — Ambulatory Visit: Payer: Medicare Other | Attending: Family Medicine | Admitting: Rehabilitative and Restorative Service Providers"

## 2018-04-03 DIAGNOSIS — M6281 Muscle weakness (generalized): Secondary | ICD-10-CM | POA: Insufficient documentation

## 2018-04-03 DIAGNOSIS — R296 Repeated falls: Secondary | ICD-10-CM | POA: Diagnosis present

## 2018-04-03 DIAGNOSIS — R2681 Unsteadiness on feet: Secondary | ICD-10-CM | POA: Insufficient documentation

## 2018-04-03 DIAGNOSIS — M21371 Foot drop, right foot: Secondary | ICD-10-CM | POA: Insufficient documentation

## 2018-04-03 DIAGNOSIS — R2689 Other abnormalities of gait and mobility: Secondary | ICD-10-CM | POA: Insufficient documentation

## 2018-04-04 ENCOUNTER — Encounter: Payer: Self-pay | Admitting: Rehabilitative and Restorative Service Providers"

## 2018-04-04 NOTE — Therapy (Signed)
[facility] [address] [place], [place], [address] Phone: [phone]   Fax:  [phone]  Physical Therapy Evaluation  Patient Details  Name: Robert Castro MRN: [id number] Date of Birth: 05-20-1951 Referring Provider: Bing Matter, PA   Encounter Date: 04/03/2018  PT End of Session - 04/04/18 1015    Visit Number  1    Number of Visits  17    Date for PT Re-Evaluation  06/03/18    Authorization Type  medicare and AARP    PT Start Time  1405    PT Stop Time  1455    PT Time Calculation (min)  50 min    Equipment Utilized During Treatment  Gait belt    Activity Tolerance  Patient tolerated treatment well    Behavior During Therapy  [facility] for tasks assessed/performed       Past Medical History:  Diagnosis Date  . Adenomatous duodenal polyp   . Arthritis   . Barrett's esophagus   . Cataract    both eyes  . 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 Depression   . Diabetes ([place])   . Diverticulosis   . Fatty liver   . GERD (gastroesophageal reflux disease)   . Hyperlipidemia   . Hypertension   . Hypothyroidism   . Internal hemorrhoids   . Leg edema, left   . Osteoarthritis   . Sleep apnea    cpap  . Testosterone deficiency   . Tremors of nervous system   . Umbilical hernia     Past Surgical History:  Procedure Laterality Date  . BACK SURGERY  2009  . CATARACT EXTRACTION, BILATERAL  12/2016  . ESOPHAGOGASTRODUODENOSCOPY (EGD) WITH PROPOFOL N/A 09/16/2017   Procedure: ESOPHAGOGASTRODUODENOSCOPY (EGD) WITH PROPOFOL;  Surgeon: Yetta Flock, MD;  Location: [facility];  Service: Gastroenterology;  Laterality: N/A;  . LUMBAR LAMINECTOMY/ DECOMPRESSION WITH MET-RX  2014  . TONSILLECTOMY  1956  . UMBILICAL HERNIA REPAIR      There were no vitals filed for this visit.   Subjective Assessment - 04/03/18 1408    Subjective  The patient reports he is having  increasing falls.  He notes that he began having falls in February or March 2019 associated with brain fog and strange sensations (holds his head).   He denies room spinning, but notes "somewhat of a dizzy feeling".  He notes he has reduced his alcohol intake since this began at the direction of his doctor.  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  He notices a decreased coordination with LEs and foot placement.  He also reports overall weakness.      Pertinent History  diabetes, h/o drinking, back surgery 2009, 2014    Patient Stated Goals  "the biggest thing is that my walking has been greatly impaired by this.  I can't keep up with everyone."  He reports having to turn sideways to go down stairs.  He also notes a funny feeling in his head.     Currently in Pain?  Yes   "I am pain free when I sit down or lie down"   Pain Location  Back    Pain Descriptors / Indicators  Aching    Pain Radiating Towards  pain in legs, pain in back    Pain Onset  More than a month ago    Pain Frequency  Intermittent    Aggravating Factors   being upright    Pain Relieving Factors  resting         [facility] PT Assessment - 04/03/18 1423      Assessment   Medical Diagnosis  peripheral neuropathy    Referring Provider  Bing Matter, PA    Onset Date/Surgical Date  --   09/2017   Hand Dominance  Right    Prior Therapy  none      Precautions   Precautions  Fall    Precaution Comments  Also had back surgery 2016 ([place])      Balance Screen   Has the patient fallen in the past 6 months  Yes    How many times?  10- 1 wrist injury (2 days ago)    Has the patient had a decrease in activity level because of a fear of falling?   Yes    Is the patient reluctant to leave their home because of a fear of falling?   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      ROM / Strength   AROM / PROM / Strength  AROM;Strength      AROM   Overall AROM   Within functional limits for tasks performed      Strength   Overall Strength  Deficits    Overall Strength Comments  Bilat shoulder flexion/abduction and elbow flexion 5/5.   Bilat hip flexion 5/5, knee flexion/extension 5/5.  Ankle DF R is 4-/5, L is 4/5, R PF is 4+/5, L is 3/5,   Has h/o R foot numbness after back sugery R hip aduction 3/5, L hip abduction 5/5       Ambulation/Gait   Ambulation/Gait  Yes    Ambulation/Gait Assistance  5: Supervision    Ambulation Distance (Feet)  200 Feet    Assistive device  None    Gait Pattern  Trendelenburg;Decreased stance time - right;Decreased step length - left;Decreased stride length;Lateral hip instability;Lateral trunk lean to left;Poor foot clearance - right;Decreased weight shift to right;Decreased dorsiflexion - right   foot slap audible on right   Ambulation Surface  Level;Indoor    Gait velocity  2.29 ft/sec    Stairs  Yes  Stairs Assistance  5: Supervision    Stair Management Technique  Two rails;Step  to pattern    Number of Stairs  4      Standardized Balance Assessment   Standardized Balance Assessment  Berg Balance Test      Berg Balance Test   Sit to Stand  Able to stand without using hands and stabilize independently    Standing Unsupported  Able to stand 2 minutes with supervision    Sitting with Back Unsupported but Feet Supported on Floor or Stool  Able to sit safely and securely 2 minutes    Stand to Sit  Sits safely with minimal use of hands    Transfers  Able to transfer safely, minor use of hands    Standing Unsupported with Eyes Closed  Able to stand 10 seconds with supervision    Standing Ubsupported with Feet Together  Able to place feet together independently and stand for 1 minute with supervision    From Standing, Reach Forward with Outstretched Arm  Can reach forward >5 cm safely (2")    From Standing Position, Pick up Object from [place] to pick up shoe, needs supervision    From Standing Position, Turn to Look Behind Over each Shoulder  Needs supervision when turning    Turn 360 Degrees  Needs assistance while turning    Standing Unsupported, Alternately Place Feet on Step/Stool  Needs assistance to keep from falling or unable to try    Standing Unsupported, One Foot in Front  Needs help to step but can hold 15 seconds    Standing on One Leg  Unable to try or needs assist to prevent fall    Total Score  32    Berg comment:  32/56           Vestibular Assessment - 04/03/18 1430      Symptom Behavior   Type of Dizziness  Imbalance    Frequency of Dizziness  when on his feet    Duration of Dizziness  constant on his feet    Aggravating Factors  Activity in general    Relieving Factors  Head stationary      Vestibulo-Occular Reflex   VOR 1 Head Only (x 1 viewing)  Slow gaze, patient notes some sensation of target moving    Comment  Unable to perform head impulse test due to cervical muscle guarding          Objective measurements completed on  examination: See above findings.                PT Short Term Goals - 04/04/18 1015      PT SHORT TERM GOAL #1   Title  The patient will return demo HEP for right hip abduction strengthening, ankle strengthening and balance near support surface.  (STGs due 05/03/18)    Time  4    Period  Weeks    Target Date  05/03/18      PT SHORT TERM GOAL #2   Title  The patient will improve Berg from 32/56 to > or equal to 38/56 to demo dec'ing risk for falls.    Time  4    Period  Weeks    Target Date  05/03/18      PT SHORT TERM GOAL #3   Title  The patient will ambulate with SPC x 400 ft mod indep without loss of balance.    Baseline  Uses no device and needs supervision  to CGA.    Time  4    Period  Weeks    Target Date  05/03/18      PT SHORT TERM GOAL #4   Title  The patient will negotiate 4 steps with one handrail and reciprocal pattern mod indep.    Baseline  The patient needs supervision on steps    Time  4    Period  Weeks    Target Date  05/03/18      PT SHORT TERM GOAL #5   Title  Trial AFOs R LE to determine if this improves functional ambulation.    Time  4    Period  Weeks    Target Date  05/03/18        PT Long Term Goals - 04/04/18 1209      PT LONG TERM GOAL #1   Title  The patient will be indep with progression of HEP.    Time  8    Period  Weeks    Target Date  06/02/18      PT LONG TERM GOAL #2   Title  The patient will improve Berg balance scale from 32/56 to > or equal to 42/56 to demo dec'ing risk for falls.    Time  8    Period  Weeks    Target Date  06/02/18      PT LONG TERM GOAL #3   Title  The patinet will improve gait speed from 2.29 ft/sec to > or equal to 2.62 ft/sec to demo transition to full community ambulator classification of gait.    Time  8    Period  Weeks    Target Date  06/02/18      PT LONG TERM GOAL #4   Title  The patient will negotiate community surfaces including curbs, inclines, and paved surfaces x 1000 ft with  SPC mod indep.    Time  8    Period  Weeks    Target Date  06/02/18             Plan - 04/04/18 1215    Clinical Impression Statement  The patient is a 67 year old male presenting to outpateint physical therapy with multi-factorial fall risk.  He has h/o lumbar surgeries and reports a h/o R foot slap and also has R glut med weakness (most likely from prior back issues).  He has newer onset of L ankle weakness (plantarflexors > dorsiflexors) that he associates with elevated A1C levels and notes h/o alcohol use (he is down to 1-2 drinks/day).  The patient also notes a "funny feeling" in his head that may indicate some diminished vestibular input.   The patient also notes a difficulty in placing the L LE, which could be due to dec'd coordination, or sensory changes in L LE (sensation diminished on L).  PT to address multi-sensory balance deficit, motor weakness and abnormal gait mechanics in order to improve safety with mobility.   He is at a high risk for falls and PT recommended he use SPC at this time.    History and Personal Factors relevant to plan of care:  diabetes, h/o drinking, back surgery 2009, 2014, frequent falls, recent fall with injury    Clinical Presentation  Evolving    Clinical Presentation due to:  10 falls in past 6 months    Clinical Decision Making  Moderate    Rehab Potential  Good    PT Frequency  2x / week  eval +   PT Duration  8 weeks    PT Treatment/Interventions  ADLs/Self Care Home Management;Therapeutic exercise;Balance training;Neuromuscular re-education;Gait training;Stair training;Functional mobility training;Therapeutic activities;Orthotic Fit/Training;Patient/family education;DME Instruction;Vestibular;Manual techniques    PT Next Visit Plan  Establish HEP for R hip abduction, bilateral ankle DF and PF, standing balance (emphasizing posture as well)-- corner with eyes open/eyes closed, narrowing base of support with support.  EMPHASIZE SAFETY with gait and  mobility (recommended patient bring cane).  May need to consider R AFO?  First want to emphasize hip strengthening R to see if we can carryover stability to gait.    Consulted and Agree with Plan of Care  Patient       Patient will benefit from skilled therapeutic intervention in order to improve the following deficits and impairments:  Abnormal gait, Impaired sensation, Decreased balance, Decreased mobility, Difficulty walking, Decreased safety awareness, Postural dysfunction, Pain, Decreased strength  Visit Diagnosis: Other abnormalities of gait and mobility  Unsteadiness on feet  Muscle weakness (generalized)  Foot drop, right  Repeated falls     Problem List Patient Active Problem List   Diagnosis Date Noted  . Adenomatous duodenal polyp   . Diarrhea 05/05/2015  . GERD 12/01/2007  . DYSPHAGIA UNSPECIFIED 12/01/2007  . INTERNAL HEMORRHOIDS 03/06/2007  . BARRETTS ESOPHAGUS 01/29/2007  . DIVERTICULOSIS, COLON 08/01/2005  . COLONIC POLYPS, ADENOMATOUS 07/09/2002    Aeriana Speece, PT 04/04/2018, 4:16 PM  [facility] [address] [place], [place], [address] Phone: [phone]   Fax:  [phone]  Name: Robert Castro MRN: [id number] Date of Birth: Dec 22, 1950

## 2018-04-11 ENCOUNTER — Encounter: Payer: Self-pay | Admitting: Physical Therapy

## 2018-04-11 ENCOUNTER — Ambulatory Visit (INDEPENDENT_AMBULATORY_CARE_PROVIDER_SITE_OTHER): Payer: Medicare Other | Admitting: Gastroenterology

## 2018-04-11 ENCOUNTER — Ambulatory Visit: Payer: Medicare Other | Admitting: Physical Therapy

## 2018-04-11 DIAGNOSIS — R2689 Other abnormalities of gait and mobility: Secondary | ICD-10-CM | POA: Diagnosis not present

## 2018-04-11 DIAGNOSIS — K709 Alcoholic liver disease, unspecified: Secondary | ICD-10-CM

## 2018-04-11 DIAGNOSIS — M21371 Foot drop, right foot: Secondary | ICD-10-CM

## 2018-04-11 DIAGNOSIS — Z23 Encounter for immunization: Secondary | ICD-10-CM | POA: Diagnosis not present

## 2018-04-11 DIAGNOSIS — R2681 Unsteadiness on feet: Secondary | ICD-10-CM

## 2018-04-11 DIAGNOSIS — M6281 Muscle weakness (generalized): Secondary | ICD-10-CM

## 2018-04-11 NOTE — Therapy (Signed)
[facility] [address] [place], [place], [address] Phone: [phone]   Fax:  [phone]  Physical Therapy Treatment  Patient Details  Name: KIM LAUVER MRN: [id number] Date of Birth: Oct 25, 1950 Referring Provider: Bing Matter, PA   Encounter Date: 04/11/2018  PT End of Session - 04/11/18 1413    Visit Number  2    Number of Visits  17    Date for PT Re-Evaluation  06/03/18    Authorization Type  medicare and AARP    PT Start Time  1316    PT Stop Time  1400    PT Time Calculation (min)  44 min    Activity Tolerance  Patient tolerated treatment well    Behavior During Therapy  [facility] for tasks assessed/performed       Past Medical History:  Diagnosis Date  . Adenomatous duodenal polyp   . Arthritis   . Barrett's esophagus   . Cataract    both eyes  . 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 Depression   . Diabetes ([place])   . Diverticulosis   . Fatty liver   . GERD (gastroesophageal reflux disease)   . Hyperlipidemia   . Hypertension   . Hypothyroidism   . Internal hemorrhoids   . Leg edema, left   . Osteoarthritis   . Sleep apnea    cpap  . Testosterone deficiency   . Tremors of nervous system   . Umbilical hernia     Past Surgical History:  Procedure Laterality Date  . BACK SURGERY  2009  . CATARACT EXTRACTION, BILATERAL  12/2016  . ESOPHAGOGASTRODUODENOSCOPY (EGD) WITH PROPOFOL N/A 09/16/2017   Procedure: ESOPHAGOGASTRODUODENOSCOPY (EGD) WITH PROPOFOL;  Surgeon: Yetta Flock, MD;  Location: [facility];  Service: Gastroenterology;  Laterality: N/A;  . LUMBAR LAMINECTOMY/ DECOMPRESSION WITH MET-RX  2014  . TONSILLECTOMY  1956  . UMBILICAL HERNIA REPAIR      There were no vitals filed for this visit.  Subjective Assessment - 04/11/18 1318    Subjective  His wrist is not the issue, he jammed his left thumb and its better. Doing well today  and didnt think he needed his cane.    Pertinent History  diabetes, h/o drinking, back surgery 2009, 2014    Patient Stated Goals  "the biggest thing is that my walking has been greatly impaired by this.  I can't keep up with everyone."  He reports having to turn sideways to go down stairs.  He also notes a funny feeling in his head.     Currently in Pain?  No/denies    Pain Onset  --                       [facility] Adult PT Treatment/Exercise - 04/11/18 1407      Ambulation/Gait   Ambulation/Gait  Yes    Ambulation/Gait Assistance  5: Supervision    Ambulation/Gait Assistance Details  educated in use of SPC with quad tip; tried both left and right hand with pt agreeing he does better with cane in his left hand    Ambulation Distance (Feet)  100 Feet   480   Assistive device  Straight cane   with quad tip   Gait Pattern  Trendelenburg;Decreased stance time - right;Decreased step length - left;Decreased stride length;Lateral hip instability;Lateral trunk lean to left;Poor foot clearance - right;Decreased weight shift to right;Decreased dorsiflexion -  right   foot slap audible on right   Ambulation Surface  Indoor    Gait velocity    Stairs  --    Stairs Assistance  --    Stair Management Technique  --    Number of Stairs  --      Exercises   Exercises  Knee/Hip;Ankle      Knee/Hip Exercises: Standing   Heel Raises  Both;2 sets;10 reps;3 seconds    Heel Raises Limitations  alternated with toe raises    Hip Abduction  Stengthening;Both;1 set;20 reps;Knee straight   3 sec hold   Abduction Limitations  vc for proper alignment      Knee/Hip Exercises: Sidelying   Clams  red band; 10 reps x 2 sets bil       Ankle Exercises: Seated   Heel Raises  Both;10 reps    Toe Raise  10 reps             PT Education - 04/11/18 1412    Education Details  purpose of cane to reduce falls; need to use at all times (including in the house for right now); HEP; neuropathy is not  going to get better and can get worse if not following diet recommendations of MD (including no alcohol)    Person(s) Educated  Patient    Methods  Explanation;Demonstration;Verbal cues;Handout    Comprehension  Verbalized understanding;Returned demonstration;Verbal cues required;Tactile cues required;Need further instruction       PT Short Term Goals - 04/04/18 1015      PT SHORT TERM GOAL #1   Title  The patient will return demo HEP for right hip abduction strengthening, ankle strengthening and balance near support surface.  (STGs due 05/03/18)    Time  4    Period  Weeks    Target Date  05/03/18      PT SHORT TERM GOAL #2   Title  The patient will improve Berg from 32/56 to > or equal to 38/56 to demo dec'ing risk for falls.    Time  4    Period  Weeks    Target Date  05/03/18      PT SHORT TERM GOAL #3   Title  The patient will ambulate with SPC x 400 ft mod indep without loss of balance.    Baseline  Uses no device and needs supervision to CGA.    Time  4    Period  Weeks    Target Date  05/03/18      PT SHORT TERM GOAL #4   Title  The patient will negotiate 4 steps with one handrail and reciprocal pattern mod indep.    Baseline  The patient needs supervision on steps    Time  4    Period  Weeks    Target Date  05/03/18      PT SHORT TERM GOAL #5   Title  Trial AFOs R LE to determine if this improves functional ambulation.    Time  4    Period  Weeks    Target Date  05/03/18        PT Long Term Goals - 04/04/18 1209      PT LONG TERM GOAL #1   Title  The patient will be indep with progression of HEP.    Time  8    Period  Weeks    Target Date  06/02/18      PT LONG TERM GOAL #2   Title  The patient will improve Berg balance scale from 32/56 to > or equal to 42/56 to demo dec'ing risk for falls.    Time  8    Period  Weeks    Target Date  06/02/18      PT LONG TERM GOAL #3   Title  The patinet will improve gait speed from 2.29 ft/sec to > or equal to 2.62  ft/sec to demo transition to full community ambulator classification of gait.    Time  8    Period  Weeks    Target Date  06/02/18      PT LONG TERM GOAL #4   Title  The patient will negotiate community surfaces including curbs, inclines, and paved surfaces x 1000 ft with SPC mod indep.    Time  8    Period  Weeks    Target Date  06/02/18            Plan - 04/11/18 1414    Clinical Impression Statement  Session focused on gait training with SPC and pt intially with difficulty coordinating use of cane in left hand. With cues and repetition he was able to self-correct (including stopping when he was "off" and restarting). Remainder of session included LE exercises to address strength deficits and balance deficits. Handout created and provided for HEP. Patient very appreciative.     Rehab Potential  Good    PT Frequency  2x / week   eval +   PT Duration  8 weeks    PT Treatment/Interventions  ADLs/Self Care Home Management;Therapeutic exercise;Balance training;Neuromuscular re-education;Gait training;Stair training;Functional mobility training;Therapeutic activities;Orthotic Fit/Training;Patient/family education;DME Instruction;Vestibular;Manual techniques    PT Next Visit Plan  check his cane (he SHOULD bring it!) Check understanding of HEP for R hip abduction, bilateral ankle DF and PF, standing balance (emphasizing posture as well)-- ? add corner with eyes open/eyes closed, narrowing base of support with support. May need to consider R AFO?  First want to emphasize hip strengthening R to see if we can carryover stability to gait.    Consulted and Agree with Plan of Care  Patient       Patient will benefit from skilled therapeutic intervention in order to improve the following deficits and impairments:  Abnormal gait, Impaired sensation, Decreased balance, Decreased mobility, Difficulty walking, Decreased safety awareness, Postural dysfunction, Pain, Decreased strength  Visit  Diagnosis: Other abnormalities of gait and mobility  Unsteadiness on feet  Muscle weakness (generalized)  Foot drop, right     Problem List Patient Active Problem List   Diagnosis Date Noted  . Adenomatous duodenal polyp   . Diarrhea 05/05/2015  . GERD 12/01/2007  . DYSPHAGIA UNSPECIFIED 12/01/2007  . INTERNAL HEMORRHOIDS 03/06/2007  . BARRETTS ESOPHAGUS 01/29/2007  . DIVERTICULOSIS, COLON 08/01/2005  . COLONIC POLYPS, ADENOMATOUS 07/09/2002    Rexanne Mano, PT 04/11/2018, 2:19 PM  [facility] [address] [place], [place], [address] Phone: [phone]   Fax:  [phone]  Name: TYREKE KAESER MRN: [id number] Date of Birth: [birth year]

## 2018-04-11 NOTE — Patient Instructions (Addendum)
   Use the cane in your left hand. Cane and right foot go together.  Access Code: [email or web address]  URL: [email or web address]  Date: 04/11/2018  Prepared by: Barry Brunner   Exercises  Clamshell with Resistance - 10 reps - 2 sets - 3 sec hold - 1x daily - 5x weekly  Standing Hip Abduction with Counter Support - 20 reps - 1 sets - 3 seconds hold - 3x daily - 5x weekly  Heel Toe Raises with Counter Support - 10 reps - 1 sets - 3x daily - 5x weekly  Seated Toe Raise - 30 reps - 1 sets - 1-3x daily - 5x weekly

## 2018-04-14 ENCOUNTER — Encounter: Payer: Self-pay | Admitting: Physical Therapy

## 2018-04-14 ENCOUNTER — Ambulatory Visit: Payer: Medicare Other | Admitting: Physical Therapy

## 2018-04-14 DIAGNOSIS — R2681 Unsteadiness on feet: Secondary | ICD-10-CM

## 2018-04-14 DIAGNOSIS — M6281 Muscle weakness (generalized): Secondary | ICD-10-CM

## 2018-04-14 DIAGNOSIS — R2689 Other abnormalities of gait and mobility: Secondary | ICD-10-CM | POA: Diagnosis not present

## 2018-04-14 NOTE — Therapy (Signed)
[facility] [address] [place], [place], [address] Phone: [phone]   Fax:  [phone]  Physical Therapy Treatment  Patient Details  Name: Robert Castro MRN: [id number] Date of Birth: 1950/12/08 Referring Provider: Bing Matter, PA   Encounter Date: 04/14/2018  PT End of Session - 04/14/18 1312    Visit Number  3    Number of Visits  17    Date for PT Re-Evaluation  06/03/18    Authorization Type  medicare and AARP    PT Start Time  1151    PT Stop Time  1229    PT Time Calculation (min)  38 min    Activity Tolerance  Patient tolerated treatment well    Behavior During Therapy  [facility] for tasks assessed/performed       Past Medical History:  Diagnosis Date  . Adenomatous duodenal polyp   . Arthritis   . Barrett's esophagus   . Cataract    both eyes  . 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 Depression   . Diabetes ([place])   . Diverticulosis   . Fatty liver   . GERD (gastroesophageal reflux disease)   . Hyperlipidemia   . Hypertension   . Hypothyroidism   . Internal hemorrhoids   . Leg edema, left   . Osteoarthritis   . Sleep apnea    cpap  . Testosterone deficiency   . Tremors of nervous system   . Umbilical hernia     Past Surgical History:  Procedure Laterality Date  . BACK SURGERY  2009  . CATARACT EXTRACTION, BILATERAL  12/2016  . ESOPHAGOGASTRODUODENOSCOPY (EGD) WITH PROPOFOL N/A 09/16/2017   Procedure: ESOPHAGOGASTRODUODENOSCOPY (EGD) WITH PROPOFOL;  Surgeon: Yetta Flock, MD;  Location: [facility];  Service: Gastroenterology;  Laterality: N/A;  . LUMBAR LAMINECTOMY/ DECOMPRESSION WITH MET-RX  2014  . TONSILLECTOMY  1956  . UMBILICAL HERNIA REPAIR      There were no vitals filed for this visit.  Subjective Assessment - 04/14/18 1153    Subjective  Arrived without his cane and reports he did not use it over the weekend. No falls   (Pended)     Pertinent History  diabetes, h/o drinking, back surgery 2009, 2014  (Pended)     Patient Stated Goals  "the biggest thing is that my walking has been greatly impaired by this.  I can't keep up with everyone."  He reports having to turn sideways to go down stairs.  He also notes a funny feeling in his head.   (Pended)     Currently in Pain?  No/denies  (Pended)                        [facility] Adult PT Treatment/Exercise - 04/14/18 1308      Ambulation/Gait   Ambulation/Gait Assistance  5: Supervision;4: Min guard    Ambulation/Gait Assistance Details  vc for proper use of cane; minguard due to unsteadiness    Ambulation Distance (Feet)  80 Feet   120, 50, 50   Assistive device  Straight cane;None    Gait Pattern  Trendelenburg;Decreased stance time - right;Decreased step length - left;Decreased stride length;Lateral hip instability;Lateral trunk lean to left;Poor foot clearance - right;Decreased weight shift to right;Decreased dorsiflexion - right    Ambulation Surface  Indoor      Knee/Hip Exercises: Standing   Hip Abduction  Stengthening;Both;1 set;20 reps  Abduction Limitations  vc for proper technique      Knee/Hip Exercises: Sidelying   Clams  vc for technique; RLE x 10 reps          Balance Exercises - 04/14/18 1305      Balance Exercises: Standing   Wall Bumps  Hip    Wall Bumps-Hips  Eyes opened;Anterior/posterior;10 reps    Rockerboard  Anterior/posterior;EO;Intermittent UE support   PF hold 5 sec; DF hold 5 sec alternating for balance   Step Ups  Forward;4 inch;UE support 1   10 reps each leg   Retro Gait  Upper extremity support;2 reps    Marching Limitations  slow march forwards intermittent UE support x 2        PT Education - 04/14/18 1310    Education Details  per pt request, reprinted HEP and reviewed standing and sidelying hip abduction (he was able to correctly demonstrate other exercises for 2-3 reps); again educated to use  cane AT ALL TIMES (even inside home to help him become more proficient with cane)    Person(s) Educated  Patient    Methods  Explanation;Demonstration;Verbal cues;Tactile cues;Handout    Comprehension  Verbalized understanding;Returned demonstration;Verbal cues required;Tactile cues required;Need further instruction       PT Short Term Goals - 04/04/18 1015      PT SHORT TERM GOAL #1   Title  The patient will return demo HEP for right hip abduction strengthening, ankle strengthening and balance near support surface.  (STGs due 05/03/18)    Time  4    Period  Weeks    Target Date  05/03/18      PT SHORT TERM GOAL #2   Title  The patient will improve Berg from 32/56 to > or equal to 38/56 to demo dec'ing risk for falls.    Time  4    Period  Weeks    Target Date  05/03/18      PT SHORT TERM GOAL #3   Title  The patient will ambulate with SPC x 400 ft mod indep without loss of balance.    Baseline  Uses no device and needs supervision to CGA.    Time  4    Period  Weeks    Target Date  05/03/18      PT SHORT TERM GOAL #4   Title  The patient will negotiate 4 steps with one handrail and reciprocal pattern mod indep.    Baseline  The patient needs supervision on steps    Time  4    Period  Weeks    Target Date  05/03/18      PT SHORT TERM GOAL #5   Title  Trial AFOs R LE to determine if this improves functional ambulation.    Time  4    Period  Weeks    Target Date  05/03/18        PT Long Term Goals - 04/04/18 1209      PT LONG TERM GOAL #1   Title  The patient will be indep with progression of HEP.    Time  8    Period  Weeks    Target Date  06/02/18      PT LONG TERM GOAL #2   Title  The patient will improve Berg balance scale from 32/56 to > or equal to 42/56 to demo dec'ing risk for falls.    Time  8    Period  Weeks  Target Date  06/02/18      PT LONG TERM GOAL #3   Title  The patinet will improve gait speed from 2.29 ft/sec to > or equal to 2.62 ft/sec to  demo transition to full community ambulator classification of gait.    Time  8    Period  Weeks    Target Date  06/02/18      PT LONG TERM GOAL #4   Title  The patient will negotiate community surfaces including curbs, inclines, and paved surfaces x 1000 ft with SPC mod indep.    Time  8    Period  Weeks    Target Date  06/02/18            Plan - 04/14/18 1313    Clinical Impression Statement  Session focused on assuring pt could correctly do his HEP (including re-printing handout as he could not find the last one), gait training with SPC with quad rubber tip, balance training and LE strengthening. Patient requires verbal and tactile cues for proper form with exercises. At end of session, pt inquired re: foot braces that were mentioned on eval to assist with rt foot clearance. Educated we have some trial braces here and can address this next session with pt in agreement.     Rehab Potential  Good    PT Frequency  2x / week   eval +   PT Duration  8 weeks    PT Treatment/Interventions  ADLs/Self Care Home Management;Therapeutic exercise;Balance training;Neuromuscular re-education;Gait training;Stair training;Functional mobility training;Therapeutic activities;Orthotic Fit/Training;Patient/family education;DME Instruction;Vestibular;Manual techniques    PT Next Visit Plan  check his cane (he SHOULD bring it!); work on gait with cane (ramp, curb, steps, unlevel); pt wants to try braces for rt foot drop--try foot-up first; ? add corner with eyes open/eyes closed, narrowing base of support. Work on right hip and bil ankle strengthening     Consulted and Agree with Plan of Care  Patient       Patient will benefit from skilled therapeutic intervention in order to improve the following deficits and impairments:  Abnormal gait, Impaired sensation, Decreased balance, Decreased mobility, Difficulty walking, Decreased safety awareness, Postural dysfunction, Pain, Decreased strength  Visit  Diagnosis: Other abnormalities of gait and mobility  Unsteadiness on feet  Muscle weakness (generalized)     Problem List Patient Active Problem List   Diagnosis Date Noted  . Adenomatous duodenal polyp   . Diarrhea 05/05/2015  . GERD 12/01/2007  . DYSPHAGIA UNSPECIFIED 12/01/2007  . INTERNAL HEMORRHOIDS 03/06/2007  . BARRETTS ESOPHAGUS 01/29/2007  . DIVERTICULOSIS, COLON 08/01/2005  . COLONIC POLYPS, ADENOMATOUS 07/09/2002    Rexanne Mano, PT 04/14/2018, 3:44 PM  [facility] [address] [place], [place], [address] Phone: [phone]   Fax:  [phone]  Name: Robert Castro MRN: [id number] Date of Birth: 07/01/1951

## 2018-04-21 ENCOUNTER — Ambulatory Visit: Payer: Medicare Other | Admitting: Physical Therapy

## 2018-04-21 ENCOUNTER — Encounter: Payer: Self-pay | Admitting: Physical Therapy

## 2018-04-21 DIAGNOSIS — R2689 Other abnormalities of gait and mobility: Secondary | ICD-10-CM

## 2018-04-21 DIAGNOSIS — M21371 Foot drop, right foot: Secondary | ICD-10-CM

## 2018-04-21 DIAGNOSIS — M6281 Muscle weakness (generalized): Secondary | ICD-10-CM

## 2018-04-22 ENCOUNTER — Inpatient Hospital Stay: Payer: Medicare Other

## 2018-04-22 ENCOUNTER — Telehealth: Payer: Self-pay | Admitting: Physical Therapy

## 2018-04-22 ENCOUNTER — Inpatient Hospital Stay: Payer: Medicare Other | Attending: Genetic Counselor | Admitting: Genetics

## 2018-04-22 DIAGNOSIS — D126 Benign neoplasm of colon, unspecified: Secondary | ICD-10-CM | POA: Diagnosis not present

## 2018-04-22 DIAGNOSIS — D132 Benign neoplasm of duodenum: Secondary | ICD-10-CM

## 2018-04-22 DIAGNOSIS — Z800 Family history of malignant neoplasm of digestive organs: Secondary | ICD-10-CM

## 2018-04-22 DIAGNOSIS — Z7183 Encounter for nonprocreative genetic counseling: Secondary | ICD-10-CM

## 2018-04-22 NOTE — Therapy (Signed)
[facility] [address] [place], [place], [address] Phone: [phone]   Fax:  [phone]  Physical Therapy Treatment  Patient Details  Name: Robert Castro MRN: [id number] Date of Birth: Dec 27, 1950 Referring Provider: Bing Matter, PA   Encounter Date: 04/21/2018  PT End of Session - 04/22/18 1252    Visit Number  4    Number of Visits  17    Date for PT Re-Evaluation  06/03/18    Authorization Type  medicare and AARP    PT Start Time  1450    PT Stop Time  1528    PT Time Calculation (min)  38 min    Activity Tolerance  Patient tolerated treatment well    Behavior During Therapy  [facility] for tasks assessed/performed       Past Medical History:  Diagnosis Date  . Adenomatous duodenal polyp   . Arthritis   . Barrett's esophagus   . Cataract    both eyes  . 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 Depression   . Diabetes ([place])   . Diverticulosis   . Fatty liver   . GERD (gastroesophageal reflux disease)   . Hyperlipidemia   . Hypertension   . Hypothyroidism   . Internal hemorrhoids   . Leg edema, left   . Osteoarthritis   . Sleep apnea    cpap  . Testosterone deficiency   . Tremors of nervous system   . Umbilical hernia     Past Surgical History:  Procedure Laterality Date  . BACK SURGERY  2009  . CATARACT EXTRACTION, BILATERAL  12/2016  . ESOPHAGOGASTRODUODENOSCOPY (EGD) WITH PROPOFOL N/A 09/16/2017   Procedure: ESOPHAGOGASTRODUODENOSCOPY (EGD) WITH PROPOFOL;  Surgeon: Yetta Flock, MD;  Location: [facility];  Service: Gastroenterology;  Laterality: N/A;  . LUMBAR LAMINECTOMY/ DECOMPRESSION WITH MET-RX  2014  . TONSILLECTOMY  1956  . UMBILICAL HERNIA REPAIR      There were no vitals filed for this visit.  Subjective Assessment - 04/21/18 1456    Subjective  Continuing to lose weight and blood sugars have been better. Asking about braces to  help his foot drop.     Pertinent History  diabetes, h/o drinking, back surgery 2009, 2014    Patient Stated Goals  "the biggest thing is that my walking has been greatly impaired by this.  I can't keep up with everyone."  He reports having to turn sideways to go down stairs.  He also notes a funny feeling in his head.     Currently in Pain?  No/denies         Treatment- Patient inquiring re: braces for his legs to reduce his risk of falling. He has longstanding rt foot drop and very weak left plantarflexors. Explained need to address foot drop with bracing, but not his LLE. Initially tried to set pt up with [place] foot-up brace, however his elastic laces on his sneakers were not able to be unlaced and re-laced with the attachment. Next tried [employer] Step (posterior support) AFO with lateral strut. This improved his foot clearance and he liked the way it felt, however noted a mild tendency to invert. Switched to [place] AFO with medial strut with good foot clearance.   Throughout, educated on proper use of cane in his LEFT hand due to rt hip weakness and rt foot drop. (He entered using cane in his right hand and insisted that was what  I told him to do--and then he pulled out the instructions I wrote out for him that stated to use in his left hand). Patient's sequencing improving with practice.                         PT Short Term Goals - 04/04/18 1015      PT SHORT TERM GOAL #1   Title  The patient will return demo HEP for right hip abduction strengthening, ankle strengthening and balance near support surface.  (STGs due 05/03/18)    Time  4    Period  Weeks    Target Date  05/03/18      PT SHORT TERM GOAL #2   Title  The patient will improve Berg from 32/56 to > or equal to 38/56 to demo dec'ing risk for falls.    Time  4    Period  Weeks    Target Date  05/03/18      PT SHORT TERM GOAL #3   Title  The patient will ambulate with SPC x 400 ft mod indep without  loss of balance.    Baseline  Uses no device and needs supervision to CGA.    Time  4    Period  Weeks    Target Date  05/03/18      PT SHORT TERM GOAL #4   Title  The patient will negotiate 4 steps with one handrail and reciprocal pattern mod indep.    Baseline  The patient needs supervision on steps    Time  4    Period  Weeks    Target Date  05/03/18      PT SHORT TERM GOAL #5   Title  Trial AFOs R LE to determine if this improves functional ambulation.    Time  4    Period  Weeks    Target Date  05/03/18        PT Long Term Goals - 04/04/18 1209      PT LONG TERM GOAL #1   Title  The patient will be indep with progression of HEP.    Time  8    Period  Weeks    Target Date  06/02/18      PT LONG TERM GOAL #2   Title  The patient will improve Berg balance scale from 32/56 to > or equal to 42/56 to demo dec'ing risk for falls.    Time  8    Period  Weeks    Target Date  06/02/18      PT LONG TERM GOAL #3   Title  The patinet will improve gait speed from 2.29 ft/sec to > or equal to 2.62 ft/sec to demo transition to full community ambulator classification of gait.    Time  8    Period  Weeks    Target Date  06/02/18      PT LONG TERM GOAL #4   Title  The patient will negotiate community surfaces including curbs, inclines, and paved surfaces x 1000 ft with SPC mod indep.    Time  8    Period  Weeks    Target Date  06/02/18            Plan - 04/22/18 1254    Clinical Impression Statement  Session focused on gait training including proper use of his cane (assured it was set at correct height for him) and use of different AFOs to  allow pt to feel the assist it can give him and improve his safety with walking. Patient was unable to try a simple foot-up brace due to his sneakers elastic shoe laces could not be separated, unlaced and re-laced with the piece that attaches to the shoe. He was shown this brace and feels he needs to go ahead with AFO as his condition will  continue to worsen/progress. Advised pt I will contact his referring physician to begin the process. Informed he may need to have a face-to-face appointment with physicican due to Medicare requirements.     Rehab Potential  Good    PT Frequency  2x / week   eval +   PT Duration  8 weeks    PT Treatment/Interventions  ADLs/Self Care Home Management;Therapeutic exercise;Balance training;Neuromuscular re-education;Gait training;Stair training;Functional mobility training;Therapeutic activities;Orthotic Fit/Training;Patient/family education;DME Instruction;Vestibular;Manual techniques    PT Next Visit Plan  follow-up to see if MD/NP has ordered AFO; work on gait with cane and rt [place] AFO with medial strut (he has slight inversion tendency) ramp, curb, steps, unlevel; ? add corner exercises to HEP with eyes open/eyes closed, narrowing base of support. Work on right hip and bil ankle strengthening     Consulted and Agree with Plan of Care  Patient       Patient will benefit from skilled therapeutic intervention in order to improve the following deficits and impairments:  Abnormal gait, Impaired sensation, Decreased balance, Decreased mobility, Difficulty walking, Decreased safety awareness, Postural dysfunction, Pain, Decreased strength  Visit Diagnosis: Other abnormalities of gait and mobility  Muscle weakness (generalized)  Foot drop, right     Problem List Patient Active Problem List   Diagnosis Date Noted  . Adenomatous duodenal polyp   . Diarrhea 05/05/2015  . GERD 12/01/2007  . DYSPHAGIA UNSPECIFIED 12/01/2007  . INTERNAL HEMORRHOIDS 03/06/2007  . BARRETTS ESOPHAGUS 01/29/2007  . DIVERTICULOSIS, COLON 08/01/2005  . COLONIC POLYPS, ADENOMATOUS 07/09/2002    Rexanne Mano, PT 04/22/2018, 1:06 PM  [facility] [address] [place], [place], [address] Phone: [phone]   Fax:  [phone]  Name: GWYN MEHRING MRN: [id number] Date of Birth: March 20, 1951

## 2018-04-22 NOTE — Telephone Encounter (Signed)
Bing Matter, PA-C,   Jibreel Fedewa was evaluated by Physical Therapy  on 04/03/18.  The patient would benefit from an orthotist consult for evaluation for a right ankle-foot-orthosis (AFO) due to foot drop and high fall risk.     If you agree, please place an order in [facility] in [facility] (Orthotist consult for right AFO" or fax the order to [phone]. Thank you,   Barry Brunner, PT Outpatient Neurorehabilitation [address] [place], [place] [address] [phone]

## 2018-04-23 ENCOUNTER — Encounter: Payer: Self-pay | Admitting: Genetics

## 2018-04-23 NOTE — Progress Notes (Signed)
REFERRING PROVIDER: Yetta Flock, MD [address] [place], [place] [address]  PRIMARY PROVIDER:  Aletha Halim., PA-C  PRIMARY REASON FOR VISIT:  1. Benign neoplasm of colon, unspecified part of colon   2. Adenomatous duodenal polyp     HISTORY OF PRESENT ILLNESS:   Robert Castro, a 67 y.o. male, was seen for a [place] cancer genetics consultation at the request of Dr. Havery Moros due to a personal history of colon polyps.  Robert Castro presents to clinic today to discuss the possibility of a hereditary predisposition to cancer, genetic testing, and to further clarify his future cancer risks, as well as potential cancer risks for family members.   Robert Castro has had 23+ colon adenomas and 1 duodenal adenoma over the past several years.  Robert Castro has multiple polyps in the gastric antrum that were hyperplastic.     Past Medical History:  Diagnosis Date  . Adenomatous duodenal polyp   . Arthritis   . Barrett's esophagus   . Cataract    both eyes  . 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 Depression   . Diabetes ([place])   . Diverticulosis   . Fatty liver   . GERD (gastroesophageal reflux disease)   . Hyperlipidemia   . Hypertension   . Hypothyroidism   . Internal hemorrhoids   . Leg edema, left   . Osteoarthritis   . Sleep apnea    cpap  . Testosterone deficiency   . Tremors of nervous system   . Umbilical hernia     Past Surgical History:  Procedure Laterality Date  . BACK SURGERY  2009  . CATARACT EXTRACTION, BILATERAL  12/2016  . ESOPHAGOGASTRODUODENOSCOPY (EGD) WITH PROPOFOL N/A 09/16/2017   Procedure: ESOPHAGOGASTRODUODENOSCOPY (EGD) WITH PROPOFOL;  Surgeon: Yetta Flock, MD;  Location: [facility];  Service: Gastroenterology;  Laterality: N/A;  . LUMBAR LAMINECTOMY/ DECOMPRESSION WITH MET-RX  2014  . TONSILLECTOMY  1956  . UMBILICAL HERNIA REPAIR      Social History   Socioeconomic History  . Marital  status: Single    Spouse name: Not on file  . Number of children: Not on file  . Years of education: Not on file  . Highest education level: Not on file  Occupational History  . Not on file  Social Needs  . Financial resource strain: Not on file  . Food insecurity:    Worry: Not on file    Inability: Not on file  . Transportation needs:    Medical: Not on file    Non-medical: Not on file  Tobacco Use  . Smoking status: Former Smoker    Last attempt to quit: 08/25/2002    Years since quitting: 15.6  . Smokeless tobacco: Never Used  . Tobacco comment: in college only 40 years ago  Substance and Sexual Activity  . Alcohol use: Yes    Alcohol/week: 14.0 - 28.0 standard drinks    Types: 14 - 28 Standard drinks or equivalent per week    Comment: varies  daily drinker  . Drug use: No  . Sexual activity: Yes  Lifestyle  . Physical activity:    Days per week: Not on file    Minutes per session: Not on file  . Stress: Not on file  Relationships  . Social connections:    Talks on phone: Not on file    Gets together: Not on file    Attends religious service:  Not on file    Active member of club or organization: Not on file    Attends meetings of clubs or organizations: Not on file    Relationship status: Not on file  Other Topics Concern  . Not on file  Social History Narrative  . Not on file     FAMILY HISTORY:  We obtained a detailed, 4-generation family history.  Significant diagnoses are listed below: Family History  Problem Relation Age of Onset  . Atrial fibrillation Brother   . Cancer Mother 48       abdominal cancer, unk name of cancer type  . Heart attack Maternal Grandfather   . Stroke Maternal Grandfather   . Heart attack Paternal Grandfather   . Colon cancer Neg Hx   . Stomach cancer Neg Hx   . Colon polyps Neg Hx   . Esophageal cancer Neg Hx   . Rectal cancer Neg Hx     Robert Castro has no children.  Robert Castro has a brother who is 19 with no history of cancer.  Robert Castro has 2 children with no history of cancer.   Robert Castro father: died at 78 with no history of cancer, unk if Robert Castro ever had colon polyps or not   Paternal Aunts/Uncles: none- father was only child Paternal cousins: N/A Paternal grandfather: died of a heart attack Paternal grandmother:no history of cancer  Robert Castro mother: died at 64 due to an abdominal cancer.  Robert Castro could not recall the name of the type of cancer she had- Robert Castro reports they had to send her cancer to [facility] for dx it was an usual type of cancer.  Maternal Aunts/Uncles: 2 maternal aunts with no known history of cancer/polyps  Maternal cousins: no known history of cancer/polyps.  Maternal grandfather: died of heart attack/stroke Maternal grandmother: no known history of cancer  Robert Castro is unaware of previous family history of genetic testing for hereditary cancer risks. Patient's maternal ancestors are of Caucasian descent, and paternal ancestors are of Caucasian descent. There is no reported Ashkenazi Jewish ancestry. There I sno known consanguinity.  GENETIC COUNSELING ASSESSMENT: Robert Castro is a 67 y.o. male with a persoanl history which is somewhat suggestive of a Hereditary Cancer Predisposition Syndrome. We, therefore, discussed and recommended the following at today's visit.   DISCUSSION: We reviewed the characteristics, features and inheritance patterns of hereditary cancer syndromes. We also discussed genetic testing, including the appropriate family members to test, the process of testing, insurance coverage and turn-around-time for results. We discussed the implications of a negative, positive and/or variant of uncertain significant result. We recommended Robert Castro pursue genetic testing for the Multi-Cancer gene panel.   The Multi-Cancer Panel offered by [facility] includes sequencing and/or deletion duplication testing of the following 84 genes: AIP,ALK, APC, ATM, AXIN2,BAP1,  BARD1, BLM, BMPR1A, BRCA1,  BRCA2, BRIP1, CASR, CDC73, CDH1, CDK4, CDKN1B, CDKN1C, CDKN2A (p14ARF), CDKN2A (p16INK4a), CEBPA, CHEK2, CTNNA1, DICER1, DIS3L2, EGFR (c.2369C>T, p.Thr790Met variant only), EPCAM (Deletion/duplication testing only), FH, FLCN, GATA2, GPC3, GREM1 (Promoter region deletion/duplication testing only), HOXB13 (c.251G>A, p.Gly84Glu), HRAS, KIT, MAX, MEN1, MET, MITF (c.952G>A, p.Glu318Lys variant only), MLH1, MSH2, MSH3, MSH6, MUTYH, NBN, NF1, NF2, NTHL1, PALB2, PDGFRA, PHOX2B, PMS2, POLD1, POLE, POT1, PRKAR1A, PTCH1, PTEN, RAD50, RAD51C, RAD51D, RB1, RECQL4, RET, RUNX1, SDHAF2, SDHA (sequence changes only), SDHB, SDHC, SDHD, SMAD4, SMARCA4, SMARCB1, SMARCE1, STK11, SUFU, TERC, TERT, TMEM127, TP53, TSC1, TSC2, VHL, WRN and WT1.   When an individual develops multiple adenomatous colon polyps, there is concern  for a condition called Familial Adenomatous Polyposis (FAP).   In the classic form of FAP, people generally have hundreds to thousands of adenomatous polyps.  A milder version of FAP called Attenuated FAP (AFAP) is characterized by less than 100 adenomatous polyps.   There are two genes that have been associated with FAP/AFAP and each has a different pattern of inheritance.  The first gene is called APC and is inherited in an autosomal dominant fashion.  In this case, having only one alteration (mutation) in one copy of the APC gene causes polyps to develop.  In about 30% of cases caused by APC, the condition is diagnosed for the first time in a family where both parents do not have the condition.  In other words, there are 30% of people with FAP/AFAP where the mutation occurred in them for the first time.     Colon polyposis can also be caused by mutations in the MUTYH gene, which causes a condition known as MUTYH-associated polyposis.  This is an autosomal recessive genetic condition.  In this case, an individual needs to have inherited a mutation in each copy of the MYH gene, one from each parent, in order to  develop polyposis.  Carrying just one mutation of the MYH gene does not typically cause any problems or place an individual at risk for cancer.    Another syndrome we were concerned about in your family is called Lynch Syndrome, also called HNPCC (Hereditary Non-Polyposis Colon Cancer).  This syndrome increases the risk for colon, uterine, ovarian and stomach cancers, brain cancers, as well as others.  Families with Lynch Syndrome tend to have multiple family members with these cancers, typically diagnosed under age 63, and diagnoses in multiple generations. The genes that are known to cause Lynch Syndrome are called MLH1, MSH2, MSH6, PMS2 and EPCAM.     Some families that appear to have any of these conditions will have normal testing of these genes.  In those cases it is possible that the large amount of colon polyps may be causes by a syndrome called Serrated Polyposis Syndrome.  This condition causes an abnormally large amount of serrate polyps to develop and believe to be hereditary.  However, the genetic cause of this syndrome has not yet been identified.     We discussed that if Robert Castro is found to have a mutation in one of these genes, it may impact future medical management recommendations such as increased cancer screenings and consideration of risk reducing surgeries.  A positive result could also have implications for the patient's family members.   A Negative result would mean we were unable to identify a hereditary component to his development of adenomatous polyps, but does not rule out the possibility of a hereditary basis for his polyps. There could be mutations that are undetectable by current technology, or in genes not yet tested or identified to increase cancer risk.     We discussed the potential to find a Variant of Uncertain Significance or VUS.  These are variants that have not yet been identified as pathogenic or benign, and it is unknown if this variant is associated with increased  cancer risk or if this is a normal finding.  Most VUS's are reclassified to benign or likely benign.   It should not be used to make medical management decisions. With time, we suspect the lab will determine the significance of any VUS's identified if any.   Based on Robert Castro personal history of cancer, Robert Castro meets  medical criteria for genetic testing. Despite that Robert Castro meets criteria, Robert Castro may still have an out of pocket cost. The laboratory can provide him with an estimate of his OOP cost.  hospital was provided the contact information for the laboratory if hospital has further questions.   PLAN: After considering the risks, benefits, and limitations, Robert Castro  provided informed consent to pursue genetic testing and the blood sample was sent to [facility] for analysis of the Multi-Cancer Panel. Results should be available within approximately 2-3 weeks' time, at which point they will be disclosed by telephone to Robert Castro, as will any additional recommendations warranted by these results. Robert Castro will receive a summary of his genetic counseling visit and a copy of his results once available. This information will also be available in Epic. We encouraged Robert Castro to remain in contact with cancer genetics annually so that we can continuously update the family history and inform him of any changes in cancer genetics and testing that may be of benefit for his family. Robert Castro questions were answered to his satisfaction today. Our contact information was provided should additional questions or concerns arise.  Lastly, we encouraged Mr. Birchard to remain in contact with cancer genetics annually so that we can continuously update the family history and inform him of any changes in cancer genetics and testing that may be of benefit for this family.   Mr.  Goodchild questions were answered to his satisfaction today. Our contact information was provided should additional questions or concerns arise. Thank  you for the referral and allowing [place] to share in the care of your patient.   Tana Felts, MS, [facility] Certified Genetic Counselor [email or web address] phone: [phone]  The patient was seen for a total of 30 minutes in face-to-face genetic counseling.

## 2018-04-25 ENCOUNTER — Ambulatory Visit: Payer: Medicare Other | Admitting: Physical Therapy

## 2018-05-01 ENCOUNTER — Ambulatory Visit: Payer: Self-pay | Admitting: Genetics

## 2018-05-01 ENCOUNTER — Encounter: Payer: Self-pay | Admitting: Genetics

## 2018-05-01 ENCOUNTER — Encounter: Payer: Self-pay | Admitting: Physical Therapy

## 2018-05-01 ENCOUNTER — Telehealth: Payer: Self-pay | Admitting: Genetics

## 2018-05-01 ENCOUNTER — Ambulatory Visit: Payer: Medicare Other | Attending: Family Medicine | Admitting: Physical Therapy

## 2018-05-01 DIAGNOSIS — M6281 Muscle weakness (generalized): Secondary | ICD-10-CM | POA: Diagnosis present

## 2018-05-01 DIAGNOSIS — R2681 Unsteadiness on feet: Secondary | ICD-10-CM

## 2018-05-01 DIAGNOSIS — R296 Repeated falls: Secondary | ICD-10-CM | POA: Diagnosis present

## 2018-05-01 DIAGNOSIS — D132 Benign neoplasm of duodenum: Secondary | ICD-10-CM

## 2018-05-01 DIAGNOSIS — Z1379 Encounter for other screening for genetic and chromosomal anomalies: Secondary | ICD-10-CM | POA: Insufficient documentation

## 2018-05-01 DIAGNOSIS — M21371 Foot drop, right foot: Secondary | ICD-10-CM

## 2018-05-01 DIAGNOSIS — R2689 Other abnormalities of gait and mobility: Secondary | ICD-10-CM | POA: Diagnosis present

## 2018-05-01 DIAGNOSIS — D126 Benign neoplasm of colon, unspecified: Secondary | ICD-10-CM

## 2018-05-01 NOTE — Telephone Encounter (Signed)
Revealed negative genetic testing.  Revealed that a VUS in ATM was identified.   This normal result means we did not find a hereditary cause for his colon polyps. He understands genetic testing is not perfect and cannot completely rule out a hereditary cause.  Given his history he is still at a higher risk to develop polyps and should have more frequent colonocopies/ Upper endoscopies, etc at an interval given by his GI doctors.    We discussed that colon polyps can still run in the family even when we do not find a mutation, therefore he should inform his relatives about his polyps and his relatives should discuss increased colon screening with their doctors.

## 2018-05-01 NOTE — Therapy (Signed)
[facility] [address] [place], [place], [address] Phone: [phone]   Fax:  [phone]  Physical Therapy Treatment  Patient Details  Name: Robert Castro MRN: [id number] Date of Birth: December 03, 1950 Referring Provider: Bing Matter, PA   Encounter Date: 05/01/2018  PT End of Session - 05/01/18 1336    Visit Number  5    Number of Visits  17    Date for PT Re-Evaluation  06/03/18    Authorization Type  medicare and AARP    PT Start Time  1336   pt arrived late; spent time calling his MD   PT Stop Time  1400    PT Time Calculation (min)  24 min    Activity Tolerance  Patient tolerated treatment well    Behavior During Therapy  [facility] for tasks assessed/performed       Past Medical History:  Diagnosis Date  . Adenomatous duodenal polyp   . Arthritis   . Barrett's esophagus   . Cataract    both eyes  . 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 Depression   . Diabetes ([place])   . Diverticulosis   . Fatty liver   . GERD (gastroesophageal reflux disease)   . Hyperlipidemia   . Hypertension   . Hypothyroidism   . Internal hemorrhoids   . Leg edema, left   . Osteoarthritis   . Sleep apnea    cpap  . Testosterone deficiency   . Tremors of nervous system   . Umbilical hernia     Past Surgical History:  Procedure Laterality Date  . BACK SURGERY  2009  . CATARACT EXTRACTION, BILATERAL  12/2016  . ESOPHAGOGASTRODUODENOSCOPY (EGD) WITH PROPOFOL N/A 09/16/2017   Procedure: ESOPHAGOGASTRODUODENOSCOPY (EGD) WITH PROPOFOL;  Surgeon: Yetta Flock, MD;  Location: [facility];  Service: Gastroenterology;  Laterality: N/A;  . LUMBAR LAMINECTOMY/ DECOMPRESSION WITH MET-RX  2014  . TONSILLECTOMY  1956  . UMBILICAL HERNIA REPAIR      There were no vitals filed for this visit.  Subjective Assessment - 05/01/18 1330    Subjective  Did not sleep at all last night as he was  on the phone trying to clear up a $4700 banking error. (Explaining his more impaired balance today). Has not been doing his HEP    Pertinent History  diabetes, h/o drinking, back surgery 2009, 2014    Patient Stated Goals  "the biggest thing is that my walking has been greatly impaired by this.  I can't keep up with everyone."  He reports having to turn sideways to go down stairs.  He also notes a funny feeling in his head.     Currently in Pain?  No/denies        Treatment- Explained attempts to contact his referring provider to obtain an order for an AFO and have not received a response. Pt immediately wanted to call their office to see what could be done. Offered for pt to do this after his PT appt concluded, however he wanted to take time to call during session. Ultimately PT spoke with receptionist and she found PTs fax and reported it was given to the nurse to share with Bing Matter, PA when she is not with a pt.   Donned clinic rt ottobock toe up AFO for gait training including ramp and stairs for pt to be educated on proper technique for negotiating these obstacles with an AFO and cane.  Patient with no instances of rt foot/toe dragging while wearing clinic AFO. Again requires cues (and visual demonstration) for proper sequencing with SPC to improve safety and reduce fall risk. Ambulated total of 300 ft.   Patient reported not doing his HEP. Pulled up handout on computer and reviewed each exercise.                        PT Education - 05/02/18 0535    Education Details  Again reviewed handout of HEP and importance of completing assigned exercises; process for obtaining right AFO; again explained why he does not need a brace for left ankle (no foot-drop, weak plantarflexors)    Person(s) Educated  Patient    Methods  Explanation    Comprehension  Verbalized understanding       PT Short Term Goals - 04/04/18 1015      PT SHORT TERM GOAL #1   Title  The patient  will return demo HEP for right hip abduction strengthening, ankle strengthening and balance near support surface.  (STGs due 05/03/18)    Time  4    Period  Weeks    Target Date  05/03/18      PT SHORT TERM GOAL #2   Title  The patient will improve Berg from 32/56 to > or equal to 38/56 to demo dec'ing risk for falls.    Time  4    Period  Weeks    Target Date  05/03/18      PT SHORT TERM GOAL #3   Title  The patient will ambulate with SPC x 400 ft mod indep without loss of balance.    Baseline  Uses no device and needs supervision to CGA.    Time  4    Period  Weeks    Target Date  05/03/18      PT SHORT TERM GOAL #4   Title  The patient will negotiate 4 steps with one handrail and reciprocal pattern mod indep.    Baseline  The patient needs supervision on steps    Time  4    Period  Weeks    Target Date  05/03/18      PT SHORT TERM GOAL #5   Title  Trial AFOs R LE to determine if this improves functional ambulation.    Time  4    Period  Weeks    Target Date  05/03/18        PT Long Term Goals - 04/04/18 1209      PT LONG TERM GOAL #1   Title  The patient will be indep with progression of HEP.    Time  8    Period  Weeks    Target Date  06/02/18      PT LONG TERM GOAL #2   Title  The patient will improve Berg balance scale from 32/56 to > or equal to 42/56 to demo dec'ing risk for falls.    Time  8    Period  Weeks    Target Date  06/02/18      PT LONG TERM GOAL #3   Title  The patinet will improve gait speed from 2.29 ft/sec to > or equal to 2.62 ft/sec to demo transition to full community ambulator classification of gait.    Time  8    Period  Weeks    Target Date  06/02/18      PT LONG TERM GOAL #  4   Title  The patient will negotiate community surfaces including curbs, inclines, and paved surfaces x 1000 ft with SPC mod indep.    Time  8    Period  Weeks    Target Date  06/02/18            Plan - 05/02/18 0538    Clinical Impression Statement   Session short due to pt's late arrival and then insistence on calling his MD during therapy session (re: getting order for AFO). Patient appeared groggy, balance/gait more impaired and continues to repeat questions he has asked and had answered numerous times previously. He admits he is not doing his HEP. Due to shortened session and pt's request to review his HEP, unable to assess STGs (due 9/7). Will assess next visit and discuss discharge if he is not progressing and not doing HEP.     Rehab Potential  Good    PT Frequency  2x / week   eval +   PT Duration  8 weeks    PT Treatment/Interventions  ADLs/Self Care Home Management;Therapeutic exercise;Balance training;Neuromuscular re-education;Gait training;Stair training;Functional mobility training;Therapeutic activities;Orthotic Fit/Training;Patient/family education;DME Instruction;Vestibular;Manual techniques    PT Next Visit Plan  chk STGs; discuss d/c if not doing HEP; follow-up to see if MD/NP has ordered AFO; work on gait with cane and rt ottobock AFO with medial strut (he has slight inversion tendency) ramp, curb, steps, unlevel; ? add corner exercises to HEP with eyes open/eyes closed, narrowing base of support. Work on right hip and bil ankle strengthening     Consulted and Agree with Plan of Care  Patient       Patient will benefit from skilled therapeutic intervention in order to improve the following deficits and impairments:  Abnormal gait, Impaired sensation, Decreased balance, Decreased mobility, Difficulty walking, Decreased safety awareness, Postural dysfunction, Pain, Decreased strength  Visit Diagnosis: Other abnormalities of gait and mobility  Muscle weakness (generalized)  Foot drop, right  Unsteadiness on feet     Problem List Patient Active Problem List   Diagnosis Date Noted  . Genetic testing 05/01/2018  . Adenomatous duodenal polyp   . Diarrhea 05/05/2015  . GERD 12/01/2007  . DYSPHAGIA UNSPECIFIED  12/01/2007  . INTERNAL HEMORRHOIDS 03/06/2007  . BARRETTS ESOPHAGUS 01/29/2007  . DIVERTICULOSIS, COLON 08/01/2005  . COLONIC POLYPS, ADENOMATOUS 07/09/2002    Rexanne Mano, PT 05/02/2018, 5:44 AM  [facility] [address] [place], [place], [address] Phone: [phone]   Fax:  [phone]  Name: Robert Castro MRN: [id number] Date of Birth: 20-Apr-1951

## 2018-05-01 NOTE — Progress Notes (Signed)
HPI:  Mr. Laster was previously seen in the [place] clinic on 04/22/2018 due to a personal history of colon polyps, and concerns regarding a hereditary predisposition to cancer. Please refer to our prior cancer genetics clinic note for more information regarding Mr. Breton's medical, social and family histories, and our assessment and recommendations, at the time. Mr. Quinby recent genetic test results were disclosed to him, as well as recommendations warranted by these results. These results and recommendations are discussed in more detail below.  CANCER HISTORY:  Mr. 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  He has multiple polyps in the gastric antrum that were hyperplastic.     FAMILY HISTORY:  We obtained a detailed, 4-generation family history.  Significant diagnoses are listed below: Family History  Problem Relation Age of Onset  . Atrial fibrillation Brother   . Cancer Mother 27       abdominal cancer, unk name of cancer type  . Heart attack Maternal Grandfather   . Stroke Maternal Grandfather   . Heart attack Paternal Grandfather   . Colon cancer Neg Hx   . Stomach cancer Neg Hx   . Colon polyps Neg Hx   . Esophageal cancer Neg Hx   . Rectal cancer Neg Hx    Mr. Steck has no children.  He has a brother who is 57 with no history of cancer. He has 2 children with no history of cancer.   Mr. Currier father: died at 73 with no history of cancer, unk if he ever had colon polyps or not   Paternal Aunts/Uncles: none- father was only child Paternal cousins: N/A Paternal grandfather: died of a heart attack Paternal grandmother:no history of cancer  Mr. Kirtley's mother: died at 12 due to an abdominal cancer.  Mr. Rayl could not recall the name of the type of cancer she had- he reports they had to send her cancer to [facility] for dx it was an usual type of cancer.  Maternal Aunts/Uncles: 2  maternal aunts with no known history of cancer/polyps  Maternal cousins: no known history of cancer/polyps.  Maternal grandfather: died of heart attack/stroke Maternal grandmother: no known history of cancer  Mr. Umland is unaware of previous family history of genetic testing for hereditary cancer risks. Patient's maternal ancestors are of Caucasian descent, and paternal ancestors are of Caucasian descent. There is no reported Ashkenazi Jewish ancestry. There I sno known consanguinity.  GENETIC TEST RESULTS: Genetic testing performed through [facility]'s Multi-Cancer Panel reported out on 05/01/2018 showed no pathogenic mutations.  The Multi-Cancer Panel offered by [facility] includes sequencing and/or deletion duplication testing of the following 91 genes: AIP, ALK, APC, ATM, AXIN2, BAP1, BARD1, BLM, BMPR1A, BRCA1, BRCA2, BRIP1, BUB1B, CASR, CDC73, CDH1, CDK4, CDKN1B, CDKN1C, CDKN2A, CEBPA, CEP57, CHEK2, CTNNA1, DICER1, DIS3L2, EGFR, ENG, EPCAM, FH, FLCN, GALNT12, GATA2, GPC3, GREM1, HOXB13, HRAS, KIT, MAX, MEN1, MET, MITF, MLH1, MLH3, MSH2, MSH3, MSH6, MUTYH, NBN, NF1, NF2, NTHL1, PALB2, PDGFRA, PHOX2B, PMS2, POLD1, POLE, POT1, PRKAR1A, PTCH1, PTEN, RAD50, RAD51C, RAD51D, RB1, RECQL4, RET, RNF43, RPS20, RUNX1, SDHA, SDHAF2, SDHB, SDHC, SDHD, SMAD4, SMARCA4, SMARCB1, SMARCE1, STK11, SUFU, TERC, TERT, TMEM127, TP53, TSC1, TSC2, VHL, WRN, WT1  A variant of uncertain significance (VUS) in a gene called ATM was also noted. [id number] ([id number])  The test report will be scanned into EPIC and will be located under the Molecular Pathology section of the Results Review tab. A portion of  the result report is included below for reference.     We discussed with Mr. Kruzel that because current genetic testing is not perfect, it is possible there may be a gene mutation in one of these genes that current testing cannot detect, but that chance is small.  We also discussed, that there could be another gene that has not  yet been discovered, or that we have not yet tested, that is responsible for the cancer diagnoses in the family. It is also possible there is a hereditary cause for the cancer in the family that Mr. Dave did not inherit and therefore was not identified in his testing.  Therefore, it is important to remain in touch with cancer genetics in the future so that we can continue to offer Mr. Trull the most up to date genetic testing.   Regarding the VUS in ATM: At this time, it is unknown if this variant is associated with increased cancer risk or if this is a normal finding, but most variants such as this get reclassified to being inconsequential. It should not be used to make medical management decisions. With time, we suspect the lab will determine the significance of this variant, if any. If we do learn more about it, we will try to contact Mr. Tangeman to discuss it further. However, it is important to stay in touch with [place] periodically and keep the address and phone number up to date.  ADDITIONAL GENETIC TESTING: We discussed with Mr. Shatzer that his genetic testing was fairly extensive.  If there are are genes identified to increase cancer risk that can be analyzed in the future, we would be happy to discuss and coordinate this testing at that time.    CANCER SCREENING RECOMMENDATIONS: This negative result means that we were unable to identify a hereditary cause for his history of colon polyps at this time.  This result does not rule out a hereditary cause for his  cancer.  It is still possible that there could be genetic mutations that are undetectable by current technology, or genetic mutations in genes that have not been tested or identified to increase cancer risk.  Given his history of colon and duodenal polyps, it should be assumed he is at high risk to develop more GI polyps and should be screened approprately.  Therefore, it is recommended he continue to follow the cancer management and screening  guidelines provided by his GI and primary healthcare providers. An individual's cancer risk is not determined by genetic test results alone.  Overall cancer risk assessment includes additional factors such as personal medical history, family history, etc.  These should be used to make a personalized plan for cancer prevention and surveillance.     RECOMMENDATIONS FOR FAMILY MEMBERS:  Relatives in this family might be at some increased risk of developing cancer, over the general population risk, simply due to the family history of cancer.  We recommended women in this family have a yearly mammogram beginning at age 58, or 23 years younger than the earliest onset of cancer, an annual clinical breast exam, and perform monthly breast self-exams. Women in this family should also have a gynecological exam as recommended by their primary provider. All family members should have a colonoscopy by age 19 (or as directed by their doctors).  All family members should inform their physicians about the family history of cancer/polyps so their doctors can make the most appropriate screening recommendations for them.   For first degree relatives of a  person with colonoic adenomatous polyposis if unknown Etiology >20-<100 adenomas:  Colonoscopy should be considered every 3-5 years starting at age 44 (or age when polyposis dx in relative) See NCCN Genetic/Familial High-Risk Assessment: Colorectaol guidelines for further recommendations/commentary.   FOLLOW-UP: Lastly, we discussed with Mr. Domzalski that cancer genetics is a rapidly advancing field and it is possible that new genetic tests will be appropriate for him and/or his family members in the future. We encouraged him to remain in contact with cancer genetics on an annual basis so we can update his personal and family histories and let him know of advances in cancer genetics that may benefit this family.   Our contact number was provided. Mr. Netz questions were  answered to his satisfaction, and he knows he is welcome to call us at anytime with additional questions or concerns.   Ferol Luz, MS, [facility] Certified Genetic Counselor [email or web address]

## 2018-05-02 ENCOUNTER — Encounter

## 2018-05-05 ENCOUNTER — Ambulatory Visit: Payer: Medicare Other | Admitting: Physical Therapy

## 2018-05-08 ENCOUNTER — Ambulatory Visit: Payer: Medicare Other | Admitting: Rehabilitative and Restorative Service Providers"

## 2018-05-08 ENCOUNTER — Encounter: Payer: Self-pay | Admitting: Rehabilitative and Restorative Service Providers"

## 2018-05-08 DIAGNOSIS — M6281 Muscle weakness (generalized): Secondary | ICD-10-CM

## 2018-05-08 DIAGNOSIS — R2689 Other abnormalities of gait and mobility: Secondary | ICD-10-CM | POA: Diagnosis not present

## 2018-05-08 DIAGNOSIS — R2681 Unsteadiness on feet: Secondary | ICD-10-CM

## 2018-05-08 DIAGNOSIS — R296 Repeated falls: Secondary | ICD-10-CM

## 2018-05-08 DIAGNOSIS — M21371 Foot drop, right foot: Secondary | ICD-10-CM

## 2018-05-08 NOTE — Therapy (Signed)
[facility] [address] [place], [place], [address] Phone: [phone]   Fax:  [phone]  Physical Therapy Treatment  Patient Details  Name: Robert Castro MRN: [id number] Date of Birth: 04-14-1951 Referring Provider: Bing Matter, PA   Encounter Date: 05/08/2018  PT End of Session - 05/08/18 2112    Visit Number  6    Number of Visits  17    Date for PT Re-Evaluation  06/03/18    Authorization Type  medicare and AARP    PT Start Time  1404    PT Stop Time  1448    PT Time Calculation (min)  44 min    Equipment Utilized During Treatment  Gait belt    Activity Tolerance  Patient tolerated treatment well    Behavior During Therapy  [facility] for tasks assessed/performed       Past Medical History:  Diagnosis Date  . Adenomatous duodenal polyp   . Arthritis   . Barrett's esophagus   . Cataract    both eyes  . 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 Depression   . Diabetes ([place])   . Diverticulosis   . Fatty liver   . GERD (gastroesophageal reflux disease)   . Hyperlipidemia   . Hypertension   . Hypothyroidism   . Internal hemorrhoids   . Leg edema, left   . Osteoarthritis   . Sleep apnea    cpap  . Testosterone deficiency   . Tremors of nervous system   . Umbilical hernia     Past Surgical History:  Procedure Laterality Date  . BACK SURGERY  2009  . CATARACT EXTRACTION, BILATERAL  12/2016  . ESOPHAGOGASTRODUODENOSCOPY (EGD) WITH PROPOFOL N/A 09/16/2017   Procedure: ESOPHAGOGASTRODUODENOSCOPY (EGD) WITH PROPOFOL;  Surgeon: Yetta Flock, MD;  Location: [facility];  Service: Gastroenterology;  Laterality: N/A;  . LUMBAR LAMINECTOMY/ DECOMPRESSION WITH MET-RX  2014  . TONSILLECTOMY  1956  . UMBILICAL HERNIA REPAIR      There were no vitals filed for this visit.  Subjective Assessment - 05/08/18 1408    Subjective  The patient has not had any falls in  the past week.  He did not remember trying a brace in therapy last week.      Pertinent History  diabetes, h/o drinking, back surgery 2009, 2014    Patient Stated Goals  "the biggest thing is that my walking has been greatly impaired by this.  I can't keep up with everyone."  He reports having to turn sideways to go down stairs.  He also notes a funny feeling in his head.     Currently in Pain?  Yes    Pain Score  --   "not too bad"   Pain Location  Back    Pain Orientation  Right    Pain Descriptors / Indicators  Aching    Pain Type  Acute pain    Pain Onset  More than a month ago    Pain Frequency  Intermittent    Aggravating Factors   hurts "from time to time"         [facility] PT Assessment - 05/08/18 1418      Ambulation/Gait   Ambulation/Gait  Yes    Ambulation/Gait Assistance  6: Modified independent (Device/Increase time)    Ambulation/Gait Assistance Details  With R AFO donned ([place] with medial posting)    Ambulation Distance (Feet)  460 Feet  Assistive device  Straight cane    Gait Pattern  Trendelenburg;Decreased stance time - right;Decreased step length - left;Decreased stride length;Lateral hip instability;Lateral trunk lean to left;Poor foot clearance - right;Decreased weight shift to right;Decreased dorsiflexion - right    Gait velocity  2.20 ft/sec    Stairs  Yes    Stairs Assistance  6: Modified independent (Device/Increase time)    Stair Management Technique  One rail Right;Alternating pattern    Number of Stairs  4      Standardized Balance Assessment   Standardized Balance Assessment  Berg Balance Test      Berg Balance Test   Sit to Stand  Able to stand without using hands and stabilize independently    Standing Unsupported  Able to stand safely 2 minutes    Sitting with Back Unsupported but Feet Supported on Floor or Stool  Able to sit safely and securely 2 minutes    Stand to Sit  Sits safely with minimal use of hands    Transfers  Able to transfer  safely, minor use of hands    Standing Unsupported with Eyes Closed  Able to stand 10 seconds safely    Standing Ubsupported with Feet Together  Able to place feet together independently and stand 1 minute safely    From Standing, Reach Forward with Outstretched Arm  Can reach confidently >25 cm (10")    From Standing Position, Pick up Object from Floor  Able to pick up shoe safely and easily    From Standing Position, Turn to Look Behind Over each Shoulder  Turn sideways only but maintains balance    Turn 360 Degrees  Able to turn 360 degrees safely but slowly    Standing Unsupported, Alternately Place Feet on Step/Stool  Able to complete 4 steps without aid or supervision    Standing Unsupported, One Foot in Front  Able to take small step independently and hold 30 seconds    Standing on One Leg  Tries to lift leg/unable to hold 3 seconds but remains standing independently    Total Score  45    Berg comment:  45/56                   [facility] Adult PT Treatment/Exercise - 05/08/18 1418      Exercises   Exercises  Other Exercises    Other Exercises   Reviewed prior HEP including clam shells, ankle DF, standing hip abduction, standing heel raises.               PT Short Term Goals - 05/08/18 1417      PT SHORT TERM GOAL #1   Title  The patient will return demo HEP for right hip abduction strengthening, ankle strengthening and balance near support surface.  (STGs due 05/03/18)    Baseline  Patient has been provided HEP, but is not participating with program.    Time  4    Period  Weeks    Status  Partially Met      PT SHORT TERM GOAL #2   Title  The patient will improve Berg from 32/56 to > or equal to 38/56 to demo dec'ing risk for falls.    Baseline  Berg=45/56    Time  4    Period  Weeks    Status  Achieved      PT SHORT TERM GOAL #3   Title  The patient will ambulate with SPC x 400 ft mod indep without  loss of balance.    Baseline  Uses no device and needs  supervision to CGA. (at eval)/ with SPC and R AFO, PT provides supervision.     Time  4    Period  Weeks    Status  Achieved      PT SHORT TERM GOAL #4   Title  The patient will negotiate 4 steps with one handrail and reciprocal pattern mod indep.    Baseline  The patient needs supervision on steps    Time  4    Period  Weeks    Status  Achieved      PT SHORT TERM GOAL #5   Title  Trial AFOs R LE to determine if this improves functional ambulation.    Time  4    Period  Weeks    Status  Achieved        PT Long Term Goals - 04/04/18 1209      PT LONG TERM GOAL #1   Title  The patient will be indep with progression of HEP.    Time  8    Period  Weeks    Target Date  06/02/18      PT LONG TERM GOAL #2   Title  The patient will improve Berg balance scale from 32/56 to > or equal to 42/56 to demo dec'ing risk for falls.    Time  8    Period  Weeks    Target Date  06/02/18      PT LONG TERM GOAL #3   Title  The patinet will improve gait speed from 2.29 ft/sec to > or equal to 2.62 ft/sec to demo transition to full community ambulator classification of gait.    Time  8    Period  Weeks    Target Date  06/02/18      PT LONG TERM GOAL #4   Title  The patient will negotiate community surfaces including curbs, inclines, and paved surfaces x 1000 ft with SPC mod indep.    Time  8    Period  Weeks    Target Date  06/02/18            Plan - 05/08/18 2125    Clinical Impression Statement  The patient has met 4 STGs and partially met 1 STG.  He has improved right foot clearance with use of AFO and order was received today-- PT to set up AFO consult.  PT to continue to work towards [place] emphasizing compliance with home program.     PT Treatment/Interventions  ADLs/Self Care Home Management;Therapeutic exercise;Balance training;Neuromuscular re-education;Gait training;Stair training;Functional mobility training;Therapeutic activities;Orthotic Fit/Training;Patient/family  education;DME Instruction;Vestibular;Manual techniques    PT Next Visit Plan  discuss d/c if not doing HEP; work on gait with cane and rt [place] AFO with medial strut (he has slight inversion tendency) ramp, curb, steps, unlevel; ? add corner exercises to HEP with eyes open/eyes closed, narrowing base of support. Work on right hip and bil ankle strengthening; posture, postural strengthening.    Consulted and Agree with Plan of Care  Patient       Patient will benefit from skilled therapeutic intervention in order to improve the following deficits and impairments:  Abnormal gait, Impaired sensation, Decreased balance, Decreased mobility, Difficulty walking, Decreased safety awareness, Postural dysfunction, Pain, Decreased strength  Visit Diagnosis: Other abnormalities of gait and mobility  Muscle weakness (generalized)  Foot drop, right  Unsteadiness on feet  Repeated falls  Problem List Patient Active Problem List   Diagnosis Date Noted  . Genetic testing 05/01/2018  . Adenomatous duodenal polyp   . Diarrhea 05/05/2015  . GERD 12/01/2007  . DYSPHAGIA UNSPECIFIED 12/01/2007  . INTERNAL HEMORRHOIDS 03/06/2007  . BARRETTS ESOPHAGUS 01/29/2007  . DIVERTICULOSIS, COLON 08/01/2005  . COLONIC POLYPS, ADENOMATOUS 07/09/2002    Nida Manfredi , PT 05/08/2018, 9:39 PM  [facility] [address] [place], [place], [address] Phone: [phone]   Fax:  [phone]  Name: BOLESLAW BORGHI MRN: [id number] Date of Birth: 1951/06/27

## 2018-05-12 ENCOUNTER — Ambulatory Visit: Payer: Medicare Other | Admitting: Rehabilitative and Restorative Service Providers"

## 2018-05-15 ENCOUNTER — Ambulatory Visit: Payer: Medicare Other | Admitting: Rehabilitative and Restorative Service Providers"

## 2018-05-19 ENCOUNTER — Ambulatory Visit: Payer: Medicare Other | Admitting: Rehabilitative and Restorative Service Providers"

## 2018-05-19 ENCOUNTER — Encounter: Payer: Self-pay | Admitting: Rehabilitative and Restorative Service Providers"

## 2018-05-19 DIAGNOSIS — R2689 Other abnormalities of gait and mobility: Secondary | ICD-10-CM | POA: Diagnosis not present

## 2018-05-19 DIAGNOSIS — M21371 Foot drop, right foot: Secondary | ICD-10-CM

## 2018-05-19 DIAGNOSIS — M6281 Muscle weakness (generalized): Secondary | ICD-10-CM

## 2018-05-19 DIAGNOSIS — R296 Repeated falls: Secondary | ICD-10-CM

## 2018-05-19 DIAGNOSIS — R2681 Unsteadiness on feet: Secondary | ICD-10-CM

## 2018-05-19 NOTE — Therapy (Signed)
[facility] [address] [place], [place], [address] Phone: [phone]   Fax:  [phone]  Physical Therapy Treatment  Patient Details  Name: Robert Castro MRN: [id number] Date of Birth: 17-Jun-1951 Referring Provider: Bing Matter, PA   Encounter Date: 05/19/2018  PT End of Session - 05/19/18 1409    Visit Number  7    Number of Visits  17    Date for PT Re-Evaluation  06/03/18    Authorization Type  medicare and AARP    PT Start Time  1405    PT Stop Time  1445    PT Time Calculation (min)  40 min    Equipment Utilized During Treatment  Gait belt    Activity Tolerance  Patient tolerated treatment well    Behavior During Therapy  [facility] for tasks assessed/performed       Past Medical History:  Diagnosis Date  . Adenomatous duodenal polyp   . Arthritis   . Barrett's esophagus   . Cataract    both eyes  . 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 Depression   . Diabetes ([place])   . Diverticulosis   . Fatty liver   . GERD (gastroesophageal reflux disease)   . Hyperlipidemia   . Hypertension   . Hypothyroidism   . Internal hemorrhoids   . Leg edema, left   . Osteoarthritis   . Sleep apnea    cpap  . Testosterone deficiency   . Tremors of nervous system   . Umbilical hernia     Past Surgical History:  Procedure Laterality Date  . BACK SURGERY  2009  . CATARACT EXTRACTION, BILATERAL  12/2016  . ESOPHAGOGASTRODUODENOSCOPY (EGD) WITH PROPOFOL N/A 09/16/2017   Procedure: ESOPHAGOGASTRODUODENOSCOPY (EGD) WITH PROPOFOL;  Surgeon: Yetta Flock, MD;  Location: [facility];  Service: Gastroenterology;  Laterality: N/A;  . LUMBAR LAMINECTOMY/ DECOMPRESSION WITH MET-RX  2014  . TONSILLECTOMY  1956  . UMBILICAL HERNIA REPAIR      There were no vitals filed for this visit.  Subjective Assessment - 05/19/18 1513    Subjective  The patient notes he has not heard  from [place].  He is not performing HEP regularly noting he tires easily.    Pertinent History  diabetes, h/o drinking, back surgery 2009, 2014    Patient Stated Goals  "the biggest thing is that my walking has been greatly impaired by this.  I can't keep up with everyone."  He reports having to turn sideways to go down stairs.  He also notes a funny feeling in his head.     Currently in Pain?  Yes    Pain Score  --   mild   Pain Location  Back    Pain Orientation  Right    Pain Descriptors / Indicators  Aching    Pain Onset  More than a month ago    Pain Frequency  Intermittent    Aggravating Factors   intermittent pain    Pain Relieving Factors  rest                       [facility] Adult PT Treatment/Exercise - 05/19/18 1453      Ambulation/Gait   Ambulation/Gait  Yes    Ambulation/Gait Assistance  5: Supervision;6: Modified independent (Device/Increase time)    Ambulation/Gait Assistance Details  Patient ambulated into clinic today with some instability without AFO using  SPC.  PT had patient donn AFO (ottobock R) x 300 ft with mod indep with SPC    Assistive device  Straight cane    Gait Pattern  Trendelenburg;Decreased stance time - right;Decreased step length - left;Decreased stride length;Lateral hip instability;Lateral trunk lean to left;Poor foot clearance - right;Decreased weight shift to right;Decreased dorsiflexion - right      Neuro Re-ed    Neuro Re-ed Details   Sidestepping dec'ing UE support emphasizing dec'd compensatory trunk leaning.  Marching working on R hip stability with tactile cues.       Exercises   Exercises  Knee/Hip;Lumbar      Lumbar Exercises: Supine   Other Supine Lumbar Exercises  hip hike/hip depression x 5 reps each side; isometric hip extension R and L x 5 reps, supine scapular retraction x 5 reps.      Knee/Hip Exercises: Standing   Hip Flexion  Right;Left;10 reps    Hip Flexion Limitations  cues to decrease lateral lean    Hip  Abduction  Stengthening;Right;Left;10 reps;3 sets    Abduction Limitations  with red theraband     Hip Extension  Stengthening;Right;Left;5 reps    Extension Limitations  with red theraband working on stablity with stepping.                PT Short Term Goals - 05/08/18 1417      PT SHORT TERM GOAL #1   Title  The patient will return demo HEP for right hip abduction strengthening, ankle strengthening and balance near support surface.  (STGs due 05/03/18)    Baseline  Patient has been provided HEP, but is not participating with program.    Time  4    Period  Weeks    Status  Partially Met      PT SHORT TERM GOAL #2   Title  The patient will improve Berg from 32/56 to > or equal to 38/56 to demo dec'ing risk for falls.    Baseline  Berg=45/56    Time  4    Period  Weeks    Status  Achieved      PT SHORT TERM GOAL #3   Title  The patient will ambulate with SPC x 400 ft mod indep without loss of balance.    Baseline  Uses no device and needs supervision to CGA. (at eval)/ with SPC and R AFO, PT provides supervision.     Time  4    Period  Weeks    Status  Achieved      PT SHORT TERM GOAL #4   Title  The patient will negotiate 4 steps with one handrail and reciprocal pattern mod indep.    Baseline  The patient needs supervision on steps    Time  4    Period  Weeks    Status  Achieved      PT SHORT TERM GOAL #5   Title  Trial AFOs R LE to determine if this improves functional ambulation.    Time  4    Period  Weeks    Status  Achieved        PT Long Term Goals - 04/04/18 1209      PT LONG TERM GOAL #1   Title  The patient will be indep with progression of HEP.    Time  8    Period  Weeks    Target Date  06/02/18      PT LONG TERM GOAL #2  Title  The patient will improve Berg balance scale from 32/56 to > or equal to 42/56 to demo dec'ing risk for falls.    Time  8    Period  Weeks    Target Date  06/02/18      PT LONG TERM GOAL #3   Title  The patinet  will improve gait speed from 2.29 ft/sec to > or equal to 2.62 ft/sec to demo transition to full community ambulator classification of gait.    Time  8    Period  Weeks    Target Date  06/02/18      PT LONG TERM GOAL #4   Title  The patient will negotiate community surfaces including curbs, inclines, and paved surfaces x 1000 ft with SPC mod indep.    Time  8    Period  Weeks    Target Date  06/02/18            Plan - 05/19/18 2045    Clinical Impression Statement  The patient c/o discomfort in low back and lateral rib region.  He feels relief with supine ther exercises.  PT recommending R AFO- awaiting further notes from MD to be sent to [facility] to puruse brace.  PT continuing to encourage greater compliance for HEP to maintain mobility and reduce falls.     PT Treatment/Interventions  ADLs/Self Care Home Management;Therapeutic exercise;Balance training;Neuromuscular re-education;Gait training;Stair training;Functional mobility training;Therapeutic activities;Orthotic Fit/Training;Patient/family education;DME Instruction;Vestibular;Manual techniques    PT Next Visit Plan  supine decompression exercises; work on gait with cane and rt ottobock AFO with medial strut (he has slight inversion tendency) ramp, curb, steps, unlevel; ? add corner exercises to HEP with eyes open/eyes closed, narrowing base of support. Work on right hip and bil ankle strengthening; posture, postural strengthening.    Consulted and Agree with Plan of Care  Patient       Patient will benefit from skilled therapeutic intervention in order to improve the following deficits and impairments:  Abnormal gait, Impaired sensation, Decreased balance, Decreased mobility, Difficulty walking, Decreased safety awareness, Postural dysfunction, Pain, Decreased strength  Visit Diagnosis: Other abnormalities of gait and mobility  Muscle weakness (generalized)  Foot drop, right  Unsteadiness on feet  Repeated  falls     Problem List Patient Active Problem List   Diagnosis Date Noted  . Genetic testing 05/01/2018  . Adenomatous duodenal polyp   . Diarrhea 05/05/2015  . GERD 12/01/2007  . DYSPHAGIA UNSPECIFIED 12/01/2007  . INTERNAL HEMORRHOIDS 03/06/2007  . BARRETTS ESOPHAGUS 01/29/2007  . DIVERTICULOSIS, COLON 08/01/2005  . COLONIC POLYPS, ADENOMATOUS 07/09/2002    Lon Klippel, PT 05/19/2018, 8:54 PM  [facility] [address] [place], [place], [address] Phone: [phone]   Fax:  [phone]  Name: PERSHING SKIDMORE MRN: [id number] Date of Birth: 1951/02/24

## 2018-05-22 ENCOUNTER — Ambulatory Visit: Payer: Medicare Other

## 2018-05-26 ENCOUNTER — Ambulatory Visit: Payer: Medicare Other | Admitting: Rehabilitative and Restorative Service Providers"

## 2018-05-29 ENCOUNTER — Ambulatory Visit: Payer: Medicare Other | Attending: Family Medicine

## 2018-05-29 DIAGNOSIS — M6281 Muscle weakness (generalized): Secondary | ICD-10-CM | POA: Diagnosis present

## 2018-05-29 DIAGNOSIS — R2689 Other abnormalities of gait and mobility: Secondary | ICD-10-CM | POA: Diagnosis present

## 2018-05-29 DIAGNOSIS — M21371 Foot drop, right foot: Secondary | ICD-10-CM | POA: Diagnosis present

## 2018-05-29 DIAGNOSIS — R296 Repeated falls: Secondary | ICD-10-CM | POA: Insufficient documentation

## 2018-05-29 DIAGNOSIS — R2681 Unsteadiness on feet: Secondary | ICD-10-CM | POA: Diagnosis present

## 2018-05-29 NOTE — Patient Instructions (Addendum)
Perform in a corner with chair in front of you OR at kitchen sink with chair behind you for safety:  Feet Apart (Compliant Surface) Varied Arm Positions - Eyes Closed    Stand on compliant surface: __pillow/cushions______ with feet shoulder width apart and arms at your side. Close eyes and visualize upright position. Hold__10-30__ seconds. Repeat __3__ times per session. Do __1__ sessions per day.  Copyright  VHI. All rights reserved.  Feet Apart (Compliant Surface) Head Motion - Eyes Open    With eyes open, standing on compliant surface: __pillow/cushions______, feet shoulder width apart, move head slowly: up and down 5 times and side to side 5 times. Repeat __3__ times per session. Do __1__ sessions per day.  Copyright  VHI. All rights reserved.  Feet Together (Compliant Surface) Varied Arm Positions - Eyes Open    With eyes open, standing on compliant surface: __pillow/cushions______, feet together and arms at your side, look at a stationary object. Hold __30__ seconds. Repeat _3___ times per session. Do __1__ sessions per day.  Copyright  VHI. All rights reserved.    Access Code: [email or web address]  URL: [email or web address]  Date: 05/29/2018  Prepared by: Geoffry Paradise   Exercises  Clamshell with Resistance - 10 reps - 2 sets - 3 sec hold - 1x daily - 5x weekly  Standing Hip Abduction with Counter Support - 20 reps - 1 sets - 3 seconds hold - 3x daily - 5x weekly  Heel Toe Raises with Counter Support - 10 reps - 1 sets - 3x daily - 5x weekly  Seated Toe Raise - 30 reps - 1 sets - 1-3x daily - 5x weekly

## 2018-05-29 NOTE — Therapy (Signed)
[facility] [address] [place], [place], [address] Phone: [phone]   Fax:  [phone]  Physical Therapy Treatment  Patient Details  Name: Robert Castro MRN: [id number] Date of Birth: 01/26/1951 Referring Provider (PT): Kristen Kaplan, PA   Encounter Date: 05/29/2018  PT End of Session - 05/29/18 1638    Visit Number  8    Number of Visits  17    Date for PT Re-Evaluation  06/03/18    Authorization Type  medicare and AARP    PT Start Time  1539   pt late   PT Stop Time  1618    PT Time Calculation (min)  39 min    Equipment Utilized During Treatment  --   min guard to S prn   Activity Tolerance  Patient tolerated treatment well    Behavior During Therapy  WFL for tasks assessed/performed       Past Medical History:  Diagnosis Date  . Adenomatous duodenal polyp   . Arthritis   . Barrett's esophagus   . Cataract    both eyes  . 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 Depression   . Diabetes ([facility])   . Diverticulosis   . Fatty liver   . GERD (gastroesophageal reflux disease)   . Hyperlipidemia   . Hypertension   . Hypothyroidism   . Internal hemorrhoids   . Leg edema, left   . Osteoarthritis   . Sleep apnea    cpap  . Testosterone deficiency   . Tremors of nervous system   . Umbilical hernia     Past Surgical History:  Procedure Laterality Date  . BACK SURGERY  2009  . CATARACT EXTRACTION, BILATERAL  12/2016  . ESOPHAGOGASTRODUODENOSCOPY (EGD) WITH PROPOFOL N/A 09/16/2017   Procedure: ESOPHAGOGASTRODUODENOSCOPY (EGD) WITH PROPOFOL;  Surgeon: Armbruster, Steven P, MD;  Location: [facility];  Service: Gastroenterology;  Laterality: N/A;  . LUMBAR LAMINECTOMY/ DECOMPRESSION WITH MET-RX  2014  . TONSILLECTOMY  1956  . UMBILICAL HERNIA REPAIR      There were no vitals filed for this visit.  Subjective Assessment - 05/29/18 1542    Subjective  Pt  denied falls or changes since last visit. Pt stated he's trying to perform HEP more. Pt uses SPC for community amb. Pt reported orthotic people (not sure of location) called to set up appt for AFO.     Pertinent History  diabetes, h/o drinking, back surgery 2009, 2014    Patient Stated Goals  "the biggest thing is that my walking has been greatly impaired by this.  I can't keep up with everyone."  He reports having to turn sideways to go down stairs.  He also notes a funny feeling in his head.             Neuro re-ed: Access Code: [email or web address]  URL: [email or web address]  Date: 05/29/2018  Prepared by: Jennifer Miller   Reviewed 2-5 reps Exercises  Clamshell with Resistance - 10 reps - 2 sets - 3 sec hold - 1x daily - 5x weekly  Standing Hip Abduction with Counter Support - 20 reps - 1 sets - 3 seconds hold - 3x daily - 5x weekly  Heel Toe Raises with Counter Support - 10 reps - 1 sets - 3x daily - 5x weekly  Seated Toe Raise - 30 reps - 1 sets - 1-3x daily - 5x weekly               [facility] Adult PT Treatment/Exercise - 05/29/18 1634      High Level Balance   High Level Balance Activities  Head turns;Tandem walking;Other (comment)   forward amb. with eyes closed.   High Level Balance Comments  Pt performed in // bars with 0-1 UE support and min guard to S for safety. Each activity performed 4-6 times. Cues to improve anterior weight shifting during tandem, cues to improve R heel strike and to stay midline vs. lateral trunk lean and trendelenberg.          Balance Exercises - 05/29/18 1635      Balance Exercises: Standing   Standing Eyes Opened  Narrow base of support (BOS);Wide (BOA);Head turns;Foam/compliant surface;3 reps;10 secs;30 secs    Standing Eyes Closed  Wide (BOA);Foam/compliant surface;2 reps;10 secs;20 secs    Other Standing Exercises  Performed in corner with chair in front of pt and min guard to S for safety. Cues and demo for technique with  incr. postural sway noted with eyes closed and head turns. Please see pt instructions for HEP details.         PT Education - 05/29/18 1637    Education Details  Pt asked to review previous HEP and PT provided cues and needed, especially during clamshells. PT provided pt with corner balance HEP. PT explained why the exercises are important and that pt's peripheral neuropathy requires pt to use visual and vestibular systems for balance.     Person(s) Educated  Patient    Methods  Explanation    Comprehension  Verbalized understanding       PT Short Term Goals - 05/08/18 1417      PT SHORT TERM GOAL #1   Title  The patient will return demo HEP for right hip abduction strengthening, ankle strengthening and balance near support surface.  (STGs due 05/03/18)    Baseline  Patient has been provided HEP, but is not participating with program.    Time  4    Period  Weeks    Status  Partially Met      PT SHORT TERM GOAL #2   Title  The patient will improve Berg from 32/56 to > or equal to 38/56 to demo dec'ing risk for falls.    Baseline  Berg=45/56    Time  4    Period  Weeks    Status  Achieved      PT SHORT TERM GOAL #3   Title  The patient will ambulate with SPC x 400 ft mod indep without loss of balance.    Baseline  Uses no device and needs supervision to CGA. (at eval)/ with SPC and R AFO, PT provides supervision.     Time  4    Period  Weeks    Status  Achieved      PT SHORT TERM GOAL #4   Title  The patient will negotiate 4 steps with one handrail and reciprocal pattern mod indep.    Baseline  The patient needs supervision on steps    Time  4    Period  Weeks    Status  Achieved      PT SHORT TERM GOAL #5   Title  Trial AFOs R LE to determine if this improves functional ambulation.    Time  4    Period  Weeks    Status  Achieved        PT Long Term Goals - 04/04/18 1209      PT  LONG TERM GOAL #1   Title  The patient will be indep with progression of HEP.    Time   8    Period  Weeks    Target Date  06/02/18      PT LONG TERM GOAL #2   Title  The patient will improve Berg balance scale from 32/56 to > or equal to 42/56 to demo dec'ing risk for falls.    Time  8    Period  Weeks    Target Date  06/02/18      PT LONG TERM GOAL #3   Title  The patinet will improve gait speed from 2.29 ft/sec to > or equal to 2.62 ft/sec to demo transition to full community ambulator classification of gait.    Time  8    Period  Weeks    Target Date  06/02/18      PT LONG TERM GOAL #4   Title  The patient will negotiate community surfaces including curbs, inclines, and paved surfaces x 1000 ft with SPC mod indep.    Time  8    Period  Weeks    Target Date  06/02/18            Plan - 05/29/18 1639    Clinical Impression Statement  Today's skilled session focused on balance activities to improve vestibular and visual systems input. Pt experienced incr. postural sway during high level balance activities and corner balance activities which required incr. vestibular input. Pt required one seated rest break 2/2 BLE fatigue. Pt did not c/o back pain during sesssion, therefore, focused on balance today. Pt would cotninue to benefit from skilled PT to improve safety during functional mobility.     PT Treatment/Interventions  ADLs/Self Care Home Management;Therapeutic exercise;Balance training;Neuromuscular re-education;Gait training;Stair training;Functional mobility training;Therapeutic activities;Orthotic Fit/Training;Patient/family education;DME Instruction;Vestibular;Manual techniques    PT Next Visit Plan  Begin to check goals. supine decompression exercises; work on gait with cane and rt ottobock AFO with medial strut (he has slight inversion tendency) ramp, curb, steps, unlevel; Work on right hip and bil ankle strengthening; posture, postural strengthening.    Consulted and Agree with Plan of Care  Patient       Patient will benefit from skilled therapeutic  intervention in order to improve the following deficits and impairments:  Abnormal gait, Impaired sensation, Decreased balance, Decreased mobility, Difficulty walking, Decreased safety awareness, Postural dysfunction, Pain, Decreased strength  Visit Diagnosis: Unsteadiness on feet  Other abnormalities of gait and mobility  Muscle weakness (generalized)     Problem List Patient Active Problem List   Diagnosis Date Noted  . Genetic testing 05/01/2018  . Adenomatous duodenal polyp   . Diarrhea 05/05/2015  . GERD 12/01/2007  . DYSPHAGIA UNSPECIFIED 12/01/2007  . INTERNAL HEMORRHOIDS 03/06/2007  . BARRETTS ESOPHAGUS 01/29/2007  . DIVERTICULOSIS, COLON 08/01/2005  . COLONIC POLYPS, ADENOMATOUS 07/09/2002    Kenslee Achorn L 05/29/2018, 4:42 PM  [facility] [address] [place], [place], [address] Phone: [phone]   Fax:  [phone]  Name: READE TREFZ MRN: [id number] Date of Birth: [birth year]  Geoffry Paradise, PT,DPT 05/29/18 4:43 PM Phone: [phone] Fax: [phone]

## 2018-06-02 ENCOUNTER — Encounter: Payer: Self-pay | Admitting: Rehabilitative and Restorative Service Providers"

## 2018-06-02 ENCOUNTER — Ambulatory Visit: Payer: Medicare Other | Admitting: Rehabilitative and Restorative Service Providers"

## 2018-06-02 DIAGNOSIS — R2689 Other abnormalities of gait and mobility: Secondary | ICD-10-CM

## 2018-06-02 DIAGNOSIS — M21371 Foot drop, right foot: Secondary | ICD-10-CM

## 2018-06-02 DIAGNOSIS — M6281 Muscle weakness (generalized): Secondary | ICD-10-CM

## 2018-06-02 DIAGNOSIS — R2681 Unsteadiness on feet: Secondary | ICD-10-CM | POA: Diagnosis not present

## 2018-06-02 DIAGNOSIS — R296 Repeated falls: Secondary | ICD-10-CM

## 2018-06-02 NOTE — Therapy (Addendum)
[place] [address] [place], [place], [address] Phone: [phone]   Fax:  [phone]  Physical Therapy Treatment and Discharge Summary  Patient Details  Name: Robert Castro MRN: [id number] Date of Birth: July 04, 1951 Referring Provider (PT): Bing Matter, [place]   Encounter Date: 06/02/2018  PT End of Session - 06/02/18 1526    Visit Number  9    Number of Visits  17    Date for PT Re-Evaluation  06/03/18    Authorization Type  medicare and AARP    PT Start Time  1415    PT Stop Time  1445    PT Time Calculation (min)  30 min    Equipment Utilized During Treatment  Gait belt    Activity Tolerance  Patient tolerated treatment well    Behavior During Therapy  [facility] for tasks assessed/performed       Past Medical History:  Diagnosis Date  . Adenomatous duodenal polyp   . Arthritis   . Barrett's esophagus   . Cataract    both eyes  . 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 Depression   . Diabetes ([place])   . Diverticulosis   . Fatty liver   . GERD (gastroesophageal reflux disease)   . Hyperlipidemia   . Hypertension   . Hypothyroidism   . Internal hemorrhoids   . Leg edema, left   . Osteoarthritis   . Sleep apnea    cpap  . Testosterone deficiency   . Tremors of nervous system   . Umbilical hernia     Past Surgical History:  Procedure Laterality Date  . BACK SURGERY  2009  . CATARACT EXTRACTION, BILATERAL  12/2016  . ESOPHAGOGASTRODUODENOSCOPY (EGD) WITH PROPOFOL N/A 09/16/2017   Procedure: ESOPHAGOGASTRODUODENOSCOPY (EGD) WITH PROPOFOL;  Surgeon: Yetta Flock, MD;  Location: [facility];  Service: Gastroenterology;  Laterality: N/A;  . LUMBAR LAMINECTOMY/ DECOMPRESSION WITH MET-RX  2014  . TONSILLECTOMY  1956  . UMBILICAL HERNIA REPAIR      There were no vitals filed for this visit.  Subjective Assessment - 06/02/18 1419    Subjective  The  patient reports no falls, he is not doing HEP, however feels he is moving better.  He is using [facility] when he is out of the house.  He got the papers to call to set up an appointment for an AFO "that's next on the list."    Pertinent History  diabetes, h/o drinking, back surgery 2009, 2014    Patient Stated Goals  "the biggest thing is that my walking has been greatly impaired by this.  I can't keep up with everyone."  He reports having to turn sideways to go down stairs.  He also notes a funny feeling in his head.     Currently in Pain?  No/denies   "not so bad today."        [facility] PT Assessment - 06/02/18 1428      Ambulation/Gait   Ambulation/Gait  Yes    Ambulation/Gait Assistance  6: Modified independent (Device/Increase time);5: Supervision    Ambulation/Gait Assistance Details  The patient arrived walking mod indep with SPC on level surfaces in clinic.  We could not use AFO today due to patient removing the insoles of his shoes over the weekend and PT did not want his foot directly against metal brace.  PT provided supervision on unlevel surfaces due to intermittent R foot drop and  unlevel, paved surfaces.    Ambulation Distance (Feet)  400 Feet    Assistive device  Straight cane    Gait Pattern  Trendelenburg;Decreased stance time - right;Decreased step length - left;Decreased stride length;Lateral hip instability;Lateral trunk lean to left;Poor foot clearance - right;Decreased weight shift to right;Decreased dorsiflexion - right    Gait velocity  2.25 ft/sec    Curb  6: Modified independent (Device/increase time)    Gait Comments  Patient was able to negotiate curb with mod indep.                     [facility] PT Treatment/Exercise - 06/02/18 1428      Self-Care   Self-Care  Other Self-Care Comments    Other Self-Care Comments   PT and patient discussed home program.  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  PT discussed limitations of further progress without participation in [place].  Recommended he call orthotist to schedule time for brace fitting (paperwork has been sent to [employer]).                PT Short Term Goals - 05/08/18 1417      PT SHORT TERM GOAL #1   Title  The patient will return demo HEP for right hip abduction strengthening, ankle strengthening and balance near support surface.  (STGs due 05/03/18)    Baseline  Patient has been provided HEP, but is not participating with program.    Time  4    Period  Weeks    Status  Partially Met      PT SHORT TERM GOAL #2   Title  The patient will improve Berg from 32/56 to > or equal to 38/56 to demo dec'ing risk for falls.    Baseline  Berg=45/56    Time  4    Period  Weeks    Status  Achieved      PT SHORT TERM GOAL #3   Title  The patient will ambulate with SPC x 400 ft mod indep without loss of balance.    Baseline  Uses no device and needs supervision to CGA. (at eval)/ with SPC and R AFO, PT provides supervision.     Time  4    Period  Weeks    Status  Achieved      PT SHORT TERM GOAL #4   Title  The patient will negotiate 4 steps with one handrail and reciprocal pattern mod indep.    Baseline  The patient needs supervision on steps    Time  4    Period  Weeks    Status  Achieved      PT SHORT TERM GOAL #5   Title  Trial AFOs R LE to determine if this improves functional ambulation.    Time  4    Period  Weeks    Status  Achieved        PT Long Term Goals - 06/02/18 1423      PT LONG TERM GOAL #1   Title  The patient will be indep with progression of HEP.    Time  8    Period  Weeks    Status  Not Met      PT LONG TERM GOAL #2   Title  The patient will improve Berg balance scale from 32/56 to > or equal to 42/56 to demo dec'ing risk  for falls.    Baseline  Met scoring 45/56 on 05/08/2018    Time  8    Period  Weeks    Status  Achieved      PT LONG TERM GOAL #3   Title  The patinet will  improve gait speed from 2.29 ft/sec to > or equal to 2.62 ft/sec to demo transition to full community ambulator classification of gait.    Time  8    Period  Weeks    Status  Not Met      PT LONG TERM GOAL #4   Title  The patient will negotiate community surfaces including curbs, inclines, and paved surfaces x 1000 ft with SPC mod indep.    Baseline  patient is able to perform curbs, inclines and paved surface gait with SPC x 300 ft with supervision (he performs mod indep in the community).     Time  8    Period  Weeks    Status  Partially Met            Plan - 06/02/18 1527    Clinical Impression Statement  The patient met LTG for Berg balance score.  He did not meet other LTGs.  PT recommended we hold further visits until patient obtains AFO as I do not anticipate he will comply with further HEP.      PT Treatment/Interventions  ADLs/Self Care Home Management;Therapeutic exercise;Balance training;Neuromuscular re-education;Gait training;Stair training;Functional mobility training;Therapeutic activities;Orthotic Fit/Training;Patient/family education;DME Instruction;Vestibular;Manual techniques    PT Next Visit Plan  Hold until after AFO consult    Consulted and Agree with Plan of Care  Patient       Patient will benefit from skilled therapeutic intervention in order to improve the following deficits and impairments:  Abnormal gait, Impaired sensation, Decreased balance, Decreased mobility, Difficulty walking, Decreased safety awareness, Postural dysfunction, Pain, Decreased strength  Visit Diagnosis: Unsteadiness on feet  Other abnormalities of gait and mobility  Muscle weakness (generalized)  Foot drop, right  Repeated falls    PHYSICAL THERAPY DISCHARGE SUMMARY  Visits from Start of Care: 9  Current functional level related to goals / functional outcomes: See abive   Remaining deficits: Continued foot drop and imbalance  *Patient did not return after AFO consult  so this note reflects status at time of d/c.   Education / Equipment: Home program.  Plan: Patient agrees to discharge.  Patient goals were partially met. Patient is being discharged due to not returning since the last visit.  ?????         Addendum on 10/16/2018 Robert Castro, PT   Problem List Patient Active Problem List   Diagnosis Date Noted  . Genetic testing 05/01/2018  . Adenomatous duodenal polyp   . Diarrhea 05/05/2015  . GERD 12/01/2007  . DYSPHAGIA UNSPECIFIED 12/01/2007  . INTERNAL HEMORRHOIDS 03/06/2007  . BARRETTS ESOPHAGUS 01/29/2007  . DIVERTICULOSIS, COLON 08/01/2005  . COLONIC POLYPS, ADENOMATOUS 07/09/2002    Robert Castro, PT 06/02/2018, 3:28 PM  [facility] [place] [address] [place], [place], [address] Phone: [phone]   Fax:  [phone]  Name: Robert Castro MRN: [id number] Date of Birth: 04/03/1951

## 2018-06-04 ENCOUNTER — Encounter: Payer: Medicare Other | Admitting: Physical Therapy

## 2018-06-10 ENCOUNTER — Encounter: Payer: Medicare Other | Admitting: Physical Therapy

## 2018-06-12 ENCOUNTER — Encounter: Payer: Medicare Other | Admitting: Rehabilitative and Restorative Service Providers"

## 2018-06-16 ENCOUNTER — Encounter: Payer: Medicare Other | Admitting: Rehabilitative and Restorative Service Providers"

## 2018-06-19 ENCOUNTER — Encounter: Payer: Medicare Other | Admitting: Physical Therapy

## 2018-07-01 ENCOUNTER — Telehealth: Payer: Self-pay

## 2018-07-01 NOTE — Telephone Encounter (Signed)
-----   Message from Roetta Sessions, [place] sent at 02/28/2018  2:53 PM EDT ----- Regarding: labs due in Nov Labs due in November 2019. Orders entered

## 2018-07-01 NOTE — Telephone Encounter (Signed)
Left message informing pt that he needed to come in for labs. Labs hours given.

## 2018-07-08 ENCOUNTER — Other Ambulatory Visit (INDEPENDENT_AMBULATORY_CARE_PROVIDER_SITE_OTHER): Payer: Medicare Other

## 2018-07-08 ENCOUNTER — Telehealth: Payer: Self-pay | Admitting: Gastroenterology

## 2018-07-08 DIAGNOSIS — K709 Alcoholic liver disease, unspecified: Secondary | ICD-10-CM

## 2018-07-08 DIAGNOSIS — Z86010 Personal history of colonic polyps: Secondary | ICD-10-CM

## 2018-07-08 LAB — HEPATIC FUNCTION PANEL
ALT: 47 U/L (ref 0–53)
AST: 35 U/L (ref 0–37)
Albumin: 4.7 g/dL (ref 3.5–5.2)
Alkaline Phosphatase: 73 U/L (ref 39–117)
Bilirubin, Direct: 0.2 mg/dL (ref 0.0–0.3)
TOTAL PROTEIN: 6.9 g/dL (ref 6.0–8.3)
Total Bilirubin: 0.7 mg/dL (ref 0.2–1.2)

## 2018-07-08 NOTE — Telephone Encounter (Signed)
Routed to Dr. Havery Moros, [facility] for when you review his labs.

## 2018-07-08 NOTE — Telephone Encounter (Signed)
I reviewed his labs. LFTs have mostly normalized at this point which is excellent news. He should continue to abstain from alcohol. I would like to see him back for a routine office visit in January sometime. Thanks

## 2018-07-09 NOTE — Telephone Encounter (Signed)
Pt states he has additional questions about labs. Best call back [phone].

## 2018-07-09 NOTE — Telephone Encounter (Signed)
Patient returning phone call [phone].

## 2018-07-09 NOTE — Telephone Encounter (Signed)
Spoke to patient, told him the same thing, labs good, abstinence from alcohol and follow up in January.

## 2018-07-09 NOTE — Telephone Encounter (Signed)
Patient returning phone call 

## 2018-07-09 NOTE — Telephone Encounter (Signed)
Left message for patient I was returning his call.

## 2018-07-09 NOTE — Telephone Encounter (Signed)
Left patient a detailed message, asked him to call back to make a follow up visit for January.

## 2018-07-15 ENCOUNTER — Ambulatory Visit: Payer: Medicare Other | Admitting: Rehabilitative and Restorative Service Providers"

## 2018-07-16 ENCOUNTER — Ambulatory Visit: Payer: Medicare Other | Admitting: Rehabilitative and Restorative Service Providers"

## 2018-08-12 ENCOUNTER — Telehealth: Payer: Self-pay | Admitting: Gastroenterology

## 2018-08-12 NOTE — Telephone Encounter (Signed)
Patient is due the 3rd and final injection of Twinrix after 08/31/18 He is scheduled to come see Dr Havery Moros on 09/16/18 at 3:30 pm It works better on his schedule to take care of the injection on that day. He works 3rd shift.

## 2018-08-14 ENCOUNTER — Other Ambulatory Visit: Payer: Self-pay | Admitting: Urology

## 2018-08-21 NOTE — Patient Instructions (Signed)
Robert Castro  1951-04-08    Your procedure is scheduled on:  08-28-2018   Report to [facility] Main  Entrance, Report to admitting at  5:30 AM    Call this number if you have problems the morning of surgery [phone]        Remember: Do not eat food or drink liquids :After Midnight.                                      BRUSH YOUR TEETH MORNING OF SURGERY AND RINSE YOUR MOUTH OUT, NO CHEWING GUM CANDY OR MINTS.     Take these medicines the morning of surgery with A SIP OF WATER:  Amlodipine,  Arimidex,  Atorvastatin (lipitor),  Bupropion (wellbutrin),  Sertraline (zoloft),  Armour thyroid, Diazepam if needed  DO NOT TAKE ANY DIABETIC MEDICATIONS DAY OF YOUR SURGERY                                   You may not have any metal on your body including hair pins and               piercings  Do not wear jewelry, make-up, lotions, powders or perfumes, deodorant                     Men may shave face and neck.       Do not bring valuables to the hospital. [place].  Contacts, dentures or bridgework may not be worn into surgery.  Leave suitcase in the car. After surgery it may be brought to your room.     _____________________________________________________________________             [facility] - Preparing for Surgery Before surgery, you can play an important role.  Because skin is not sterile, your skin needs to be as free of germs as possible.  You can reduce the number of germs on your skin by washing with CHG (chlorahexidine gluconate) soap before surgery.  CHG is an antiseptic cleaner which kills germs and bonds with the skin to continue killing germs even after washing. Please DO NOT use if you have an allergy to CHG or antibacterial soaps.  If your skin becomes reddened/irritated stop using the CHG and inform your nurse when you arrive at Short Stay. Do not shave (including legs and  underarms) for at least 48 hours prior to the first CHG shower.  You may shave your face/neck. Please follow these instructions carefully:  1.  Shower with CHG Soap the night before surgery and the  morning of Surgery.  2.  If you choose to wash your hair, wash your hair first as usual with your  normal  shampoo.  3.  After you shampoo, rinse your hair and body thoroughly to remove the  shampoo.                            4.  Use CHG as you would any other liquid soap.  You can apply chg directly  to the skin and wash  Gently with a scrungie or clean washcloth.  5.  Apply the CHG Soap to your body ONLY FROM THE NECK DOWN.   Do not use on face/ open                           Wound or open sores. Avoid contact with eyes, ears mouth and genitals (private parts).                       Wash face,  Genitals (private parts) with your normal soap.             6.  Wash thoroughly, paying special attention to the area where your surgery  will be performed.  7.  Thoroughly rinse your body with warm water from the neck down.  8.  DO NOT shower/wash with your normal soap after using and rinsing off  the CHG Soap.             9.  Pat yourself dry with a clean towel.            10.  Wear clean pajamas.            11.  Place clean sheets on your bed the night of your first shower and do not  sleep with pets. Day of Surgery : Do not apply any lotions/deodorants the morning of surgery.  Please wear clean clothes to the hospital/surgery center.  FAILURE TO FOLLOW THESE INSTRUCTIONS MAY RESULT IN THE CANCELLATION OF YOUR SURGERY PATIENT SIGNATURE_________________________________  NURSE SIGNATURE__________________________________  ________________________________________________________________________

## 2018-08-22 ENCOUNTER — Inpatient Hospital Stay (HOSPITAL_COMMUNITY)
Admission: RE | Admit: 2018-08-22 | Discharge: 2018-08-22 | Disposition: A | Discharge status: Home / Self Care | Payer: Medicare Other | Source: Ambulatory Visit

## 2018-09-16 ENCOUNTER — Encounter: Payer: Self-pay | Admitting: Gastroenterology

## 2018-09-16 ENCOUNTER — Ambulatory Visit (INDEPENDENT_AMBULATORY_CARE_PROVIDER_SITE_OTHER): Payer: Medicare Other | Admitting: Gastroenterology

## 2018-09-16 VITALS — BP 134/90 | HR 84 | Ht 72.0 in | Wt 255.0 lb

## 2018-09-16 DIAGNOSIS — D132 Benign neoplasm of duodenum: Secondary | ICD-10-CM

## 2018-09-16 DIAGNOSIS — K709 Alcoholic liver disease, unspecified: Secondary | ICD-10-CM

## 2018-09-16 DIAGNOSIS — Z86010 Personal history of colonic polyps: Secondary | ICD-10-CM

## 2018-09-16 DIAGNOSIS — Z23 Encounter for immunization: Secondary | ICD-10-CM | POA: Diagnosis not present

## 2018-09-16 DIAGNOSIS — K760 Fatty (change of) liver, not elsewhere classified: Secondary | ICD-10-CM | POA: Diagnosis not present

## 2018-09-16 NOTE — Patient Instructions (Addendum)
Normal BMI (Body Mass Index- based on height and weight) is between 23 and 30. Your BMI today is Body mass index is 34.58 kg/m. [name] Please consider follow up  regarding your BMI with your Primary Care Provider.   We are giving you your third and final Twinrix vaccine today to protect you against Hepatitis A and B.    You have been scheduled for an abdominal ultrasound at [facility] (1st floor of hospital) on Monday, 09-22-18 at 8:00am. Please arrive 15 minutes prior to your appointment for registration. Make certain not to have anything to eat or drink 6 hours prior to your appointment. Should you need to reschedule your appointment, please contact radiology at [phone]. This test typically takes about 30 minutes to perform.   Thank you for entrusting me with your care and for choosing [facility], Dr. Kukuihaele Cellar

## 2018-09-16 NOTE — Progress Notes (Signed)
HPI :  68 year old male here for a follow-up visit:  He previously had elevations in his liver enzymes. Serologic workup for chronic liver diseases has been negative. He's had prior imaging showing fatty infiltration of the liver. He has a history of diabetes which had been difficult to control until recently. He also has significant alcohol use, consuming anywhere from 2-6 mixed drinks per day. LFTs repeated in November and ALT down to 47. I had advised alcohol cessation however he has not been able to do that, he states he's been drinking for years and it is hard for him to change his habit. He has been to [place] in the past. He inquires about his risks for cirrhosis which we discussed today, he is requesting an ultrasound to reassess his liver. He had no evidence of cirrhosis about a year ago on his ultrasound. He otherwise was not immune to hepatitis A and B, underwent Twinrix vaccine is due for his last dose today.  Otherwise I performed a surveillance colonoscopy for him last July. He had 21 polyps removed, 20 of which were considered adenomatous. I referred him to a Dietitian and had lab testing done. Variant of unclear significance noted, with ATM gene. He is due for surveillance colonoscopy this upcoming July.  He otherwise had followed up with Dr. 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 He had a piecemeal resection performed at [facility] by Dr. Mont Dutton in February 2019, and a follow up EGD in August 2019 without any evidence of recurrence.  He's otherwise been followed by Neurology for his neuropathy, which was attributed to his diabetes, and states that is now under better control since I've last seen him.  Prior evaluation: US done 09/11/2017 - fatty infiltration, multiple gallstones, non larger than 30m, no obvious cirrhosis  Colonoscopy 03/20/2018 - cecal AVM, 21 polyps removed, fair prep, diverticulosis - 20 polyps adenoma, referred to  genetic counselor  EGD 04/15/2018 - duodenal scar with small nodule at edge - biopsies done - no residual adenoma EGD 10/16/17 - Dr. BMont Dutton- piecemal resection of duodenal adenoma via EMR - path c/w adenoma EGD 10/07/2017 - [facility], stomach filled with food, procedure aborted EGD 09/16/2017 - polypectomy was planned but procedure aborted due to size of polyp and spasm, referred to [facility]  EGD 07/15/2017 - irregular z-line, multiple gastric polyps, duodenal polyp,  Colonoscopy 09/18/2012 - small polyps, diverticulosis, one TA - recall in 5 years   Past Medical History:  Diagnosis Date  . Adenomatous duodenal polyp   . Arthritis   . Barrett's esophagus   . Cataract    both eyes  . 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 Depression   . Diabetes ([place]   . Diverticulosis   . Fatty liver   . GERD (gastroesophageal reflux disease)   . Hyperlipidemia   . Hypertension   . Hypothyroidism   . Internal hemorrhoids   . Leg edema, left   . Osteoarthritis   . Sleep apnea    cpap  . Testosterone deficiency   . Tremors of nervous system   . Umbilical hernia      Past Surgical History:  Procedure Laterality Date  . BACK SURGERY  2009  . CATARACT EXTRACTION, BILATERAL  12/2016  . ESOPHAGOGASTRODUODENOSCOPY (EGD) WITH PROPOFOL N/A 09/16/2017   Procedure: ESOPHAGOGASTRODUODENOSCOPY (EGD) WITH PROPOFOL;  Surgeon: AYetta Flock MD;  Location: [facility];  Service: Gastroenterology;  Laterality:  N/A;  . LUMBAR LAMINECTOMY/ DECOMPRESSION WITH MET-RX  2014  . TONSILLECTOMY  1956  . UMBILICAL HERNIA REPAIR     Family History  Problem Relation Age of Onset  . Atrial fibrillation Brother   . Cancer Mother 30       abdominal cancer, unk name of cancer type  . Heart attack Maternal Grandfather   . Stroke Maternal Grandfather   . Heart attack Paternal Grandfather   . Colon cancer Neg Hx   . Stomach cancer Neg Hx   . Colon polyps Neg Hx   .  Esophageal cancer Neg Hx   . Rectal cancer Neg Hx    Social History   Tobacco Use  . Smoking status: Former Smoker    Last attempt to quit: 08/25/2002    Years since quitting: 16.0  . Smokeless tobacco: Never Used  . Tobacco comment: in college only 40 years ago  Substance Use Topics  . Alcohol use: Yes    Alcohol/week: 14.0 - 28.0 standard drinks    Types: 14 - 28 Standard drinks or equivalent per week    Comment: varies  daily drinker  . Drug use: No   Current Outpatient Medications  Medication Sig Dispense Refill  . ALPHA LIPOIC ACID PO Take 400 mg by mouth daily.    [name] amLODipine (NORVASC) 10 MG tablet Take 10 mg Daily by mouth.     [name] amphetamine-dextroamphetamine (ADDERALL XR) 30 MG 24 hr capsule Take 30 mg daily by mouth.     [name] amphetamine-dextroamphetamine (ADDERALL) 30 MG tablet Take 30 mg by mouth daily.    [name] anastrozole (ARIMIDEX) 1 MG tablet Take 1 mg daily by mouth.    [name] ascorbic acid (VITAMIN C) 500 MG tablet Take 1,000 mg by mouth daily.    [name] aspirin EC 81 MG tablet Take 81 mg by mouth daily.    [name] atorvastatin (LIPITOR) 10 MG tablet Take 10 mg by mouth daily.     [name] buPROPion (WELLBUTRIN XL) 300 MG 24 hr tablet Take 300 mg by mouth Daily.     . chlorhexidine (PERIDEX) 0.12 % solution Use as directed 10 mLs in the mouth or throat 2 (two) times daily as needed.   0  . Cholecalciferol (VITAMIN D-1000 MAX ST) 1000 units tablet Take 1,000 Units by mouth daily.     . diazepam (VALIUM) 5 MG tablet Take 5-10 mg by mouth every 6 (six) hours as needed (for spasms).     . diclofenac (VOLTAREN) 75 MG EC tablet Take 75 mg by mouth 2 (two) times daily as needed (for pain.).     [name] fluocinonide ointment (LIDEX) 4.25 % Apply 1 application topically 2 (two) times daily as needed (IRRITATION).    . fluticasone (FLONASE) 50 MCG/ACT nasal spray Place 1 spray into both nostrils daily as needed for allergies or rhinitis.    . folic acid (FOLVITE) 956 MCG tablet Take 400 mcg by mouth daily.      . Lancets (ONETOUCH ULTRASOFT) lancets 1 each by Misc.(Non-Drug; Combo Route) route daily. Dx E11.9    . metFORMIN (GLUCOPHAGE-XR) 500 MG 24 hr tablet Take 1,000 mg by mouth 2 (two) times daily.    [name] MILK THISTLE PO Take 1 capsule by mouth daily.    . Multiple Vitamin (MULTIVITAMIN) tablet Take 1 tablet by mouth daily.    . Multiple Vitamins-Minerals (PRESERVISION AREDS 2 PO) Take 1 capsule by mouth 2 (two) times daily.     . Multiple Vitamins-Minerals (PRESERVISION  AREDS 2 PO) Take by mouth.    . olmesartan (BENICAR) 20 MG tablet Take 20 mg by mouth daily.    . Omega-3 Fatty Acids (FISH OIL) 1200 MG CAPS Take 1 capsule by mouth daily.     [name] omeprazole (PRILOSEC) 20 MG capsule Take 20 mg by mouth daily.    [name] pyridOXINE (B-6) 50 MG tablet Take 50 mg by mouth daily.    . Semaglutide (OZEMPIC, 0.25 OR 0.5 MG/DOSE, [place]) Inject 0.5 mLs into the skin every 7 (seven) days.    [name] sertraline (ZOLOFT) 100 MG tablet Take 200 mg by mouth daily.     [name] testosterone cypionate (DEPOTESTOTERONE CYPIONATE) 200 MG/ML injection Inject 200 mg into the muscle every 14 (fourteen) days.   1  . Thiamine HCl (B-1 PO) Take 300 mg by mouth daily.    [name] thyroid (ARMOUR) 60 MG tablet Take 60 mg by mouth daily.    . traMADol (ULTRAM) 50 MG tablet Take 50 mg by mouth every 6 (six) hours as needed (for pain.).     [name] TURMERIC PO Take 1,000 mg by mouth daily.     . vitamin E 200 UNIT capsule Take 200 Units by mouth daily.    [name] zolpidem (AMBIEN) 10 MG tablet Take 10 mg by mouth daily as needed. For sleep     No current facility-administered medications for this visit.    No Known Allergies   Review of Systems: All systems reviewed and negative except where noted in HPI.     Lab Results  Component Value Date   ALT 47 07/08/2018   AST 35 07/08/2018   ALKPHOS 73 07/08/2018   BILITOT 0.7 07/08/2018     Physical Exam: BP 134/90   Pulse 84   Ht 6' (1.829 m)   Wt 255 lb (115.7 kg)   BMI 34.58 kg/m  Constitutional:  Pleasant,well-developed, male in no acute distress. HEENT: Normocephalic and atraumatic. Conjunctivae are normal. No scleral icterus. Neck supple.  Cardiovascular: Normal rate, regular rhythm.  Pulmonary/chest: Effort normal and breath sounds normal. No wheezing, rales or rhonchi. Abdominal: Soft, protuberant, nontender.  There are no masses palpable. No hepatomegaly. Extremities: no edema Lymphadenopathy: No cervical adenopathy noted. Neurological: Alert and oriented to person place and time. Skin: Skin is warm and dry. No rashes noted. Psychiatric: Normal mood and affect. Behavior is normal.   ASSESSMENT AND PLAN: 68 year old male here for reassessment of the following issues  Alcoholic liver disease / fatty liver - chronic elevation in liver enzymes with fatty liver noted on ultrasound and CT scan. He has not had any evidence of overt cirrhosis. Fatty liver also could be in part due to DM and obesity as well. His pattern of transaminitis is not c/w that of alcohol. Regardless, moving forward given findings to date I recommend he abstain from alcohol. He admits this will be difficult to do and gave no assurances he will try. He denies any symptoms of withdrawal the past. He will work on this. Otherwise he will also work on weight loss and management of his diabetes. His ALT had come down a bit on last draw, we'll continue to check this every 6 months or so. He is going to see his primary care for routine labs next week. Recommend LFTs, checking his platelet count as well as INR. I have counseled him on the long-term risks of steatohepatitis and cirrhosis. He is quite anxious about his risk for cirrhosis although again he has not had any  signs of this as far. He strongly wished to have another ultrasound done to ensure no interval changes, I will order this for him. We'll let him know the results and he should forward lab results me when he gets was done by his primary care. We'll complete his  Twinrix vaccination series today. He should see me again in 6 months. He agreed  History of colon adenomas - 20 adenomas removed on last colonoscopy, genetic testing without any clear cause although does have a variance of unknown significance in ATM gene. Will plan on surveillance colonoscopy July 2020, he agreed.  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   Ackermanville Cellar, MD [facility]

## 2018-09-22 ENCOUNTER — Ambulatory Visit (HOSPITAL_COMMUNITY): Admission: RE | Admit: 2018-09-22 | Payer: Medicare Other | Source: Ambulatory Visit

## 2018-09-24 ENCOUNTER — Telehealth: Payer: Self-pay | Admitting: Gastroenterology

## 2018-09-24 NOTE — Telephone Encounter (Signed)
Pt called in wanting to sched Ultrasound.

## 2018-09-24 NOTE — Telephone Encounter (Signed)
Left message for patient to call back to [phone] to reschedule the [place] that was scheduled on 09/22/2018 at [facility]; Patient was advised to call back if questions/concerns arise to the office at [phone];

## 2018-09-26 ENCOUNTER — Ambulatory Visit (HOSPITAL_COMMUNITY)
Admission: RE | Admit: 2018-09-26 | Discharge: 2018-09-26 | Disposition: A | Discharge status: Home / Self Care | Payer: Medicare Other | Source: Ambulatory Visit | Attending: Gastroenterology | Admitting: Gastroenterology

## 2018-09-26 ENCOUNTER — Telehealth: Payer: Self-pay | Admitting: Gastroenterology

## 2018-09-26 DIAGNOSIS — K709 Alcoholic liver disease, unspecified: Secondary | ICD-10-CM | POA: Insufficient documentation

## 2018-09-26 DIAGNOSIS — Z86010 Personal history of colonic polyps: Secondary | ICD-10-CM | POA: Diagnosis present

## 2018-09-26 DIAGNOSIS — K760 Fatty (change of) liver, not elsewhere classified: Secondary | ICD-10-CM | POA: Insufficient documentation

## 2018-09-26 DIAGNOSIS — D132 Benign neoplasm of duodenum: Secondary | ICD-10-CM | POA: Diagnosis present

## 2018-09-26 NOTE — Telephone Encounter (Signed)
PT is having  Procedure done today 1-31 and would like to know if he could know the results this afternoon due to [place] seas and will be out for 3weeks. Pt is fine if a detailed message is left.

## 2018-09-26 NOTE — Telephone Encounter (Signed)
Pt is calling requesting [place] results. [place] was done this am and results are in epic, he is going out to the country and wanted to know results before he leaves. Please advise.

## 2018-09-26 NOTE — Telephone Encounter (Signed)
Spoke with pt and he is aware. Pt needs labs in 6 mth, reminder in epic.

## 2018-09-26 NOTE — Telephone Encounter (Signed)
Thanks Robert Castro, Can you let him know there are stable changes of fatty liver and some gallstones in his liver. They do not report any evidence of obvious cirrhosis, which is good news. I recommend he try to cut back and stop drinking and we will repeat his labs in 6 months. Thanks

## 2018-10-21 ENCOUNTER — Ambulatory Visit
Admission: RE | Admit: 2018-10-21 | Discharge: 2018-10-21 | Disposition: A | Discharge status: Home / Self Care | Payer: Medicare Other | Source: Ambulatory Visit | Attending: Family Medicine | Admitting: Family Medicine

## 2018-10-21 ENCOUNTER — Other Ambulatory Visit: Payer: Self-pay | Admitting: Family Medicine

## 2018-10-21 ENCOUNTER — Encounter: Payer: Self-pay | Admitting: Radiology

## 2018-10-21 DIAGNOSIS — M7989 Other specified soft tissue disorders: Principal | ICD-10-CM

## 2018-10-21 DIAGNOSIS — R109 Unspecified abdominal pain: Secondary | ICD-10-CM

## 2018-10-21 DIAGNOSIS — R1900 Intra-abdominal and pelvic swelling, mass and lump, unspecified site: Secondary | ICD-10-CM

## 2018-10-21 DIAGNOSIS — M79604 Pain in right leg: Secondary | ICD-10-CM

## 2018-10-21 MED ORDER — IOPAMIDOL (ISOVUE-300) INJECTION 61%
100.0000 mL | Freq: Once | INTRAVENOUS | Status: AC | PRN
  Administered 2018-10-21: 100 mL via INTRAVENOUS

## 2018-10-29 ENCOUNTER — Telehealth: Payer: Self-pay | Admitting: Neurology

## 2018-10-29 ENCOUNTER — Other Ambulatory Visit: Payer: Self-pay | Admitting: Family Medicine

## 2018-10-29 DIAGNOSIS — M79661 Pain in right lower leg: Secondary | ICD-10-CM

## 2018-10-29 NOTE — Telephone Encounter (Signed)
Pt was last seen by Dr. Rexene Alberts 08/2017 for tremors. Pt is now being referred for Neuropathy and would like to switch from Dr. Rexene Alberts to Dr. Aletha Halim. Please advise.

## 2018-10-29 NOTE — Telephone Encounter (Signed)
Okay by me. Of note, has also seen [facility] for neuropathy and falls in July 2019 and had w/u in the form of blood work and brain MRI. He has a FU appt with [facility] neuro pending for July 2020.

## 2018-10-29 NOTE — Telephone Encounter (Signed)
Okay for switch

## 2018-10-31 ENCOUNTER — Other Ambulatory Visit: Payer: Medicare Other

## 2018-11-18 ENCOUNTER — Ambulatory Visit (HOSPITAL_COMMUNITY): Admission: RE | Admit: 2018-11-18 | Payer: Medicare Other | Source: Home / Self Care | Admitting: Urology

## 2018-11-18 ENCOUNTER — Encounter (HOSPITAL_COMMUNITY): Admission: RE | Payer: Self-pay | Source: Home / Self Care

## 2018-11-18 SURGERY — TURP (TRANSURETHRAL RESECTION OF PROSTATE)
Anesthesia: General

## 2018-12-02 ENCOUNTER — Encounter: Payer: Medicare Other | Admitting: Vascular Surgery

## 2018-12-02 ENCOUNTER — Encounter (HOSPITAL_COMMUNITY): Payer: Medicare Other

## 2018-12-16 ENCOUNTER — Telehealth: Payer: Self-pay

## 2018-12-16 NOTE — Telephone Encounter (Signed)
I contacted the pt and left a vn requesting a call back to discuss changing 12/26/18 appt to a video visit.  Due to current COVID 19 pandemic, our office is severely reducing in office visits for at least the next 2 weeks, in order to minimize the risk to our patients and healthcare providers.

## 2018-12-18 NOTE — Telephone Encounter (Signed)
Requested a call back to discuss x2

## 2018-12-23 NOTE — Telephone Encounter (Addendum)
Requested a call back x3 to discuss changing visit to a virtual video visit. Patient has been removed from 12/26/18 schedule.

## 2018-12-26 ENCOUNTER — Institutional Professional Consult (permissible substitution): Payer: Medicare Other | Admitting: Neurology

## 2018-12-26 ENCOUNTER — Encounter

## 2019-03-18 ENCOUNTER — Encounter: Payer: Self-pay | Admitting: Gastroenterology

## 2019-03-30 ENCOUNTER — Telehealth: Payer: Self-pay

## 2019-03-30 DIAGNOSIS — K709 Alcoholic liver disease, unspecified: Secondary | ICD-10-CM

## 2019-03-30 DIAGNOSIS — K760 Fatty (change of) liver, not elsewhere classified: Secondary | ICD-10-CM

## 2019-03-30 NOTE — Telephone Encounter (Signed)
-----   Message from Yetta Flock, MD sent at 03/27/2019  5:51 PM EDT ----- Regarding: RE: labs Yes due for repeat LFTs now . Thanks ----- Message ----- From: Roetta Sessions, CMA Sent: 03/27/2019   5:08 PM EDT To: Yetta Flock, MD Subject: FW: labs                                       Dr. Havery Moros, does pt need more than LFTs? Thank you.  Jan   ----- Message ----- From: Algernon Huxley, RN Sent: 03/27/2019 To: Roetta Sessions, CMA Subject: labs                                           Pt need labs done, see phone note dated 09/26/18.

## 2019-03-30 NOTE — Telephone Encounter (Signed)
Called pt and LM for pt that he needs to go to the lab.

## 2019-03-31 ENCOUNTER — Other Ambulatory Visit (INDEPENDENT_AMBULATORY_CARE_PROVIDER_SITE_OTHER): Payer: Medicare Other

## 2019-03-31 ENCOUNTER — Telehealth: Payer: Self-pay | Admitting: Gastroenterology

## 2019-03-31 DIAGNOSIS — K760 Fatty (change of) liver, not elsewhere classified: Secondary | ICD-10-CM

## 2019-03-31 DIAGNOSIS — K709 Alcoholic liver disease, unspecified: Secondary | ICD-10-CM | POA: Diagnosis not present

## 2019-04-01 LAB — HEPATIC FUNCTION PANEL
ALT: 54 U/L — ABNORMAL HIGH (ref 0–53)
AST: 32 U/L (ref 0–37)
Albumin: 4.7 g/dL (ref 3.5–5.2)
Alkaline Phosphatase: 91 U/L (ref 39–117)
Bilirubin, Direct: 0.1 mg/dL (ref 0.0–0.3)
Total Bilirubin: 0.5 mg/dL (ref 0.2–1.2)
Total Protein: 6.8 g/dL (ref 6.0–8.3)

## 2019-04-01 NOTE — Telephone Encounter (Signed)
Spoke to Tammy at patient's PCP, they do have access to Epic and will be able to see patient's lab results from yesterday. She will give heads-up to Bing Matter PA to look at them. Left message on patient's phone stating above.

## 2019-04-03 ENCOUNTER — Telehealth: Payer: Self-pay | Admitting: Gastroenterology

## 2019-04-03 NOTE — Telephone Encounter (Signed)
Called patient and let him know as soon as his lab results are reviewed by Dr. Havery Moros and released I will call him with them. Also that he is on vacation, so it may not be today

## 2019-04-03 NOTE — Telephone Encounter (Signed)
Pt inquired about results of blood test.

## 2019-04-06 ENCOUNTER — Telehealth: Payer: Self-pay

## 2019-04-06 NOTE — Telephone Encounter (Signed)
There is risk for hepatotoxicity with Lamisil but the risk is thought to be low. That being said, would repeat LFTs in 4-6 weeks to recheck this and ensure stable. Thanks

## 2019-04-06 NOTE — Telephone Encounter (Signed)
Patient is on Lamisil for a toenail infx. His PCP put him on it, but said she did not want him on it for more than 2-3 months because it could cause liver damage. I had just given him the results of his hepatic function panel and that is was only mildly elevated, but he is concerned and wants Dr. Doyne Keel opinion

## 2019-04-07 NOTE — Telephone Encounter (Signed)
Called patient and gave him Dr. Doyne Keel answer reference Lamisil possible side effects. Let him know we would recheck his LFTs in about 5 weeks to ensure stable.

## 2019-04-08 ENCOUNTER — Other Ambulatory Visit: Payer: Self-pay | Admitting: Orthopedic Surgery

## 2019-04-08 DIAGNOSIS — M4014 Other secondary kyphosis, thoracic region: Secondary | ICD-10-CM

## 2019-04-08 DIAGNOSIS — R262 Difficulty in walking, not elsewhere classified: Secondary | ICD-10-CM

## 2019-04-09 ENCOUNTER — Ambulatory Visit (AMBULATORY_SURGERY_CENTER): Payer: Self-pay

## 2019-04-09 ENCOUNTER — Other Ambulatory Visit: Payer: Self-pay

## 2019-04-09 VITALS — Ht 72.0 in | Wt 264.4 lb

## 2019-04-09 DIAGNOSIS — Z86010 Personal history of colonic polyps: Secondary | ICD-10-CM

## 2019-04-09 MED ORDER — NA SULFATE-K SULFATE-MG SULF 17.5-3.13-1.6 GM/177ML PO SOLN: 1.0000 | Freq: Once | ORAL | 0 refills | Status: AC

## 2019-04-09 NOTE — Progress Notes (Signed)
Denies allergies to eggs or soy products. Denies complication of anesthesia or sedation. Denies use of weight loss medication. Denies use of O2.   Emmi instructions given for colonoscopy.   Patient was given a 2 day prep because his last colonoscopy the prep was just fair. Pre-Visit took an hour reviewing his two day instructions as well as his medical history and medications.  Patient verbalizes understanding of instructions.

## 2019-04-22 ENCOUNTER — Encounter: Payer: Medicare Other | Admitting: Gastroenterology

## 2019-04-22 ENCOUNTER — Ambulatory Visit
Admission: RE | Admit: 2019-04-22 | Discharge: 2019-04-22 | Disposition: A | Discharge status: Home / Self Care | Payer: Medicare Other | Source: Ambulatory Visit | Attending: Orthopedic Surgery | Admitting: Orthopedic Surgery

## 2019-04-22 ENCOUNTER — Other Ambulatory Visit: Payer: Self-pay

## 2019-04-22 DIAGNOSIS — M4014 Other secondary kyphosis, thoracic region: Secondary | ICD-10-CM

## 2019-04-22 DIAGNOSIS — R262 Difficulty in walking, not elsewhere classified: Secondary | ICD-10-CM

## 2019-04-23 ENCOUNTER — Telehealth: Payer: Self-pay

## 2019-04-23 NOTE — Telephone Encounter (Signed)
Covid-19 screening questions   Do you now or have you had a fever in the last 14 days? NO   Do you have any respiratory symptoms of shortness of breath or cough now or in the last 14 days? NO  Do you have any family members or close contacts with diagnosed or suspected Covid-19 in the past 14 days? NO  Have you been tested for Covid-19 and found to be positive? NO        

## 2019-04-24 ENCOUNTER — Other Ambulatory Visit: Payer: Self-pay

## 2019-04-24 ENCOUNTER — Encounter: Payer: Medicare Other | Admitting: Gastroenterology

## 2019-04-24 ENCOUNTER — Ambulatory Visit (AMBULATORY_SURGERY_CENTER): Payer: Medicare Other | Admitting: Gastroenterology

## 2019-04-24 ENCOUNTER — Encounter: Payer: Self-pay | Admitting: Gastroenterology

## 2019-04-24 VITALS — BP 131/89 | HR 74 | Temp 98.0°F | Resp 19 | Ht 72.0 in | Wt 264.0 lb

## 2019-04-24 DIAGNOSIS — K635 Polyp of colon: Secondary | ICD-10-CM

## 2019-04-24 DIAGNOSIS — Z86010 Personal history of colonic polyps: Secondary | ICD-10-CM

## 2019-04-24 DIAGNOSIS — D123 Benign neoplasm of transverse colon: Secondary | ICD-10-CM

## 2019-04-24 DIAGNOSIS — D120 Benign neoplasm of cecum: Secondary | ICD-10-CM | POA: Diagnosis not present

## 2019-04-24 MED ORDER — SODIUM CHLORIDE 0.9 % IV SOLN: 500.0000 mL | Freq: Once | INTRAVENOUS | Status: DC

## 2019-04-24 NOTE — Op Note (Signed)
[name] Patient Name: Robert Castro Procedure Date: 04/24/2019 1:35 PM MRN: [id number] Endoscopist: Remo Lipps P. Havery Moros , MD Age: 68 Referring MD:  Date of Birth: [birth year] Gender: Male Account #: [id number] Procedure:                Colonoscopy Indications:              Surveillance: History of numerous (> 20) adenomas                            on last colonoscopy one year ago Medicines:                Monitored Anesthesia Care Procedure:                Pre-Anesthesia Assessment:                           - Prior to the procedure, a History and Physical                            was performed, and patient medications and                            allergies were reviewed. The patient's tolerance of                            previous anesthesia was also reviewed. The risks                            and benefits of the procedure and the sedation                            options and risks were discussed with the patient.                            All questions were answered, and informed consent                            was obtained. Prior Anticoagulants: The patient has                            taken no previous anticoagulant or antiplatelet                            agents. ASA Grade Assessment: III - A patient with                            severe systemic disease. After reviewing the risks                            and benefits, the patient was deemed in                            satisfactory condition to undergo the procedure.  After obtaining informed consent, the colonoscope                            was passed under direct vision. Throughout the                            procedure, the patient's blood pressure, pulse, and                            oxygen saturations were monitored continuously. The                            Colonoscope was introduced through the anus and                            advanced to the the  cecum, identified by                            appendiceal orifice and ileocecal valve. The                            colonoscopy was performed without difficulty. The                            patient tolerated the procedure well. The quality                            of the bowel preparation was adequate. The                            ileocecal valve, appendiceal orifice, and rectum                            were photographed. Scope In: 1:38:00 PM Scope Out: 2:10:59 PM Scope Withdrawal Time: 0 hours 26 minutes 59 seconds  Total Procedure Duration: 0 hours 32 minutes 59 seconds  Findings:                 The perianal and digital rectal examinations were                            normal.                           A single large angiodysplastic lesion was found in                            the cecum.                           A diminutive polyp was found in the cecum. The                            polyp was sessile. The polyp was removed with a  cold snare. Resection and retrieval were complete.                           A 3 mm polyp was found in the hepatic flexure. The                            polyp was sessile. The polyp was removed with a                            cold snare. Resection and retrieval were complete.                           Four sessile polyps were found in the transverse                            colon. The polyps were 3 to 4 mm in size. These                            polyps were removed with a cold snare. Resection                            and retrieval were complete.                           Multiple small-mouthed diverticula were found in                            the left colon.                           The colon was extremely spastic which prolonged the                            procedure.                           The exam was otherwise without abnormality. Complications:            No immediate complications.  Estimated blood loss:                            Minimal. Estimated Blood Loss:     Estimated blood loss was minimal. Impression:               - A single colonic angiodysplastic lesion.                           - One diminutive polyp in the cecum, removed with a                            cold snare. Resected and retrieved.                           - One 3 mm polyp at the hepatic flexure, removed  with a cold snare. Resected and retrieved.                           - Four 3 to 4 mm polyps in the transverse colon,                            removed with a cold snare. Resected and retrieved.                           - Diverticulosis in the left colon.                           - Colonic spasm.                           - The examination was otherwise normal. Recommendation:           - Patient has a contact number available for                            emergencies. The signs and symptoms of potential                            delayed complications were discussed with the                            patient. Return to normal activities tomorrow.                            Written discharge instructions were provided to the                            patient.                           - Resume previous diet.                           - Continue present medications.                           - Await pathology results. Remo Lipps P. Hanford Lust, MD 04/24/2019 2:17:28 PM This report has been signed electronically.

## 2019-04-24 NOTE — Progress Notes (Signed)
Called to room to assist during endoscopic procedure.  Patient ID and intended procedure confirmed with present staff. Received instructions for my participation in the procedure from the performing physician.  

## 2019-04-24 NOTE — Patient Instructions (Signed)
Handouts given for polyps and diverticulosis.  YOU HAD AN ENDOSCOPIC PROCEDURE TODAY AT THE [facility]:   Refer to the procedure report that was given to you for any specific questions about what was found during the examination.  If the procedure report does not answer your questions, please call your gastroenterologist to clarify.  If you requested that your care partner not be given the details of your procedure findings, then the procedure report has been included in a sealed envelope for you to review at your convenience later.  YOU SHOULD EXPECT: Some feelings of bloating in the abdomen. Passage of more gas than usual.  Walking can help get rid of the air that was put into your GI tract during the procedure and reduce the bloating. If you had a lower endoscopy (such as a colonoscopy or flexible sigmoidoscopy) you may notice spotting of blood in your stool or on the toilet paper. If you underwent a bowel prep for your procedure, you may not have a normal bowel movement for a few days.  Please Note:  You might notice some irritation and congestion in your nose or some drainage.  This is from the oxygen used during your procedure.  There is no need for concern and it should clear up in a day or so.  SYMPTOMS TO REPORT IMMEDIATELY:   Following lower endoscopy (colonoscopy or flexible sigmoidoscopy):  Excessive amounts of blood in the stool  Significant tenderness or worsening of abdominal pains  Swelling of the abdomen that is new, acute  Fever of 100F or higher  For urgent or emergent issues, a gastroenterologist can be reached at any hour by calling [phone].   DIET:  We do recommend a small meal at first, but then you may proceed to your regular diet.  Drink plenty of fluids but you should avoid alcoholic beverages for 24 hours.  ACTIVITY:  You should plan to take it easy for the rest of today and you should NOT DRIVE or use heavy machinery until tomorrow (because of  the sedation medicines used during the test).    FOLLOW UP: Our staff will call the number listed on your records 48-72 hours following your procedure to check on you and address any questions or concerns that you may have regarding the information given to you following your procedure. If we do not reach you, we will leave a message.  We will attempt to reach you two times.  During this call, we will ask if you have developed any symptoms of COVID 19. If you develop any symptoms (ie: fever, flu-like symptoms, shortness of breath, cough etc.) before then, please call [phone].  If you test positive for Covid 19 in the 2 weeks post procedure, please call and report this information to us.    If any biopsies were taken you will be contacted by phone or by letter within the next 1-3 weeks.  Please call us at [phone] if you have not heard about the biopsies in 3 weeks.    SIGNATURES/CONFIDENTIALITY: You and/or your care partner have signed paperwork which will be entered into your electronic medical record.  These signatures attest to the fact that that the information above on your After Visit Summary has been reviewed and is understood.  Full responsibility of the confidentiality of this discharge information lies with you and/or your care-partner. 

## 2019-04-24 NOTE — Progress Notes (Signed)
PT taken to PACU. Monitors in place. VSS. Report given to RN. 

## 2019-04-24 NOTE — Progress Notes (Signed)
Pt's states no medical or surgical changes since previsit or office visit.   SMvitals and LR temps

## 2019-04-28 ENCOUNTER — Telehealth: Payer: Self-pay

## 2019-04-28 NOTE — Telephone Encounter (Signed)
Left message on follow up call. 

## 2019-04-28 NOTE — Telephone Encounter (Signed)
  Follow up Call-  Call back number 04/24/2019 03/20/2018 07/15/2017  Post procedure Call Back phone  # [phone]  Permission to leave phone message Yes Yes Yes  Some recent data might be hidden     Patient questions:  Do you have a fever, pain , or abdominal swelling? No. Pain Score  0 *  Have you tolerated food without any problems? Yes.    Have you been able to return to your normal activities? Yes.    Do you have any questions about your discharge instructions: Diet   No. Medications  No. Follow up visit  No.  Do you have questions or concerns about your Care? No.  Actions: * If pain score is 4 or above: No action needed, pain <4. 1. Have you developed a fever since your procedure? no  2.   Have you had an respiratory symptoms (SOB or cough) since your procedure? no  3.   Have you tested positive for COVID 19 since your procedure no  4.   Have you had any family members/close contacts diagnosed with the COVID 19 since your procedure?  no   If yes to any of these questions please route to Joylene John, RN and Alphonsa Gin, Therapist, sports.

## 2019-04-29 ENCOUNTER — Encounter: Payer: Self-pay | Admitting: Gastroenterology

## 2019-05-08 ENCOUNTER — Other Ambulatory Visit (INDEPENDENT_AMBULATORY_CARE_PROVIDER_SITE_OTHER): Payer: Medicare Other

## 2019-05-08 ENCOUNTER — Other Ambulatory Visit: Payer: Self-pay

## 2019-05-08 ENCOUNTER — Telehealth: Payer: Self-pay

## 2019-05-08 DIAGNOSIS — K709 Alcoholic liver disease, unspecified: Secondary | ICD-10-CM | POA: Diagnosis not present

## 2019-05-08 LAB — HEPATIC FUNCTION PANEL
ALT: 70 U/L — ABNORMAL HIGH (ref 0–53)
AST: 44 U/L — ABNORMAL HIGH (ref 0–37)
Albumin: 4.7 g/dL (ref 3.5–5.2)
Alkaline Phosphatase: 88 U/L (ref 39–117)
Bilirubin, Direct: 0.2 mg/dL (ref 0.0–0.3)
Total Bilirubin: 0.6 mg/dL (ref 0.2–1.2)
Total Protein: 6.8 g/dL (ref 6.0–8.3)

## 2019-05-08 NOTE — Telephone Encounter (Signed)
Called patient and left message on his voice mail (ok to leave per pt) that next week is time for his 6 week repeat LFT. To please come into our lab in the basement Mon-Fri. Between 7:30am-4:30pm to get that drawn

## 2019-05-08 NOTE — Telephone Encounter (Signed)
-----   Message from Hughie Closs, RN sent at 04/07/2019  9:47 AM EDT ----- Put order in Epic and call patient for repeat LFTs (4-6 wks=9/15(5wks)

## 2019-05-09 ENCOUNTER — Other Ambulatory Visit: Payer: Self-pay

## 2019-05-09 ENCOUNTER — Ambulatory Visit
Admission: RE | Admit: 2019-05-09 | Discharge: 2019-05-09 | Disposition: A | Discharge status: Home / Self Care | Payer: Medicare Other | Source: Ambulatory Visit | Attending: Orthopedic Surgery | Admitting: Orthopedic Surgery

## 2019-05-09 DIAGNOSIS — R262 Difficulty in walking, not elsewhere classified: Secondary | ICD-10-CM

## 2019-05-09 DIAGNOSIS — M4014 Other secondary kyphosis, thoracic region: Secondary | ICD-10-CM

## 2019-05-11 ENCOUNTER — Other Ambulatory Visit: Payer: Self-pay

## 2019-05-11 ENCOUNTER — Telehealth: Payer: Self-pay | Admitting: Gastroenterology

## 2019-05-11 DIAGNOSIS — R7989 Other specified abnormal findings of blood chemistry: Secondary | ICD-10-CM

## 2019-05-11 DIAGNOSIS — K709 Alcoholic liver disease, unspecified: Secondary | ICD-10-CM

## 2019-05-11 NOTE — Telephone Encounter (Signed)
Pt returned your call about lab results, he would like a call back today.

## 2019-05-11 NOTE — Telephone Encounter (Signed)
See results note. 

## 2019-06-09 ENCOUNTER — Other Ambulatory Visit: Payer: Self-pay | Admitting: Orthopedic Surgery

## 2019-06-09 DIAGNOSIS — M4804 Spinal stenosis, thoracic region: Secondary | ICD-10-CM

## 2019-06-09 DIAGNOSIS — Z13820 Encounter for screening for osteoporosis: Secondary | ICD-10-CM

## 2019-06-18 ENCOUNTER — Telehealth: Payer: Self-pay

## 2019-06-18 NOTE — Telephone Encounter (Signed)
Called patient and left message to please call and schedule a routine follow-up office visit with Dr. Havery Moros

## 2019-06-18 NOTE — Telephone Encounter (Signed)
-----   Message from Hughie Closs, RN sent at 04/06/2019 12:12 PM EDT ----- Schedule 3 month F/U ov =07/07/19 for reassessment of liver fx

## 2019-07-06 ENCOUNTER — Telehealth: Payer: Self-pay

## 2019-07-06 NOTE — Telephone Encounter (Signed)
Patient scheduled for 4 month follow-up office visit with Dr. Havery Moros on 08/17/19 to assess liver function. Patient wants to know if you want labs drawn before that

## 2019-07-06 NOTE — Telephone Encounter (Signed)
Yes can draw a CBC and CMET prior to the visit or on that day. Thanks

## 2019-07-07 ENCOUNTER — Other Ambulatory Visit: Payer: Self-pay

## 2019-07-07 DIAGNOSIS — K709 Alcoholic liver disease, unspecified: Secondary | ICD-10-CM

## 2019-07-07 NOTE — Telephone Encounter (Signed)
Called and left a message for patient to please come into our lab 1-2 days before his office visit with Dr. Havery Moros on 08/17/19 for labs. Mon-Fri. 7:30am-4:30pm

## 2019-07-20 ENCOUNTER — Ambulatory Visit
Admission: RE | Admit: 2019-07-20 | Discharge: 2019-07-20 | Disposition: A | Discharge status: Home / Self Care | Payer: Medicare Other | Source: Ambulatory Visit | Attending: Orthopedic Surgery | Admitting: Orthopedic Surgery

## 2019-07-20 ENCOUNTER — Other Ambulatory Visit: Payer: Self-pay

## 2019-07-20 DIAGNOSIS — M4804 Spinal stenosis, thoracic region: Secondary | ICD-10-CM

## 2019-07-20 DIAGNOSIS — Z13820 Encounter for screening for osteoporosis: Secondary | ICD-10-CM

## 2019-08-13 ENCOUNTER — Ambulatory Visit: Payer: Medicare Other | Admitting: Gastroenterology

## 2019-08-17 ENCOUNTER — Ambulatory Visit (INDEPENDENT_AMBULATORY_CARE_PROVIDER_SITE_OTHER): Payer: Medicare Other | Admitting: Gastroenterology

## 2019-08-17 ENCOUNTER — Encounter: Payer: Self-pay | Admitting: Gastroenterology

## 2019-08-17 VITALS — BP 124/70 | HR 96 | Temp 98.3°F | Ht 72.0 in | Wt 265.0 lb

## 2019-08-17 DIAGNOSIS — K760 Fatty (change of) liver, not elsewhere classified: Secondary | ICD-10-CM

## 2019-08-17 DIAGNOSIS — Z86010 Personal history of colonic polyps: Secondary | ICD-10-CM

## 2019-08-17 DIAGNOSIS — Z1159 Encounter for screening for other viral diseases: Secondary | ICD-10-CM

## 2019-08-17 DIAGNOSIS — D132 Benign neoplasm of duodenum: Secondary | ICD-10-CM

## 2019-08-17 NOTE — Patient Instructions (Addendum)
If you are age 70 or older, your body mass index should be between 23-30. Your Body mass index is 35.94 kg/m. If this is out of the aforementioned range listed, please consider follow up with your Primary Care Provider.  If you are age 11 or younger, your body mass index should be between 19-25. Your Body mass index is 35.94 kg/m. If this is out of the aformentioned range listed, please consider follow up with your Primary Care Provider.   You have been scheduled for an endoscopy. Please follow written instructions given to you at your visit today. If you use inhalers (even only as needed), please bring them with you on the day of your procedure.  It was a pleasure to see you today!  Dr. Havery Moros

## 2019-08-17 NOTE — Progress Notes (Signed)
HPI :  68 year old male here for follow-up visit.  I follow him previously for elevations in liver enzymes, history of colon polyps, history of duodenal adenomas.  He ha had elevations in his liver enzymes. Serologic workup for chronic liver diseases has been negative. Prior imaging has showed fatty infiltration of the liver without cirrhosis. He has a history of diabetes which had been difficult to control until recently. He also has significant alcohol use, consuming anywhere from 2-6 mixed drinks per day.   I had previously advised alcohol cessation however he has not been able to do that, he has cut his alcohol use in half however, at most drinking 2-3 beers per night.  He has been vaccinated with Twinrix in September his ALT was 70, AST 44.  He recently had blood work done with his primary care for his yearly physical but we do not have the records of that today.  I asked him for a copy of it.  He most recently had cross-sectional imaging with a CT scan in February of this year showing stable hepatic steatosis without cirrhosis.  Otherwise had a colonoscopy 02/2018. He had 21 polyps removed, 20 of which were considered adenomatous. Referred him to a Dietitian and had lab testing done. Variant of unclear significance noted, with ATM gene. Follow up colonoscopy done August of this year showed 6 diminutive polyps, due for a follow up exam in 3 years.   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  This was previously removed via piecemeal resection performed [facility] by Dr. Mont Dutton in February 2019, and a follow up EGD in August 2019 without any evidence of recurrence. Dr. Mont Dutton had recommended a one year follow up for this polyp, he is overdue.   He is feeling a bit better.  He states his A1c is coming down.  His weights been stable.  He has had significant back surgery with hardware in his lumbar spine, due for another back surgery in February.  He is also had laser procedure  for his prostate and following with physical therapy for his neuropathy.  He denies any abdominal pains.  He does have a history of gallstones and they do not seem to be causing problems.  Prior evaluation: US done 09/11/2017 - fatty infiltration, multiple gallstones, non larger than 36m, no obvious cirrhosis  Colonoscopy 03/20/2018 - cecal AVM, 21 polyps removed, fair prep, diverticulosis - 20 polyps adenoma, referred to genetic counselor  EGD 04/15/2018 - duodenal scar with small nodule at edge - biopsies done - no residual adenoma EGD 10/16/17 - Dr. BMont Dutton- piecemal resection of duodenal adenoma via EMR - path c/w adenoma EGD 10/07/2017 - [facility], stomach filled with food, procedure aborted EGD 09/16/2017 - polypectomy was planned but procedure aborted due to size of polyp and spasm, referred to [facility]  EGD 07/15/2017 - irregular z-line, multiple gastric polyps, duodenal polyp,  Colonoscopy 09/18/2012 - small polyps, diverticulosis, one TA - recall in 5 years  Colonoscopy 04/24/19 - The perianal and digital rectal examinations were normal. - A single large angiodysplastic lesion was found in the cecum. - A diminutive polyp was found in the cecum. The polyp was sessile. The polyp was removed with a cold snare. Resection and retrieval were complete. - A 3 mm polyp was found in the hepatic flexure. The polyp was sessile. The polyp was removed with a cold snare. Resection and retrieval were complete. - Four sessile polyps were found in the transverse colon.  The polyps were 3 to 4 mm in size. These polyps were removed with a cold snare. Resection and retrieval were complete. - Multiple small-mouthed diverticula were found in the left colon. - The colon was extremely spastic which prolonged the procedure. - The exam was otherwise without abnormality.  Path c/w adenomas - repeat colonoscopy in 3 years     Past Medical History:  Diagnosis Date  . Adenomatous duodenal polyp   . Arthritis     . Barrett's esophagus   . Benign enlargement of prostate   . Cataract    both eyes  . 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 Depression   . Diabetes ([place])   . Diverticulosis   . Fatty liver   . GERD (gastroesophageal reflux disease)   . HOH (hard of hearing)   . Hyperlipidemia   . Hypertension   . Hypothyroidism   . Internal hemorrhoids   . Leg edema, left   . Macular degeneration   . Neuromuscular disorder ([place])   . Osteoarthritis   . Sleep apnea    cpap  . Testosterone deficiency   . Tremors of nervous system   . Umbilical hernia      Past Surgical History:  Procedure Laterality Date  . BACK SURGERY  2009  . BACK SURGERY  09/2014   Dr Marykay Lex  . CATARACT EXTRACTION, BILATERAL  12/2016  . ESOPHAGOGASTRODUODENOSCOPY (EGD) WITH PROPOFOL N/A 09/16/2017   Procedure: ESOPHAGOGASTRODUODENOSCOPY (EGD) WITH PROPOFOL;  Surgeon: Yetta Flock, MD;  Location: [facility];  Service: Gastroenterology;  Laterality: N/A;  . LUMBAR LAMINECTOMY/ DECOMPRESSION WITH MET-RX  2014  . TONSILLECTOMY  1956  . UMBILICAL HERNIA REPAIR     Family History  Problem Relation Age of Onset  . Atrial fibrillation Brother   . Cancer Mother 79       abdominal cancer, unk name of cancer type  . Heart attack Maternal Grandfather   . Heart attack Paternal Grandfather   . Colon cancer Neg Hx   . Stomach cancer Neg Hx   . Colon polyps Neg Hx   . Esophageal cancer Neg Hx   . Rectal cancer Neg Hx    Social History   Tobacco Use  . Smoking status: Former Smoker    Quit date: 08/25/2002    Years since quitting: 16.9  . Smokeless tobacco: Never Used  . Tobacco comment: in college only 40 years ago  Substance Use Topics  . Alcohol use: Yes    Alcohol/week: 14.0 - 28.0 standard drinks    Types: 14 - 28 Standard drinks or equivalent per week    Comment: varies  daily drinker  . Drug use: No   Current Outpatient Medications  Medication Sig Dispense  Refill  . ALPHA LIPOIC ACID PO Take 400 mg by mouth daily.    [name] amLODipine (NORVASC) 10 MG tablet Take 10 mg Daily by mouth.     [name] anastrozole (ARIMIDEX) 1 MG tablet Take 1 mg daily by mouth.    [name] ascorbic acid (VITAMIN C) 500 MG tablet Take 1,000 mg by mouth daily.    [name] aspirin EC 81 MG tablet Take 81 mg by mouth daily.    [name] atorvastatin (LIPITOR) 10 MG tablet Take 10 mg by mouth daily.     [name] buPROPion (WELLBUTRIN XL) 300 MG 24 hr tablet Take 300 mg by mouth Daily.     . calcium-vitamin D (OSCAL WITH D) 250-125 MG-UNIT tablet  Take 1 tablet by mouth daily.    . chlorhexidine (PERIDEX) 0.12 % solution Use as directed 10 mLs in the mouth or throat 2 (two) times daily as needed.   0  . Cholecalciferol (VITAMIN D-1000 MAX ST) 1000 units tablet Take 1,000 Units by mouth daily.     . diazepam (VALIUM) 5 MG tablet Take 5-10 mg by mouth every 6 (six) hours as needed (for spasms).     . diclofenac (VOLTAREN) 75 MG EC tablet Take 75 mg by mouth 2 (two) times daily as needed (for pain.).     [name] Dulaglutide (TRULICITY) 1.5 KJ/1.7HX SOPN Inject into the skin.    . fluocinonide ointment (LIDEX) 5.05 % Apply 1 application topically 2 (two) times daily as needed (IRRITATION).    . fluticasone (FLONASE) 50 MCG/ACT nasal spray Place 1 spray into both nostrils daily as needed for allergies or rhinitis.    [name] JARDIANCE 10 MG TABS tablet Take 10 mg by mouth daily.    . Lancets (ONETOUCH ULTRASOFT) lancets 1 each by Misc.(Non-Drug; Combo Route) route daily. Dx E11.9    . metFORMIN (GLUCOPHAGE-XR) 500 MG 24 hr tablet Take 1,000 mg by mouth 2 (two) times daily.    [name] MILK THISTLE PO Take 1 capsule by mouth daily.    . Multiple Vitamins-Minerals (PRESERVISION AREDS 2 PO) Take 1 capsule by mouth 2 (two) times daily.     . Multiple Vitamins-Minerals (PRESERVISION AREDS 2 PO) Take by mouth.    . olmesartan (BENICAR) 20 MG tablet Take 20 mg by mouth daily.    . Omega-3 Fatty Acids (FISH OIL) 1200 MG CAPS Take 1 capsule by  mouth daily.     [name] omeprazole (PRILOSEC) 20 MG capsule Take 20 mg by mouth daily.    . sertraline (ZOLOFT) 100 MG tablet Take 200 mg by mouth daily.     [name] testosterone cypionate (DEPOTESTOTERONE CYPIONATE) 200 MG/ML injection Inject 200 mg into the muscle every 14 (fourteen) days.   1  . Thiamine HCl (B-1 PO) Take 300 mg by mouth daily.    [name] thyroid (ARMOUR) 60 MG tablet Take 60 mg by mouth daily.    . traMADol (ULTRAM) 50 MG tablet Take 50 mg by mouth every 6 (six) hours as needed (for pain.).     [name] TURMERIC PO Take 1,000 mg by mouth daily.     [name] zolpidem (AMBIEN) 10 MG tablet Take 10 mg by mouth daily as needed. For sleep     No current facility-administered medications for this visit.   No Known Allergies   Review of Systems: All systems reviewed and negative except where noted in HPI.   Lab Results  Component Value Date   WBC 6.7 01/30/2008   HGB 15.9 01/30/2008   HCT 46.2 01/30/2008   MCV 94.0 01/30/2008   PLT 219 01/30/2008    No results found for: CREATININE, BUN, NA, K, CL, CO2  Lab Results  Component Value Date   ALT 70 (H) 05/08/2019   AST 44 (H) 05/08/2019   ALKPHOS 88 05/08/2019   BILITOT 0.6 05/08/2019     Physical Exam: BP 124/70   Pulse 96   Temp 98.3 F (36.8 C)   Ht 6' (1.829 m)   Wt 265 lb (120.2 kg)   BMI 35.94 kg/m  Constitutional: Pleasant,well-developed, male in no acute distress. HEENT: Normocephalic and atraumatic. Conjunctivae are normal. No scleral icterus. Neck supple.  Cardiovascular: Normal rate, regular rhythm.  Pulmonary/chest: Effort normal and breath  sounds normal. No wheezing, rales or rhonchi. Abdominal: Soft, nondistended, nontender. .   Extremities: no edema Lymphadenopathy: No cervical adenopathy noted. Neurological: Alert and oriented to person place and time. Skin: Skin is warm and dry. No rashes noted. Psychiatric: Normal mood and affect. Behavior is normal.   ASSESSMENT AND PLAN: 68 year old male here for  reassessment of the following issues  Fatty liver - chronic elevation in liver enzymes with fatty liver noted on ultrasound and CT scan, no evidence of cirrhosis.  This is in the setting of routine alcohol use and diabetes, however he has had a predominant ALT greater than AST elevation which would be atypical for alcohol.  I discussed long-term risks of fatty liver with him to include cirrhosis and decompensated liver disease.  I recommend he abstain from his alcohol use, and we discussed this, however he is now willing to stop it but has minimized it.  He had labs done recently and will get a copy that to verify his last LFTs, platelet count, INR, hemoglobin etc.  He will continue to have this followed up periodically with [place].  He has had imaging this year which has not shown evidence of cirrhosis again, do not feel like we need to do repeat imaging right now, will reassess him in 6 months.  His vaccinations are up-to-date.  History of colon adenomas - 26 adenomas removed on his last 2 exams. Genetic testing without any clear cause although does have a variance of unknown significance in ATM gene. Will plan on surveillance colonoscopy 3 years from his last exam given no high risk lesions on this most recent colonoscopy.   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 I think we can take care of this at our center at this point. I discussed EGD with him to include risks / benefits, he wishes to proceed, further recommendations pending the results.   Hartwell Cellar, MD [facility]

## 2019-08-26 ENCOUNTER — Encounter: Payer: Self-pay | Admitting: Gastroenterology

## 2019-09-11 ENCOUNTER — Ambulatory Visit: Payer: Medicare Other | Admitting: Neurology

## 2019-09-14 IMAGING — CT CT LUMBAR SPINE WITHOUT CONTRAST
1 of 8 series · 5 of 14 positions shown, 7 images · non-contrast
Comparison: Lumbar MRI 01/22/2009

CLINICAL DATA: Difficulty walking.

EXAM:
CT LUMBAR SPINE WITHOUT CONTRAST
TECHNIQUE: Multidetector CT imaging of the lumbar spine was performed without
intravenous contrast administration. Multiplanar CT image
reconstructions were also generated.

[Series 3: l spine soft · axial · 0.38mm/px · z∈[-303,-114]mm · 5 of 95 slices shown, 7 images]
[im 16/95  soft-tissue]
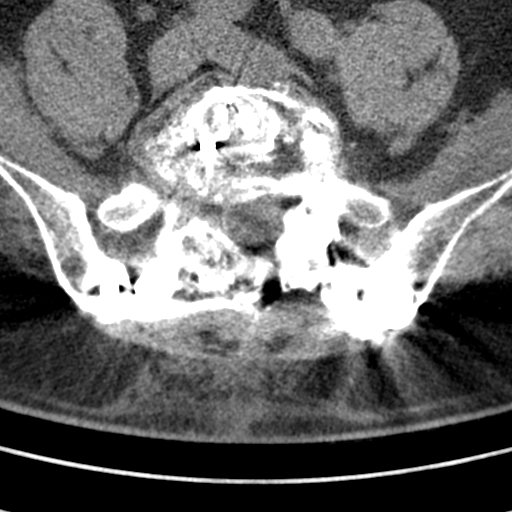
[im 16/95  bone]
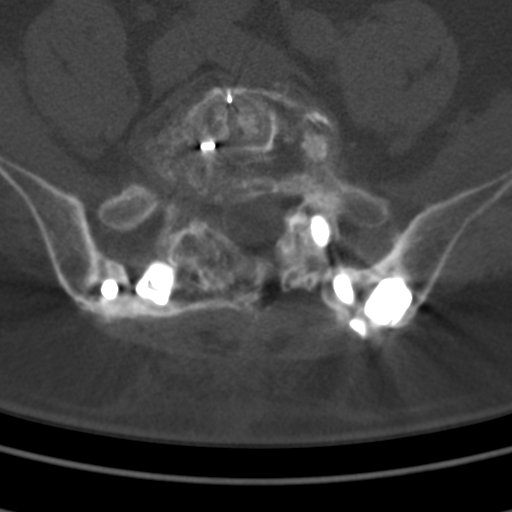
[im 32/95  bone]
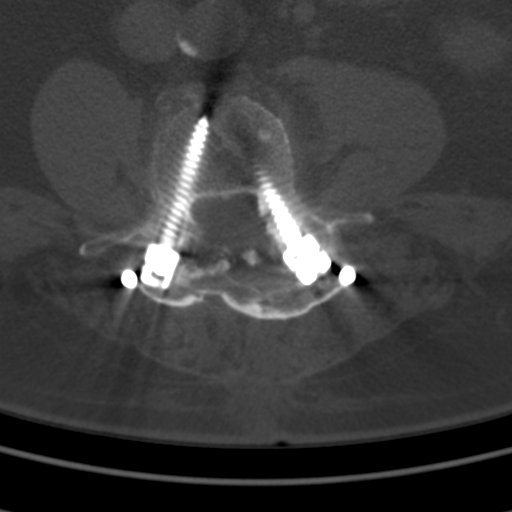
[im 48/95  bone]
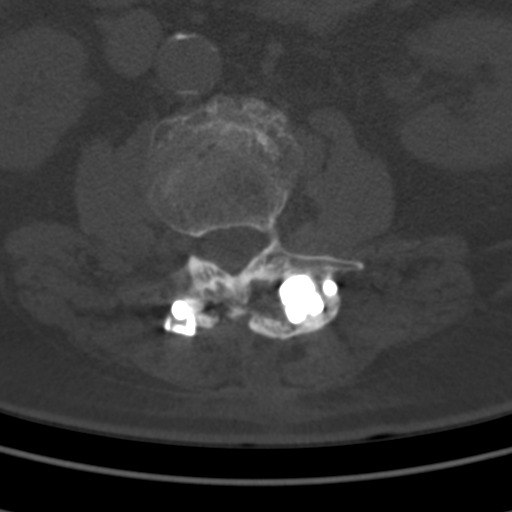
[im 63/95  bone]
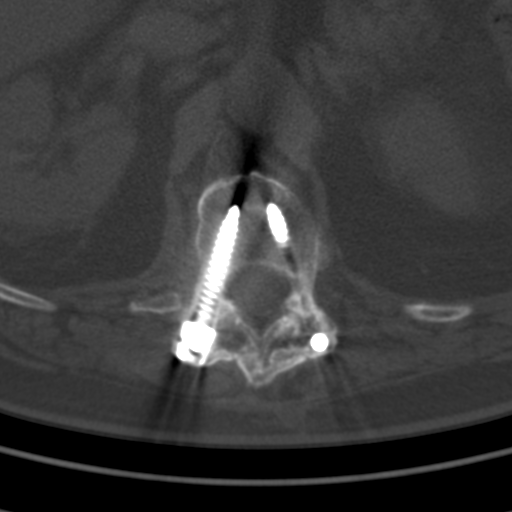
[im 79/95  soft-tissue]
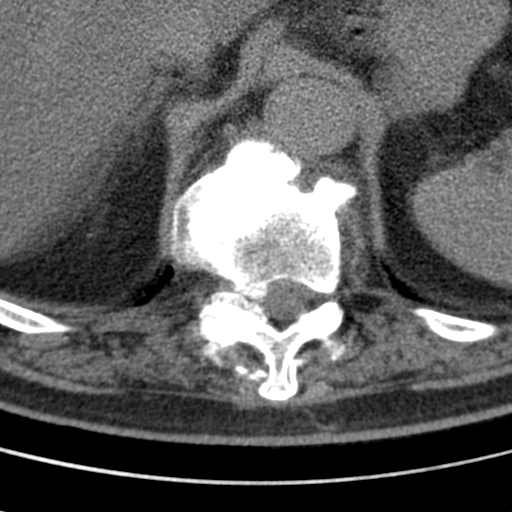
[im 79/95  bone]
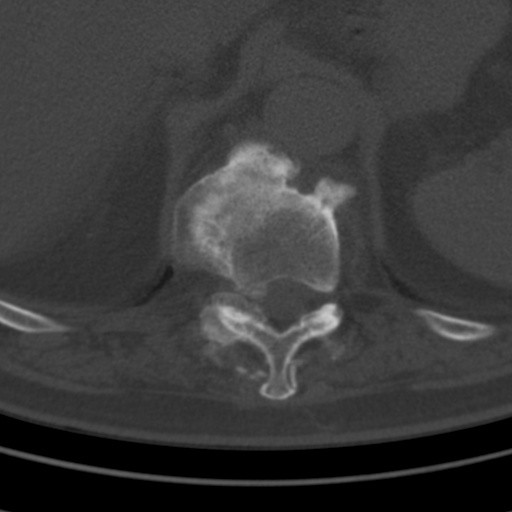

[5 of 14 positions shown; findings below may reference images not displayed]

FINDINGS: Segmentation: 5 lumbar type vertebrae

Alignment: Kyphosis centered at the lower thoracic spine.

Vertebrae: Posterior arthrodesis from T12-S1 which is solid despite
vacuum phenomenon seen within the L1-2 and to a lesser extent the
L2-3 disc spaces. No evidence of hardware failure or fracture. Rod
and pedicle screws continue to the level of the bilateral ilium.
There is ankylosis versus ankylosis at T11-12.

Paraspinal and other soft tissues: Left nephrolithiasis.
Atherosclerosis.

Disc levels: T10-11 severe disc degeneration with vacuum phenomenon
within a diffuse disc cleft. The disc is narrowed and there is
endplate ridging. Right more than left facet spurring with severe
right foraminal impingement. There is spinal stenosis with probable
cord mass effect.

T11-12 asymmetric right-sided spurring and advanced height loss
causes right foraminal impingement.

The other levels show no significant bony canal or foraminal
stenosis. There is ridging at the level of the L4-5 endplate, but
the canal appears sufficiently patent.
IMPRESSION: 1. T10-11 severe disc and right more than left facet degeneration
above a T11-S1 fusion. There is severe right foraminal impingement
and probable cord mass effect at this level.
2. T11-12 arthrodesis or ankylosis with severe right foraminal
impingement from spurring.

## 2019-10-23 ENCOUNTER — Ambulatory Visit (INDEPENDENT_AMBULATORY_CARE_PROVIDER_SITE_OTHER): Payer: Medicare Other | Admitting: Neurology

## 2019-10-23 ENCOUNTER — Other Ambulatory Visit: Payer: Self-pay

## 2019-10-23 ENCOUNTER — Encounter: Payer: Self-pay | Admitting: Neurology

## 2019-10-23 VITALS — BP 153/107 | HR 94 | Resp 18 | Ht 70.0 in | Wt 265.4 lb

## 2019-10-23 DIAGNOSIS — G621 Alcoholic polyneuropathy: Secondary | ICD-10-CM

## 2019-10-23 NOTE — Patient Instructions (Signed)
Continue to take daily folic acid, vitamin 123456, and vitamin B1 supplements  Try very hard to cut back on alcohol  Take extra caution walking in the dark and on uneven ground.  Use a cane/walker as needed  Check your feet daily

## 2019-10-23 NOTE — Progress Notes (Signed)
[facility] Clinic Note - Initial Visit   Date: 10/23/19  Robert Castro MRN: [id number] DOB: 1951/08/17   Dear Bing Matter, PA-C:  Thank you for your kind referral of Robert Castro for consultation of neuropathy. Although his history is well known to you, please allow [place] to reiterate it for the purpose of our medical record. The patient was accompanied to the clinic by self.   History of Present Illness: Robert Castro is a 69 y.o. right-handed male with hypertension, diabetes mellitus, GERD, lumbar spondylosis post laminectomy syndrome and chronic right foot drop, and alcohol use presenting for evaluation of neuropathy.   He has been evaluated by Dr. Star Age at [facility] and Dr. Ernst Bowler at [facility] for the same complaints.    For the past several years, he has noticed progressive numbness/tingling of the feet and lower legs.  Symptoms are constant without any exacerbating or alleviating factors.  He does not have severe pain in the feet, but does have chronic low back pain and is followed by a spine surgeon.  He has imbalance and is careful when walking.  He does not use gait assist device.  He falls frequently.  He is hoping to find a treatment plan to fix his neuropathy.  He has been diabetes for the past several years, with HbA1c ranging around 6.  For the past 40+ years, he has moderately heavy alcohol consumption.  Currently, he drinks about 3-4 mixed drinks/wine or 4-5 beers nightly, sometimes more.    Past Medical History:  Diagnosis Date  . Adenomatous duodenal polyp   . Arthritis   . Barrett's esophagus   . Benign enlargement of prostate   . Cataract    both eyes  . 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 Depression   . Diabetes ([place])   . Diverticulosis   . Fatty liver   . GERD (gastroesophageal reflux disease)   . HOH (hard of hearing)   . Hyperlipidemia   . Hypertension   . Hypothyroidism     . Internal hemorrhoids   . Leg edema, left   . Macular degeneration   . Neuromuscular disorder ([place])   . Osteoarthritis   . Sleep apnea    cpap  . Testosterone deficiency   . Tremors of nervous system   . Umbilical hernia     Past Surgical History:  Procedure Laterality Date  . BACK SURGERY  2009  . BACK SURGERY  09/2014   Dr Marykay Lex  . CATARACT EXTRACTION, BILATERAL  12/2016  . ESOPHAGOGASTRODUODENOSCOPY (EGD) WITH PROPOFOL N/A 09/16/2017   Procedure: ESOPHAGOGASTRODUODENOSCOPY (EGD) WITH PROPOFOL;  Surgeon: Yetta Flock, MD;  Location: [facility];  Service: Gastroenterology;  Laterality: N/A;  . LUMBAR LAMINECTOMY/ DECOMPRESSION WITH MET-RX  2014  . TONSILLECTOMY  1956  . UMBILICAL HERNIA REPAIR       Medications:  Outpatient Encounter Medications as of 10/23/2019  Medication Sig  . ALPHA LIPOIC ACID PO Take 400 mg by mouth daily.  [name] amLODipine (NORVASC) 10 MG tablet Take 10 mg Daily by mouth.   [name] anastrozole (ARIMIDEX) 1 MG tablet Take 1 mg daily by mouth.  [name] ascorbic acid (VITAMIN C) 500 MG tablet Take 1,000 mg by mouth daily.  [name] aspirin EC 81 MG tablet Take 81 mg by mouth daily.  [name] atorvastatin (LIPITOR) 10 MG tablet Take 10 mg by mouth daily.   [name] buPROPion (WELLBUTRIN XL) 300 MG 24  hr tablet Take 300 mg by mouth Daily.   . calcium-vitamin D (OSCAL WITH D) 250-125 MG-UNIT tablet Take 1 tablet by mouth daily.  . chlorhexidine (PERIDEX) 0.12 % solution Use as directed 10 mLs in the mouth or throat 2 (two) times daily as needed.   . Cholecalciferol (VITAMIN D-1000 MAX ST) 1000 units tablet Take 1,000 Units by mouth daily.   . diazepam (VALIUM) 5 MG tablet Take 5-10 mg by mouth every 6 (six) hours as needed (for spasms).   . diclofenac (VOLTAREN) 75 MG EC tablet Take 75 mg by mouth 2 (two) times daily as needed (for pain.).   [name] Dulaglutide (TRULICITY) 1.5 NO/6.7EH SOPN Inject into the skin.  . fluocinonide ointment (LIDEX) 2.09 % Apply 1 application topically 2 (two)  times daily as needed (IRRITATION).  . fluticasone (FLONASE) 50 MCG/ACT nasal spray Place 1 spray into both nostrils daily as needed for allergies or rhinitis.  [name] JARDIANCE 10 MG TABS tablet Take 10 mg by mouth daily.  . Lancets (ONETOUCH ULTRASOFT) lancets 1 each by Misc.(Non-Drug; Combo Route) route daily. Dx E11.9  . metFORMIN (GLUCOPHAGE-XR) 500 MG 24 hr tablet Take 1,000 mg by mouth 2 (two) times daily.  [name] MILK THISTLE PO Take 1 capsule by mouth daily.  . Multiple Vitamins-Minerals (PRESERVISION AREDS 2 PO) Take 1 capsule by mouth 2 (two) times daily.   . Multiple Vitamins-Minerals (PRESERVISION AREDS 2 PO) Take by mouth.  . olmesartan (BENICAR) 20 MG tablet Take 20 mg by mouth daily.  . Omega-3 Fatty Acids (FISH OIL) 1200 MG CAPS Take 1 capsule by mouth daily.   [name] omeprazole (PRILOSEC) 20 MG capsule Take 20 mg by mouth daily.  . sertraline (ZOLOFT) 100 MG tablet Take 200 mg by mouth daily.   [name] testosterone cypionate (DEPOTESTOTERONE CYPIONATE) 200 MG/ML injection Inject 200 mg into the muscle every 14 (fourteen) days.   . Thiamine HCl (B-1 PO) Take 300 mg by mouth daily.  [name] thyroid (ARMOUR) 60 MG tablet Take 60 mg by mouth daily.  . traMADol (ULTRAM) 50 MG tablet Take 50 mg by mouth every 6 (six) hours as needed (for pain.).   [name] TURMERIC PO Take 1,000 mg by mouth daily.   [name] zolpidem (AMBIEN) 10 MG tablet Take 10 mg by mouth daily as needed. For sleep   No facility-administered encounter medications on file as of 10/23/2019.    Allergies: No Known Allergies  Family History: Family History  Problem Relation Age of Onset  . Atrial fibrillation Brother   . Cancer Mother 68       abdominal cancer, unk name of cancer type  . Heart attack Maternal Grandfather   . Heart attack Paternal Grandfather   . Colon cancer Neg Hx   . Stomach cancer Neg Hx   . Colon polyps Neg Hx   . Esophageal cancer Neg Hx   . Rectal cancer Neg Hx     Social History: Social History   Tobacco Use  .  Smoking status: Former Smoker    Quit date: 08/25/2002    Years since quitting: 17.1  . Smokeless tobacco: Never Used  . Tobacco comment: in college only 40 years ago  Substance Use Topics  . Alcohol use: Yes    Alcohol/week: 14.0 - 28.0 standard drinks    Types: 14 - 28 Standard drinks or equivalent per week    Comment: varies  daily drinker  . Drug use: No   Social History   Social History Narrative  .  Not on file    Vital Signs:  BP (!) 153/107 (BP Location: Left Arm, Patient Position: Sitting)   Pulse 94   Resp 18   Ht 5' 10"  (1.778 m)   Wt 265 lb 6.4 oz (120.4 kg)   SpO2 95%   BMI 38.08 kg/m   Neurological Exam: MENTAL STATUS including orientation to time, place, person, recent and remote memory, attention span and concentration, language, and fund of knowledge is normal.  Speech is not dysarthric.  CRANIAL NERVES: II:  No visual field defects. III-IV-VI: Pupils equal round. Normal conjugate, extra-ocular eye movements in all directions of gaze.  No nystagmus.  No ptosis.    VIII:  Normal hearing and vestibular function.   IX-X:  Normal palatal movement.   XI:  Normal shoulder shrug and head rotation.    MOTOR:  No atrophy, fasciculations or abnormal movements.  No pronator drift.  Left great toe with toe fungus.   Upper Extremity:  Right  Left  Deltoid  5/5   5/5   Biceps  5/5   5/5   Triceps  5/5   5/5   Infraspinatus 5/5  5/5  Medial pectoralis 5/5  5/5  Wrist extensors  5/5   5/5   Wrist flexors  5/5   5/5   Finger extensors  5/5   5/5   Finger flexors  5/5   5/5   Dorsal interossei  5/5   5/5   Abductor pollicis  5/5   5/5   Tone (Ashworth scale)  0  0   Lower Extremity:  Right  Left  Hip flexors  5/5   5/5   Hip extensors  5/5   5/5   Adductor 5/5  5/5  Abductor 5/5  5/5  Knee flexors  5/5   5/5   Knee extensors  5/5   5/5   Dorsiflexors  5/5   5/5   Plantarflexors  5/5   5/5   Toe extensors  5/5   5/5   Toe flexors  5/5   5/5   Tone  (Ashworth scale)  0  0   MSRs:  Right        Left                  brachioradialis 2+  2+  biceps 2+  2+  triceps 2+  2+  patellar 2+  2+  ankle jerk 0  0  Hoffman no  no  plantar response down  down   SENSORY:  Vibration is absent distal to knees bilaterally, there is a gradient pattern of sensory loss to pin prick and vibration distal to mid-calf bilaterally.  Romberg's sign present.   COORDINATION/GAIT: Normal finger-to- nose-finger.  Intact rapid alternating movements bilaterally.  He is unable to rise from a chair without using arms.  Gait wide-based, antalgic, unassisted, stable.    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  I had a long discussion with patient regarding that nature of neuropathy, management options, and prognosis.  He has a 40+ year history of moderately heavy alcohol consumption which is the reason for his neuropathy.  Diabetes will also exacerbate neuropathy, but is a contributing factor, not underlying reason.   He was informed that there is no "fix" or "cure" for neuropathy.  Management is supportive and entails physical therapy for gait training and balance and medications for pain; fortunately, he does not have significant neuropathic pain related to his neuropathy, which is mostly  numbness.   It is critical that he try to reduce alcohol consumption, if not stop, to prevent progression of neuropathy.  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  Patient educated on daily foot inspection, fall prevention, and safety precautions around the home.  He had many questions which I answered to the best of my ability.   Greater than 50% of this 60 minute visit was spent in counseling, explanation of diagnosis, planning of further management, and coordination of care.   Thank you for allowing me to participate in patient's care.  If I can answer any  additional questions, I would be pleased to do so.    Sincerely,    Muna Demers K. Posey Pronto, DO

## 2019-10-27 ENCOUNTER — Telehealth: Payer: Self-pay | Admitting: Gastroenterology

## 2019-10-27 NOTE — Telephone Encounter (Signed)
Called patient back and he wanted to confirm when his next Colonoscopy was due. I told him Dr. Havery Moros recommended 03/2022.

## 2019-10-27 NOTE — Telephone Encounter (Signed)
Patient calling- patient has a question for Dr. Havery Moros- states it has to do with the colon/endo that he regularly has. States that the papers he got that say the date he needs them repeated is different than what he was originally told. He wants to talk to Dr. Havery Moros to clarify when he would need it again.

## 2019-10-27 NOTE — Telephone Encounter (Signed)
Closing encounter

## 2019-11-09 ENCOUNTER — Telehealth: Payer: Self-pay

## 2019-11-09 NOTE — Telephone Encounter (Signed)
Left message for patient to please come into our lab for 6 month LFTs

## 2019-12-18 ENCOUNTER — Other Ambulatory Visit (INDEPENDENT_AMBULATORY_CARE_PROVIDER_SITE_OTHER): Payer: Medicare Other

## 2019-12-18 DIAGNOSIS — K709 Alcoholic liver disease, unspecified: Secondary | ICD-10-CM

## 2019-12-18 DIAGNOSIS — R7989 Other specified abnormal findings of blood chemistry: Secondary | ICD-10-CM | POA: Diagnosis not present

## 2019-12-18 LAB — COMPREHENSIVE METABOLIC PANEL
ALT: 59 U/L — ABNORMAL HIGH (ref 0–53)
AST: 37 U/L (ref 0–37)
Albumin: 4.6 g/dL (ref 3.5–5.2)
Alkaline Phosphatase: 95 U/L (ref 39–117)
BUN: 23 mg/dL (ref 6–23)
CO2: 30 mEq/L (ref 19–32)
Calcium: 9.6 mg/dL (ref 8.4–10.5)
Chloride: 101 mEq/L (ref 96–112)
Creatinine, Ser: 1.2 mg/dL (ref 0.40–1.50)
GFR: 59.79 mL/min — ABNORMAL LOW (ref >60.00)
Glucose, Bld: 204 mg/dL — ABNORMAL HIGH (ref 70–99)
Potassium: 4.1 mEq/L (ref 3.5–5.1)
Sodium: 140 mEq/L (ref 135–145)
Total Bilirubin: 1 mg/dL (ref 0.2–1.2)
Total Protein: 6.7 g/dL (ref 6.0–8.3)

## 2019-12-18 LAB — CBC WITH DIFFERENTIAL/PLATELET
Basophils Absolute: 0 10*3/uL (ref 0.0–0.1)
Basophils Relative: 0.9 % (ref 0.0–3.0)
Eosinophils Absolute: 0.2 10*3/uL (ref 0.0–0.7)
Eosinophils Relative: 4.2 % (ref 0.0–5.0)
HCT: 51.6 % (ref 39.0–52.0)
Hemoglobin: 17.2 g/dL — ABNORMAL HIGH (ref 13.0–17.0)
Lymphocytes Relative: 34.7 % (ref 12.0–46.0)
Lymphs Abs: 1.6 10*3/uL (ref 0.7–4.0)
MCHC: 33.4 g/dL (ref 30.0–36.0)
MCV: 97.5 fl (ref 78.0–100.0)
Monocytes Absolute: 0.5 10*3/uL (ref 0.1–1.0)
Monocytes Relative: 11.5 % (ref 3.0–12.0)
Neutro Abs: 2.2 10*3/uL (ref 1.4–7.7)
Neutrophils Relative %: 48.7 % (ref 43.0–77.0)
Platelets: 132 10*3/uL — ABNORMAL LOW (ref 150.0–400.0)
RBC: 5.29 Mil/uL (ref 4.22–5.81)
RDW: 13.9 % (ref 11.5–15.5)
WBC: 4.5 10*3/uL (ref 4.0–10.5)

## 2019-12-18 LAB — HEPATIC FUNCTION PANEL
ALT: 59 U/L — ABNORMAL HIGH (ref 0–53)
AST: 37 U/L (ref 0–37)
Albumin: 4.6 g/dL (ref 3.5–5.2)
Alkaline Phosphatase: 95 U/L (ref 39–117)
Bilirubin, Direct: 0.3 mg/dL (ref 0.0–0.3)
Total Bilirubin: 1 mg/dL (ref 0.2–1.2)
Total Protein: 6.7 g/dL (ref 6.0–8.3)

## 2019-12-21 ENCOUNTER — Telehealth: Payer: Self-pay | Admitting: Gastroenterology

## 2019-12-21 ENCOUNTER — Other Ambulatory Visit: Payer: Self-pay

## 2019-12-21 DIAGNOSIS — K709 Alcoholic liver disease, unspecified: Secondary | ICD-10-CM

## 2019-12-21 DIAGNOSIS — D582 Other hemoglobinopathies: Secondary | ICD-10-CM

## 2019-12-21 NOTE — Telephone Encounter (Signed)
Patient is calling- states that he had blood work drawn and is asking if the results are back. He states that he may not answer when you call back but if you will leave a detailed message if he does not.

## 2019-12-21 NOTE — Telephone Encounter (Signed)
See lab results for additional questions or concerns.

## 2020-01-06 ENCOUNTER — Other Ambulatory Visit: Payer: Self-pay

## 2020-01-06 ENCOUNTER — Encounter: Payer: Medicare Other | Attending: Internal Medicine | Admitting: Internal Medicine

## 2020-01-06 DIAGNOSIS — I10 Essential (primary) hypertension: Secondary | ICD-10-CM | POA: Diagnosis not present

## 2020-01-06 DIAGNOSIS — E11621 Type 2 diabetes mellitus with foot ulcer: Secondary | ICD-10-CM | POA: Insufficient documentation

## 2020-01-06 DIAGNOSIS — G4730 Sleep apnea, unspecified: Secondary | ICD-10-CM | POA: Diagnosis not present

## 2020-01-06 DIAGNOSIS — E1142 Type 2 diabetes mellitus with diabetic polyneuropathy: Secondary | ICD-10-CM | POA: Insufficient documentation

## 2020-01-06 DIAGNOSIS — F329 Major depressive disorder, single episode, unspecified: Secondary | ICD-10-CM | POA: Diagnosis not present

## 2020-01-06 DIAGNOSIS — E785 Hyperlipidemia, unspecified: Secondary | ICD-10-CM | POA: Diagnosis not present

## 2020-01-06 DIAGNOSIS — L97512 Non-pressure chronic ulcer of other part of right foot with fat layer exposed: Secondary | ICD-10-CM | POA: Diagnosis not present

## 2020-01-06 DIAGNOSIS — E039 Hypothyroidism, unspecified: Secondary | ICD-10-CM | POA: Diagnosis not present

## 2020-01-07 NOTE — Progress Notes (Signed)
LAYTON, PODOLSKI ([id number]) Visit Report for 01/06/2020 Abuse/Suicide Risk Screen Details Patient Name: Robert Castro Date of Service: 01/06/2020 2:15 PM Medical Record Number: [id number] Patient Account Number: [id number] Date of Birth/Sex: Jul 06, 1951 (68 y.o. M) Treating RN: Army Melia Primary Care Tanner Vigna: Bing Matter Other Clinician: Referring Renda Pohlman: Referral, Self Treating Kehinde Totzke/Extender: Tito Dine in Treatment: 0 Abuse/Suicide Risk Screen Items Answer ABUSE RISK SCREEN: Has anyone close to you tried to hurt or harm you recentlyo No Do you feel uncomfortable with anyone in your familyo No Has anyone forced you do things that you didnot want to doo No Electronic Signature(s) Signed: 01/07/2020 11:19:24 AM By: Army Melia Entered By: Army Melia on 01/06/2020 14:42:45 Salazar, Norva Riffle ([id number]) -------------------------------------------------------------------------------- Activities of Daily Living Details Patient Name: Robert Castro Date of Service: 01/06/2020 2:15 PM Medical Record Number: [id number] Patient Account Number: [id number] Date of Birth/Sex: 05/30/1951 (68 y.o. M) Treating RN: Army Melia Primary Care Whitleigh Garramone: Bing Matter Other Clinician: Referring Woodard Perrell: Referral, Self Treating Tityana Pagan/Extender: Tito Dine in Treatment: 0 Activities of Daily Living Items Answer Activities of Daily Living (Please select one for each item) Drive Automobile Completely Able Take Medications Completely Able Use Telephone Completely Able Care for Appearance Completely Able Use Toilet Completely Able Bath / Shower Completely Able Dress Self Completely Able Feed Self Completely Able Walk Completely Able Get In / Out Bed Completely Able Housework Completely Able Prepare Meals Completely [place] for Self Completely Able Electronic Signature(s) Signed: 01/07/2020 11:19:24 AM By: Army Melia Entered By: Army Melia on 01/06/2020 14:42:56 Hendry, Norva Riffle ([id number]) -------------------------------------------------------------------------------- Education Screening Details Patient Name: Robert Castro Date of Service: 01/06/2020 2:15 PM Medical Record Number: [id number] Patient Account Number: [id number] Date of Birth/Sex: August 07, 1951 (68 y.o. M) Treating RN: Army Melia Primary Care Kaylum Shrum: Bing Matter Other Clinician: Referring Bravlio Luca: Referral, Self Treating Laurinda Carreno/Extender: Tito Dine in Treatment: 0 Primary Learner Assessed: Patient Learning Preferences/Education Level/Primary Language Learning Preference: Explanation Highest Education Level: College or Above Preferred Language: English Cognitive Barrier Language Barrier: No Translator Needed: No Memory Deficit: No Emotional Barrier: No Cultural/Religious Beliefs Affecting Medical Care: No Physical Barrier Impaired Vision: No Impaired Hearing: No Decreased Hand dexterity: No Knowledge/Comprehension Knowledge Level: High Comprehension Level: High Ability to understand written instructions: High Ability to understand verbal instructions: High Motivation Anxiety Level: Calm Cooperation: Cooperative Education Importance: Acknowledges Need Interest in Health Problems: Asks Questions Perception: Coherent Willingness to Engage in Self-Management High Activities: Readiness to Engage in Self-Management High Activities: Electronic Signature(s) Signed: 01/07/2020 11:19:24 AM By: Army Melia Entered By: Army Melia on 01/06/2020 14:43:13 Tischer, Norva Riffle ([id number]) -------------------------------------------------------------------------------- Fall Risk Assessment Details Patient Name: Robert Castro Date of Service: 01/06/2020 2:15 PM Medical Record Number: [id number] Patient Account Number: [id number] Date of Birth/Sex: 23-Aug-1951 (68 y.o. M) Treating RN: Army Melia Primary Care Tida Saner: Bing Matter Other Clinician: Referring Jaxan Michel: Referral, Self Treating Tyrice Hewitt/Extender: Tito Dine in Treatment: 0 Fall Risk Assessment Items Have you had 2 or more falls in the last 12 monthso 0 No Have you had any fall that resulted in injury in the last 12 monthso 0 No FALLS RISK SCREEN History of falling - immediate or within 3 months 0 No Secondary diagnosis (Do you have 2 or more medical diagnoseso) 0 No Ambulatory aid None/bed rest/wheelchair/nurse 0 No Crutches/cane/walker 0 No Furniture 0 No Intravenous therapy Access/Saline/Heparin Lock 0 No Gait/Transferring Normal/ bed rest/ wheelchair 0 No Weak (short steps  with or without shuffle, stooped but able to lift head while walking, may 0 No seek support from furniture) Impaired (short steps with shuffle, may have difficulty arising from chair, head down, impaired 0 No balance) Mental Status Oriented to own ability 0 No Electronic Signature(s) Signed: 01/07/2020 11:19:24 AM By: Army Melia Entered By: Army Melia on 01/06/2020 14:43:18 Longo, Norva Riffle ([id number]) -------------------------------------------------------------------------------- Foot Assessment Details Patient Name: Robert Castro Date of Service: 01/06/2020 2:15 PM Medical Record Number: [id number] Patient Account Number: [id number] Date of Birth/Sex: [birth year] (68 y.o. M) Treating RN: Army Melia Primary Care Dawid Dupriest: Bing Matter Other Clinician: Referring Harlie Ragle: Referral, Self Treating Lari Linson/Extender: Tito Dine in Treatment: 0 Foot Assessment Items Site Locations + = Sensation present, - = Sensation absent, C = Callus, U = Ulcer R = Redness, W = Warmth, M = Maceration, PU = Pre-ulcerative lesion F = Fissure, S = Swelling, D = Dryness Assessment Right: Left: Other Deformity: No No Prior Foot Ulcer: No No Prior Amputation: No No Charcot Joint: No No Ambulatory  Status: Ambulatory With Help Assistance Device: Cane Gait: Steady Electronic Signature(s) Signed: 01/07/2020 11:19:24 AM By: Army Melia Entered By: Army Melia on 01/06/2020 14:44:52 Talamante, Norva Riffle ([id number]) -------------------------------------------------------------------------------- Nutrition Risk Screening Details Patient Name: Robert Castro Date of Service: 01/06/2020 2:15 PM Medical Record Number: [id number] Patient Account Number: [id number] Date of Birth/Sex: Feb 07, 1951 (68 y.o. M) Treating RN: Army Melia Primary Care Rifka Ramey: Bing Matter Other Clinician: Referring Karisa Nesser: Referral, Self Treating Dolph Tavano/Extender: Tito Dine in Treatment: 0 Height (in): 71 Weight (lbs): 245 Body Mass Index (BMI): 34.2 Nutrition Risk Screening Items Score Screening NUTRITION RISK SCREEN: I have an illness or condition that made me change the kind and/or amount of food I eat 0 No I eat fewer than two meals per day 0 No I eat few fruits and vegetables, or milk products 0 No I have three or more drinks of beer, liquor or wine almost every day 0 No I have tooth or mouth problems that make it hard for me to eat 0 No I don't always have enough money to buy the food I need 0 No I eat alone most of the time 0 No I take three or more different prescribed or over-the-counter drugs a day 0 No Without wanting to, I have lost or gained 10 pounds in the last six months 0 No I am not always physically able to shop, cook and/or feed myself 0 No Nutrition Protocols Good Risk Protocol 0 No interventions needed Moderate Risk Protocol High Risk Proctocol Risk Level: Good Risk Score: 0 Electronic Signature(s) Signed: 01/07/2020 11:19:24 AM By: Army Melia Entered By: Army Melia on 01/06/2020 14:43:25

## 2020-01-13 ENCOUNTER — Telehealth: Payer: Self-pay

## 2020-01-13 ENCOUNTER — Ambulatory Visit: Payer: Medicare Other | Admitting: Internal Medicine

## 2020-01-13 NOTE — Telephone Encounter (Signed)
LM for pt to go to the lab for CBC

## 2020-01-13 NOTE — Telephone Encounter (Signed)
Pt stated that he just had blood work done at [facility] lab within the past few weeks.

## 2020-01-13 NOTE — Telephone Encounter (Signed)
Called and spoke to pt.  Reminded him that Dr. Loni Muse wanted to recheck his hemoglobin from the labs he had done on 4-23 since it was slightly high.  I reminded him that we spoke and scheduled him for an appt in June.  He expressed that he will go to the lab this week or next for CBC.

## 2020-01-13 NOTE — Telephone Encounter (Signed)
-----   Message from Roetta Sessions, [place] sent at 12/21/2019 10:04 AM EDT ----- Regarding: pt due for CBC Remind pt to go to the lab for CBC

## 2020-01-14 ENCOUNTER — Encounter: Payer: Medicare Other | Admitting: Internal Medicine

## 2020-01-14 ENCOUNTER — Other Ambulatory Visit: Payer: Self-pay

## 2020-01-14 DIAGNOSIS — E11621 Type 2 diabetes mellitus with foot ulcer: Secondary | ICD-10-CM | POA: Diagnosis not present

## 2020-01-15 NOTE — Progress Notes (Signed)
Robert Castro ([id number]) Visit Report for 01/14/2020 HPI Details Patient Name: Robert Castro, Robert Castro Date of Service: 01/14/2020 1:30 PM Medical Record Number: [id number] Patient Account Number: [id number] Date of Birth/Sex: 1951/05/17 (69 y.o. M) Treating RN: Army Melia Primary Care Provider: Bing Matter Other Clinician: Referring Provider: Bing Matter Treating Provider/Extender: Tito Dine in Treatment: 1 History of Present Illness HPI Description: ADMISSION 01/06/2020 Patient is a 69 year old man with multiple medical problems. Among them he is type 2 diabetes with peripheral neuropathy. He is here for review of an area on the right first plantar toe over the proximal phalanx. He says he got this about 3 weeks ago when he was stepping out of the hot tub he stepped on a rock. He says he did not even feel anything was wrong. He went to see podiatry and he was prescribed Santyl although he has not yet started this. He was using mupirocin prior to this after seeing his primary doctor on 4/26. 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 Still clean on the plantar right great toe we use silver collagen Electronic Signature(s) Signed: 01/14/2020 5:46:06 PM By: Linton Ham  MD Entered By: Linton Ham on 01/14/2020 15:17:00 Sheldon, Robert Castro ([id number]) -------------------------------------------------------------------------------- Physical Exam Details Patient Name: Robert Castro Date of Service: 01/14/2020 1:30 PM Medical Record Number: [id number] Patient Account Number: [id number] Date of Birth/Sex: 23-Jan-1951 (69 y.o. M) Treating RN: Army Melia Primary Care Provider: Bing Matter Other Clinician: Referring Provider: Bing Matter Treating Provider/Extender: Tito Dine in Treatment: 1 Cardiovascular Fetal pulses are palpable. Notes Plantar right great toe wound. Slightly smaller. Surface looks healthy even under illumination. Electronic Signature(s) Signed: 01/14/2020 5:46:06 PM By: Linton Ham MD Entered By: Linton Ham on 01/14/2020 15:17:45 Tiso, Robert Castro ([id number]) -------------------------------------------------------------------------------- Physician Orders Details Patient Name: Robert Castro Date of Service: 01/14/2020 1:30 PM Medical Record Number: [id number] Patient Account Number: [id number] Date of Birth/Sex: 1951/02/09 (69 y.o. M) Treating RN: Army Melia Primary Care Provider: Bing Matter Other Clinician: Referring Provider: Bing Matter Treating Provider/Extender: Tito Dine in Treatment: 1 Verbal / Phone Orders: No Diagnosis Coding Wound Cleansing Wound #1 Right,Plantar Toe Great o Clean wound with Normal Saline. Anesthetic (add to Medication List) Wound #1 Right,Plantar Toe Great o Topical Lidocaine 4% cream applied to wound bed prior to debridement (In Clinic Only). Primary Wound Dressing Wound #1 Right,Plantar Toe Great o Silver Collagen Secondary Dressing Wound #1 Right,Plantar Toe Great o Gauze and Kerlix/Conform o Other - offloading felt Dressing Change Frequency Wound #1 Right,Plantar Toe Great o Change Dressing Monday, Wednesday,  Friday Follow-up Appointments Wound #1 Right,Plantar Toe Great o Return Appointment in 1 week. Off-Loading Wound #1 Right,Plantar Toe Great o Open toe surgical shoe - Right Additional Orders / Instructions   o Activity as tolerated Electronic Signature(s) Signed: 01/14/2020 5:18:09 PM By: Army Melia Signed: 01/14/2020 5:46:06 PM By: Linton Ham MD Entered By: Army Melia on 01/14/2020 14:50:04 Mccluskey, Robert Castro ([id number]) -------------------------------------------------------------------------------- Problem List Details Patient Name: Robert Castro Date of Service: 01/14/2020 1:30 PM Medical Record Number: [id number] Patient Account Number: [id number] Date of Birth/Sex: 06/18/1951 (69 y.o. M) Treating RN: Army Melia Primary Care Provider: Bing Matter Other Clinician: Referring Provider: Bing Matter Treating Provider/Extender: Tito Dine in Treatment: 1 Active Problems ICD-10 Encounter Code Description Active Date MDM Diagnosis E11.621 Type 2 diabetes mellitus with foot ulcer 01/06/2020 No Yes L97.512 Non-pressure chronic ulcer of other part of right foot with fat layer 01/06/2020 No Yes exposed E11.42 Type 2 diabetes mellitus with diabetic polyneuropathy 01/06/2020 No Yes Inactive Problems Resolved Problems Electronic Signature(s) Signed: 01/14/2020 5:46:06 PM By: Linton Ham MD Entered By: Linton Ham on 01/14/2020 15:14:51 Coor, Robert Castro ([id number]) -------------------------------------------------------------------------------- Progress Note Details Patient Name: Robert Castro Date of Service: 01/14/2020 1:30 PM Medical Record Number: [id number] Patient Account Number: [id number] Date of Birth/Sex: 1951-02-14 (69 y.o. M) Treating RN: Army Melia Primary Care Provider: Bing Matter Other Clinician: Referring Provider: Bing Matter Treating Provider/Extender: Tito Dine in Treatment:  1 Subjective History of Present Illness (HPI) ADMISSION 01/06/2020 Patient is a 69 year old man with multiple medical problems. Among them he is type 2 diabetes with peripheral neuropathy. He is here for review of an area on the right first plantar toe over the proximal phalanx. He says he got this about 3 weeks ago when he was stepping out of the hot tub he stepped on a rock. He says he did not even feel anything was wrong. He went to see podiatry and he was prescribed Santyl although he has not yet started this. He was using mupirocin prior to this after seeing his primary doctor on 4/26. 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 Still clean on the plantar right great toe we use silver collagen Objective Constitutional Vitals Time Taken: 1:50 PM, Height: 71 in, Weight: 245 lbs, BMI: 34.2, Temperature: 97.7 F, Pulse: 97 bpm, Respiratory Rate: 18 breaths/min, Blood Pressure: 156/88 mmHg. Cardiovascular Fetal pulses are palpable. General Notes: Plantar right great toe wound. Slightly smaller. Surface looks healthy even under illumination. Robert Castro ([id number]) Integumentary (Hair, Skin) Wound #1 status is Open. Original cause of wound was Gradually Appeared. The wound is located on the [employer]. The wound measures 0.7cm length x 0.5cm width x 0.1cm depth; 0.275cm^2 area and  0.027cm^3 volume. There is Fat Layer (Subcutaneous Tissue) Exposed exposed. There is no tunneling or undermining noted. There is a medium amount of serosanguineous drainage noted. The wound margin is flat and intact. There is large (67-100%) red granulation within the wound bed. There is a small (1-33%) amount of necrotic tissue within the wound bed including Adherent Slough. Assessment Active Problems ICD-10 Type 2 diabetes mellitus with foot ulcer Non-pressure chronic ulcer of other part of right foot with fat layer exposed Type 2 diabetes mellitus with diabetic polyneuropathy Plan Wound  Cleansing: Wound #1 Right,Plantar Toe Great: Clean wound with Normal Saline. Anesthetic (add to Medication List): Wound #1 Right,Plantar Toe Great: Topical Lidocaine 4% cream applied to wound bed prior to debridement (In Clinic Only). Primary Wound Dressing: Wound #1 Right,Plantar Toe Great: Silver Collagen Secondary Dressing: Wound #1 Right,Plantar Toe Great: Gauze and Kerlix/Conform Other - offloading felt Dressing Change Frequency: Wound #1 Right,Plantar Toe Great: Change Dressing Monday, Wednesday, Friday Follow-up Appointments: Wound #1 Right,Plantar Toe Great: Return Appointment in 1 week. Off-Loading: Wound #1 Right,Plantar Toe Great: Open toe surgical shoe - Right Additional Orders / Instructions: Activity as tolerated 1. The wound is smaller. Not as dry as it was last week continue to use moistened silver collagen 2. Careful attention to the wound surface area this next week to determine whether we are making continued progress. 3. He showed me the thick raised callus on his left toe again. Wanted me to remove some of it. I referred him to podiatry Electronic Signature(s) Signed: 01/14/2020 5:46:06 PM By: Linton Ham MD Entered By: Linton Ham on 01/14/2020 15:18:39 Reha, Robert Castro  ([id number]) -------------------------------------------------------------------------------- Bath Details Patient Name: Robert Castro Date of Service: 01/14/2020 Medical Record Number: [id number] Patient Account Number: [id number] Date of Birth/Sex: April 29, 1951 (69 y.o. M) Treating RN: Army Melia Primary Care Provider: Bing Matter Other Clinician: Referring Provider: Bing Matter Treating Provider/Extender: Tito Dine in Treatment: 1 Diagnosis Coding ICD-10 Codes Code Description E11.621 Type 2 diabetes mellitus with foot ulcer L97.512 Non-pressure chronic ulcer of other part of right foot with fat layer exposed E11.42 Type 2 diabetes mellitus with diabetic polyneuropathy Facility Procedures CPT4 Code: [id number] Description: 99213 - WOUND CARE VISIT-LEV 3 EST PT Modifier: Quantity: 1 Physician Procedures CPT4 Code: [id number] Description: 99213 - WC PHYS LEVEL 3 - EST PT Modifier: Quantity: 1 CPT4 Code: Description: ICD-10 Diagnosis Description E11.621 Type 2 diabetes mellitus with foot ulcer L97.512 Non-pressure chronic ulcer of other part of right foot with fat layer e E11.42 Type 2 diabetes mellitus with diabetic polyneuropathy Modifier: xposed Quantity: Electronic Signature(s) Signed: 01/14/2020 5:46:06 PM By: Linton Ham MD Entered By: Linton Ham on 01/14/2020 15:19:12

## 2020-01-15 NOTE — Progress Notes (Signed)
Robert Castro, Robert Castro ([id number]) Visit Report for 01/14/2020 Arrival Information Details Patient Name: Robert Castro, Robert Castro Date of Service: 01/14/2020 1:30 PM Medical Record Number: [id number] Patient Account Number: [id number] Date of Birth/Sex: 1950/11/11 (68 y.o. M) Treating RN: Army Melia Primary Care Piers Baade: Bing Matter Other Clinician: Referring Aairah Negrette: Bing Matter Treating Samatha Anspach/Extender: Tito Dine in Treatment: 1 Visit Information History Since Last Visit Added or deleted any medications: No Patient Arrived: Ambulatory Any new allergies or adverse reactions: No Arrival Time: 13:52 Had a fall or experienced change in No Accompanied By: self activities of daily living that may affect Transfer Assistance: None risk of falls: Patient Identification Verified: Yes Signs or symptoms of abuse/neglect since last visito No Secondary Verification Process Completed: Yes Hospitalized since last visit: No Implantable device outside of the clinic excluding No cellular tissue based products placed in the center since last visit: Has Dressing in Place as Prescribed: Yes Pain Present Now: No Electronic Signature(s) Signed: 01/14/2020 5:33:40 PM By: Lorine Bears RCP, RRT, CHT Entered By: Becky Sax, Amado Nash on 01/14/2020 13:52:51 [facility], Robert Castro ([id number]) -------------------------------------------------------------------------------- Clinic Level of Care Assessment Details Patient Name: Robert Castro Date of Service: 01/14/2020 1:30 PM Medical Record Number: [id number] Patient Account Number: [id number] Date of Birth/Sex: [birth year] (68 y.o. M) Treating RN: Army Melia Primary Care Ashkan Chamberland: Bing Matter Other Clinician: Referring Betzalel Umbarger: Bing Matter Treating Lexis Potenza/Extender: Tito Dine in Treatment: 1 Clinic Level of Care Assessment Items TOOL 4 Quantity Score []  - Use when only an EandM is performed on  FOLLOW-UP visit 0 ASSESSMENTS - Nursing Assessment / Reassessment X - Reassessment of Co-morbidities (includes updates in patient status) 1 10 X- 1 5 Reassessment of Adherence to Treatment Plan ASSESSMENTS - Wound and Skin Assessment / Reassessment X - Simple Wound Assessment / Reassessment - one wound 1 5 []  - 0 Complex Wound Assessment / Reassessment - multiple wounds []  - 0 Dermatologic / Skin Assessment (not related to wound area) ASSESSMENTS - Focused Assessment []  - Circumferential Edema Measurements - multi extremities 0 []  - 0 Nutritional Assessment / Counseling / Intervention []  - 0 Lower Extremity Assessment (monofilament, tuning fork, pulses) []  - 0 Peripheral Arterial Disease Assessment (using hand held doppler) ASSESSMENTS - Ostomy and/or Continence Assessment and Care []  - Incontinence Assessment and Management 0 []  - 0 Ostomy Care Assessment and Management (repouching, etc.) PROCESS - Coordination of Care X - Simple Patient / Family Education for ongoing care 1 15 []  - 0 Complex (extensive) Patient / Family Education for ongoing care []  - 0 Staff obtains Programmer, systems, Records, Test Results / Process Orders []  - 0 Staff telephones HHA, Nursing Homes / Clarify orders / etc []  - 0 Routine Transfer to another Facility (non-emergent condition) []  - 0 Routine Hospital Admission (non-emergent condition) []  - 0 New Admissions / Biomedical engineer / Ordering NPWT, Apligraf, etc. []  - 0 Emergency Hospital Admission (emergent condition) X- 1 10 Simple Discharge Coordination []  - 0 Complex (extensive) Discharge Coordination PROCESS - Special Needs []  - Pediatric / Minor Patient Management 0 []  - 0 Isolation Patient Management []  - 0 Hearing / Language / Visual special needs []  - 0 Assessment of Community assistance (transportation, D/C planning, etc.) []  - 0 Additional assistance / Altered mentation []  - 0 Support Surface(s) Assessment (bed, cushion, seat,  etc.) INTERVENTIONS - Wound Cleansing / Measurement Kohlbeck, Zylon S. ([id number]) X- 1 5 Simple Wound Cleansing - one wound []  - 0 Complex Wound Cleansing - multiple wounds X-  1 5 Wound Imaging (photographs - any number of wounds) []  - 0 Wound Tracing (instead of photographs) X- 1 5 Simple Wound Measurement - one wound []  - 0 Complex Wound Measurement - multiple wounds INTERVENTIONS - Wound Dressings []  - Small Wound Dressing one or multiple wounds 0 X- 1 15 Medium Wound Dressing one or multiple wounds []  - 0 Large Wound Dressing one or multiple wounds []  - 0 Application of Medications - topical []  - 0 Application of Medications - injection INTERVENTIONS - Miscellaneous []  - External ear exam 0 []  - 0 Specimen Collection (cultures, biopsies, blood, body fluids, etc.) []  - 0 Specimen(s) / Culture(s) sent or taken to Lab for analysis []  - 0 Patient Transfer (multiple staff / Civil Service fast streamer / Similar devices) []  - 0 Simple Staple / Suture removal (25 or less) []  - 0 Complex Staple / Suture removal (26 or more) []  - 0 Hypo / Hyperglycemic Management (close monitor of Blood Glucose) []  - 0 Ankle / Brachial Index (ABI) - do not check if billed separately X- 1 5 Vital Signs Has the patient been seen at the hospital within the last three years: Yes Total Score: 80 Level Of Care: New/Established - Level 3 Electronic Signature(s) Signed: 01/14/2020 5:18:09 PM By: Army Melia Entered By: Army Melia on 01/14/2020 14:50:44 Robert Castro, Robert Castro ([id number]) -------------------------------------------------------------------------------- Encounter Discharge Information Details Patient Name: Robert Castro Date of Service: 01/14/2020 1:30 PM Medical Record Number: [id number] Patient Account Number: [id number] Date of Birth/Sex: 1951-03-30 (68 y.o. M) Treating RN: Army Melia Primary Care Chery Giusto: Bing Matter Other Clinician: Referring Naba Sneed: Bing Matter Treating  Sabreen Kitchen/Extender: Tito Dine in Treatment: 1 Encounter Discharge Information Items Discharge Condition: Stable Ambulatory Status: Ambulatory Discharge Destination: Home Transportation: Private Auto Accompanied By: self Schedule Follow-up Appointment: Yes Clinical Summary of Care: Electronic Signature(s) Signed: 01/14/2020 5:18:09 PM By: Army Melia Entered By: Army Melia on 01/14/2020 14:51:24 Robert Castro, Robert Castro ([id number]) -------------------------------------------------------------------------------- Lower Extremity Assessment Details Patient Name: Robert Castro Date of Service: 01/14/2020 1:30 PM Medical Record Number: [id number] Patient Account Number: [id number] Date of Birth/Sex: [birth year] (68 y.o. M) Treating RN: Montey Hora Primary Care Antonela Freiman: Bing Matter Other Clinician: Referring Dwayne Bulkley: Bing Matter Treating Adir Schicker/Extender: Ricard Dillon Weeks in Treatment: 1 Edema Assessment Assessed: [Left: No] [Right: No] Edema: [Left: N] [Right: o] Vascular Assessment Pulses: Dorsalis Pedis Palpable: [Right:Yes] Electronic Signature(s) Signed: 01/14/2020 5:37:23 PM By: Montey Hora Entered By: Montey Hora on 01/14/2020 13:59:09 Robert Castro, Robert Castro ([id number]) -------------------------------------------------------------------------------- Multi Wound Chart Details Patient Name: Robert Castro Date of Service: 01/14/2020 1:30 PM Medical Record Number: [id number] Patient Account Number: [id number] Date of Birth/Sex: 06/02/1951 (68 y.o. M) Treating RN: Army Melia Primary Care Laker Thompson: Bing Matter Other Clinician: Referring Biran Mayberry: Bing Matter Treating Kaydin Karbowski/Extender: Tito Dine in Treatment: 1 Vital Signs Height(in): 71 Pulse(bpm): 97 Weight(lbs): 245 Blood Pressure(mmHg): 156/88 Body Mass Index(BMI): 34 Temperature(F): 97.7 Respiratory Rate(breaths/min): 18 Photos: [N/A:N/A] Wound Location:  Right, Plantar Toe Great N/A N/A Wounding Event: Gradually Appeared N/A N/A Primary Etiology: Diabetic Wound/Ulcer of the Lower N/A N/A Extremity Comorbid History: Hypertension, Type II Diabetes, N/A N/A Osteoarthritis Date Acquired: 12/26/2019 N/A N/A Weeks of Treatment: 1 N/A N/A Wound Status: Open N/A N/A Measurements L x W x D (cm) 0.7x0.5x0.1 N/A N/A Area (cm) : 0.275 N/A N/A Volume (cm) : 0.027 N/A N/A % Reduction in Area: 30.00% N/A N/A % Reduction in Volume: 30.80% N/A N/A Classification: Grade 2 N/A N/A Exudate Amount: Medium N/A N/A  Exudate Type: Serosanguineous N/A N/A Exudate Color: red, brown N/A N/A Wound Margin: Flat and Intact N/A N/A Granulation Amount: Large (67-100%) N/A N/A Granulation Quality: Red N/A N/A Necrotic Amount: Small (1-33%) N/A N/A Exposed Structures: Fat Layer (Subcutaneous Tissue) N/A N/A Exposed: Yes Fascia: No Tendon: No Muscle: No Joint: No Bone: No Epithelialization: None N/A N/A Treatment Notes Wound #1 (Right, Plantar Toe Great) Notes prisma, conform AYAD, FALLONE ([id number]) Electronic Signature(s) Signed: 01/14/2020 5:46:06 PM By: Linton Ham MD Entered By: Linton Ham on 01/14/2020 15:15:01 Robert Castro, Robert Castro ([id number]) -------------------------------------------------------------------------------- [place] Details Patient Name: Robert Castro Date of Service: 01/14/2020 1:30 PM Medical Record Number: [id number] Patient Account Number: [id number] Date of Birth/Sex: [birth year] (68 y.o. M) Treating RN: Army Melia Primary Care Demichael Traum: Bing Matter Other Clinician: Referring Delta Deshmukh: Bing Matter Treating Elinore Shults/Extender: Tito Dine in Treatment: 1 Active Inactive Necrotic Tissue Nursing Diagnoses: Impaired tissue integrity related to necrotic/devitalized tissue Goals: Necrotic/devitalized tissue will be minimized in the wound bed Date Initiated: 01/06/2020 Target  Resolution Date: 01/20/2020 Goal Status: Active Interventions: Assess patient pain level pre-, during and post procedure and prior to discharge Treatment Activities: Apply topical anesthetic as ordered : 01/06/2020 Notes: Orientation to the Wound Care Program Nursing Diagnoses: Knowledge deficit related to the wound healing center program Goals: Patient/caregiver will verbalize understanding of the [place] Date Initiated: 01/06/2020 Target Resolution Date: 01/20/2020 Goal Status: Active Interventions: Provide education on orientation to the wound center Notes: Peripheral Neuropathy Nursing Diagnoses: Knowledge deficit related to disease process and management of peripheral neurovascular dysfunction Goals: Patient/caregiver will verbalize understanding of disease process and disease management Date Initiated: 01/06/2020 Target Resolution Date: 01/20/2020 Goal Status: Active Interventions: Assess signs and symptoms of neuropathy upon admission and as needed Notes: Wound/Skin Impairment Nursing Diagnoses: Impaired tissue integrity Goals: Ulcer/skin breakdown will have a volume reduction of 30% by week 4 LEAMON, WENNBERG ([id number]) Date Initiated: 01/06/2020 Target Resolution Date: 02/06/2020 Goal Status: Active Interventions: Assess patient/caregiver ability to obtain necessary supplies Assess patient/caregiver ability to perform ulcer/skin care regimen upon admission and as needed Assess ulceration(s) every visit Treatment Activities: Referred to DME Rethel Sebek for dressing supplies : 01/06/2020 Skin care regimen initiated : 01/06/2020 Topical wound management initiated : 01/06/2020 Notes: Electronic Signature(s) Signed: 01/14/2020 5:18:09 PM By: Army Melia Entered By: Army Melia on 01/14/2020 14:48:33 Robert Castro, Robert Castro ([id number]) -------------------------------------------------------------------------------- Pain Assessment Details Patient Name:  Robert Castro Date of Service: 01/14/2020 1:30 PM Medical Record Number: [id number] Patient Account Number: [id number] Date of Birth/Sex: Jan 26, 1951 (68 y.o. M) Treating RN: Montey Hora Primary Care Ellysa Parrack: Bing Matter Other Clinician: Referring Grete Bosko: Bing Matter Treating Kiyanna Biegler/Extender: Tito Dine in Treatment: 1 Active Problems Location of Pain Severity and Description of Pain Patient Has Paino No Site Locations Pain Management and Medication Current Pain Management: Electronic Signature(s) Signed: 01/14/2020 5:37:23 PM By: Montey Hora Entered By: Montey Hora on 01/14/2020 13:57:07 Robert Castro, Robert Castro ([id number]) -------------------------------------------------------------------------------- Patient/Caregiver Education Details Patient Name: Robert Castro Date of Service: 01/14/2020 1:30 PM Medical Record Number: [id number] Patient Account Number: [id number] Date of Birth/Gender: Nov 12, 1950 (68 y.o. M) Treating RN: Army Melia Primary Care Physician: Bing Matter Other Clinician: Referring Physician: Bing Matter Treating Physician/Extender: Tito Dine in Treatment: 1 Education Assessment Education Provided To: Patient Education Topics Provided Wound/Skin Impairment: Handouts: Caring for Your Ulcer Methods: Demonstration, Explain/Verbal Responses: State content correctly Electronic Signature(s) Signed: 01/14/2020 5:18:09 PM By: Army Melia Entered By: Army Melia on 01/14/2020 14:50:54  Robert Castro, Robert Castro ([id number]) -------------------------------------------------------------------------------- Wound Assessment Details Patient Name: Robert Castro, Robert Castro Date of Service: 01/14/2020 1:30 PM Medical Record Number: [id number] Patient Account Number: [id number] Date of Birth/Sex: 07-21-1951 (68 y.o. M) Treating RN: Montey Hora Primary Care Sakai Wolford: Bing Matter Other Clinician: Referring Karenann Mcgrory: Bing Matter Treating Meric Joye/Extender: Tito Dine in Treatment: 1 Wound Status Wound Number: 1 Primary Etiology: Diabetic Wound/Ulcer of the Lower Extremity Wound Location: Right, Plantar Toe Great Wound Status: Open Wounding Event: Gradually Appeared Comorbid History: Hypertension, Type II Diabetes, Osteoarthritis Date Acquired: 12/26/2019 Weeks Of Treatment: 1 Clustered Wound: No Photos Wound Measurements Length: (cm) 0.7 % Re Width: (cm) 0.5 % Re Depth: (cm) 0.1 Epit Area: (cm) 0.275 Tun Volume: (cm) 0.027 Und duction in Area: 30% duction in Volume: 30.8% helialization: None neling: No ermining: No Wound Description Classification: Grade 2 Foul Wound Margin: Flat and Intact Slou Exudate Amount: Medium Exudate Type: Serosanguineous Exudate Color: red, brown Odor After Cleansing: No gh/Fibrino Yes Wound Bed Granulation Amount: Large (67-100%) Exposed Structure Granulation Quality: Red Fascia Exposed: No Necrotic Amount: Small (1-33%) Fat Layer (Subcutaneous Tissue) Exposed: Yes Necrotic Quality: Adherent Slough Tendon Exposed: No Muscle Exposed: No Joint Exposed: No Bone Exposed: No Treatment Notes Wound #1 (Right, Plantar Toe Great) Notes prisma, conform Electronic Signature(s) Signed: 01/14/2020 5:37:23 PM By: Era Bumpers ([id number]) Entered By: Montey Hora on 01/14/2020 14:02:55 [facility], Robert Castro ([id number]) -------------------------------------------------------------------------------- Vitals Details Patient Name: Robert Castro Date of Service: 01/14/2020 1:30 PM Medical Record Number: [id number] Patient Account Number: [id number] Date of Birth/Sex: 19-Dec-1950 (68 y.o. M) Treating RN: Army Melia Primary Care Ram Haugan: Bing Matter Other Clinician: Referring Domanic Matusek: Bing Matter Treating Bessye Stith/Extender: Tito Dine in Treatment: 1 Vital Signs Time Taken: 13:50 Temperature (F):  97.7 Height (in): 71 Pulse (bpm): 97 Weight (lbs): 245 Respiratory Rate (breaths/min): 18 Body Mass Index (BMI): 34.2 Blood Pressure (mmHg): 156/88 Reference Range: 80 - 120 mg / dl Electronic Signature(s) Signed: 01/14/2020 5:33:40 PM By: Lorine Bears RCP, RRT, CHT Entered By: Lorine Bears on 01/14/2020 13:53:20

## 2020-01-20 ENCOUNTER — Encounter: Payer: Medicare Other | Admitting: Internal Medicine

## 2020-01-20 ENCOUNTER — Other Ambulatory Visit: Payer: Self-pay

## 2020-01-20 DIAGNOSIS — E11621 Type 2 diabetes mellitus with foot ulcer: Secondary | ICD-10-CM | POA: Diagnosis not present

## 2020-01-20 NOTE — Progress Notes (Addendum)
Robert, Castro ([id number]) Visit Report for 01/20/2020 Arrival Information Details Patient Name: Robert Castro, Robert Castro Date of Service: 01/20/2020 3:15 PM Medical Record Number: [id number] Patient Account Number: [id number] Date of Birth/Sex: 02/28/1951 (68 y.o. M) Treating RN: Cornell Barman Primary Care Krysti Hickling: Bing Matter Other Clinician: Referring Kealey Kemmer: Bing Matter Treating Camille Thau/Extender: Beverly Gust in Treatment: 2 Visit Information History Since Last Visit Added or deleted any medications: No Patient Arrived: Ambulatory Any new allergies or adverse reactions: No Arrival Time: 15:30 Had a fall or experienced change in No Accompanied By: self activities of daily living that may affect Transfer Assistance: None risk of falls: Patient Identification Verified: Yes Signs or symptoms of abuse/neglect since last visito No Hospitalized since last visit: No Implantable device outside of the clinic excluding No cellular tissue based products placed in the center since last visit: Has Dressing in Place as Prescribed: Yes Pain Present Now: No Electronic Signature(s) Signed: 01/21/2020 11:04:59 AM By: Lorine Bears RCP, RRT, CHT Entered By: Lorine Bears on 01/20/2020 15:32:07 Robert Castro ([id number]) -------------------------------------------------------------------------------- Clinic Level of Care Assessment Details Patient Name: Robert Castro Date of Service: 01/20/2020 3:15 PM Medical Record Number: [id number] Patient Account Number: [id number] Date of Birth/Sex: Sep 15, 1950 (68 y.o. M) Treating RN: Cornell Barman Primary Care Robert Castro: Bing Matter Other Clinician: Referring Robert Castro: Bing Matter Treating Robert Castro/Extender: Beverly Gust in Treatment: 2 Clinic Level of Care Assessment Items TOOL 4 Quantity Score []  - Use when only an EandM is performed on FOLLOW-UP visit 0 ASSESSMENTS - Nursing Assessment /  Reassessment X - Reassessment of Co-morbidities (includes updates in patient status) 1 10 X- 1 5 Reassessment of Adherence to Treatment Plan ASSESSMENTS - Wound and Skin Assessment / Reassessment X - Simple Wound Assessment / Reassessment - one wound 1 5 []  - 0 Complex Wound Assessment / Reassessment - multiple wounds []  - 0 Dermatologic / Skin Assessment (not related to wound area) ASSESSMENTS - Focused Assessment []  - Circumferential Edema Measurements - multi extremities 0 []  - 0 Nutritional Assessment / Counseling / Intervention []  - 0 Lower Extremity Assessment (monofilament, tuning fork, pulses) []  - 0 Peripheral Arterial Disease Assessment (using hand held doppler) ASSESSMENTS - Ostomy and/or Continence Assessment and Care []  - Incontinence Assessment and Management 0 []  - 0 Ostomy Care Assessment and Management (repouching, etc.) PROCESS - Coordination of Care X - Simple Patient / Family Education for ongoing care 1 15 []  - 0 Complex (extensive) Patient / Family Education for ongoing care []  - 0 Staff obtains Programmer, systems, Records, Test Results / Process Orders []  - 0 Staff telephones HHA, Nursing Homes / Clarify orders / etc []  - 0 Routine Transfer to another Facility (non-emergent condition) []  - 0 Routine Hospital Admission (non-emergent condition) []  - 0 New Admissions / Biomedical engineer / Ordering NPWT, Apligraf, etc. []  - 0 Emergency Hospital Admission (emergent condition) X- 1 10 Simple Discharge Coordination []  - 0 Complex (extensive) Discharge Coordination PROCESS - Special Needs []  - Pediatric / Minor Patient Management 0 []  - 0 Isolation Patient Management []  - 0 Hearing / Language / Visual special needs []  - 0 Assessment of Community assistance (transportation, D/C planning, etc.) []  - 0 Additional assistance / Altered mentation []  - 0 Support Surface(s) Assessment (bed, cushion, seat, etc.) INTERVENTIONS - Wound Cleansing /  Measurement Parodi, Castro S. ([id number]) X- 1 5 Simple Wound Cleansing - one wound []  - 0 Complex Wound Cleansing - multiple wounds X- 1 5 Wound Imaging (photographs - any  number of wounds) []  - 0 Wound Tracing (instead of photographs) X- 1 5 Simple Wound Measurement - one wound []  - 0 Complex Wound Measurement - multiple wounds INTERVENTIONS - Wound Dressings []  - Small Wound Dressing one or multiple wounds 0 X- 1 15 Medium Wound Dressing one or multiple wounds []  - 0 Large Wound Dressing one or multiple wounds []  - 0 Application of Medications - topical []  - 0 Application of Medications - injection INTERVENTIONS - Miscellaneous []  - External ear exam 0 []  - 0 Specimen Collection (cultures, biopsies, blood, body fluids, etc.) []  - 0 Specimen(s) / Culture(s) sent or taken to Lab for analysis []  - 0 Patient Transfer (multiple staff / Civil Service fast streamer / Similar devices) []  - 0 Simple Staple / Suture removal (25 or less) []  - 0 Complex Staple / Suture removal (26 or more) []  - 0 Hypo / Hyperglycemic Management (close monitor of Blood Glucose) []  - 0 Ankle / Brachial Index (ABI) - do not check if billed separately X- 1 5 Vital Signs Has the patient been seen at the hospital within the last three years: Yes Total Score: 80 Level Of Care: New/Established - Level 3 Electronic Signature(s) Signed: 01/20/2020 5:27:22 PM By: Gretta Cool, BSN, RN, CWS, Kim RN, BSN Entered By: Gretta Cool, BSN, RN, CWS, Kim on 01/20/2020 15:43:41 Moede, Norva Riffle ([id number]) -------------------------------------------------------------------------------- Encounter Discharge Information Details Patient Name: Robert Castro Date of Service: 01/20/2020 3:15 PM Medical Record Number: [id number] Patient Account Number: [id number] Date of Birth/Sex: 08-Sep-1950 (68 y.o. M) Treating RN: Cornell Barman Primary Care Gentle Hoge: Bing Matter Other Clinician: Referring Marieme Mcmackin: Bing Matter Treating  Lenwood Balsam/Extender: Beverly Gust in Treatment: 2 Encounter Discharge Information Items Discharge Condition: Stable Ambulatory Status: Ambulatory Discharge Destination: Home Transportation: Private Auto Accompanied By: self Schedule Follow-up Appointment: Yes Clinical Summary of Care: Electronic Signature(s) Signed: 01/20/2020 5:27:22 PM By: Gretta Cool, BSN, RN, CWS, Kim RN, BSN Entered By: Gretta Cool, BSN, RN, CWS, Kim on 01/20/2020 15:44:30 Sachs, Norva Riffle ([id number]) -------------------------------------------------------------------------------- Lower Extremity Assessment Details Patient Name: Robert Castro Date of Service: 01/20/2020 3:15 PM Medical Record Number: [id number] Patient Account Number: [id number] Date of Birth/Sex: 1951-08-11 (68 y.o. M) Treating RN: Army Melia Primary Care Kaysia Willard: Bing Matter Other Clinician: Referring Josh Nicolosi: Bing Matter Treating Riddhi Grether/Extender: Beverly Gust in Treatment: 2 Edema Assessment Assessed: [Left: No] [Right: No] Edema: [Left: N] [Right: o] Vascular Assessment Pulses: Dorsalis Pedis Palpable: [Right:Yes] Electronic Signature(s) Signed: 01/20/2020 3:31:36 PM By: Army Melia Entered By: Army Melia on 01/20/2020 15:31:35 Palencia, Norva Riffle ([id number]) -------------------------------------------------------------------------------- Multi Wound Chart Details Patient Name: Robert Castro Date of Service: 01/20/2020 3:15 PM Medical Record Number: [id number] Patient Account Number: [id number] Date of Birth/Sex: [birth year] (68 y.o. M) Treating RN: Cornell Barman Primary Care Tonio Seider: Bing Matter Other Clinician: Referring Ciaran Begay: Bing Matter Treating Chadwin Fury/Extender: Beverly Gust in Treatment: 2 Vital Signs Height(in): 71 Pulse(bpm): 119 Weight(lbs): 245 Blood Pressure(mmHg): 160/98 Body Mass Index(BMI): 34 Temperature(F): 98.3 Respiratory Rate(breaths/min): 18 Photos:  [N/A:N/A] Wound Location: Right, Plantar Toe Great N/A N/A Wounding Event: Gradually Appeared N/A N/A Primary Etiology: Diabetic Wound/Ulcer of the Lower N/A N/A Extremity Comorbid History: Hypertension, Type II Diabetes, N/A N/A Osteoarthritis Date Acquired: 12/26/2019 N/A N/A Weeks of Treatment: 2 N/A N/A Wound Status: Open N/A N/A Measurements L x W x D (cm) 0.5x0.4x0.1 N/A N/A Area (cm) : 0.157 N/A N/A Volume (cm) : 0.016 N/A N/A % Reduction in Area: 60.10% N/A N/A % Reduction in Volume: 59.00% N/A N/A Classification: Grade 2 N/A  N/A Exudate Amount: Medium N/A N/A Exudate Type: Serosanguineous N/A N/A Exudate Color: red, brown N/A N/A Wound Margin: Flat and Intact N/A N/A Granulation Amount: Large (67-100%) N/A N/A Granulation Quality: Red N/A N/A Necrotic Amount: Small (1-33%) N/A N/A Exposed Structures: Fat Layer (Subcutaneous Tissue) N/A N/A Exposed: Yes Fascia: No Tendon: No Muscle: No Joint: No Bone: No Epithelialization: None N/A N/A Treatment Notes Electronic Signature(s) Signed: 01/20/2020 5:27:22 PM By: Gretta Cool, BSN, RN, CWS, Kim RN, BSN Entered By: Gretta Cool, BSN, RN, CWS, Kim on 01/20/2020 15:41:19 Robert Castro ([id number]) -------------------------------------------------------------------------------- [place] Details Patient Name: Robert Castro Date of Service: 01/20/2020 3:15 PM Medical Record Number: [id number] Patient Account Number: [id number] Date of Birth/Sex: Jun 19, 1951 (68 y.o. M) Treating RN: Cornell Barman Primary Care Chevelle Coulson: Bing Matter Other Clinician: Referring Jaylun Fleener: Bing Matter Treating Isauro Skelley/Extender: Beverly Gust in Treatment: 2 Active Inactive Necrotic Tissue Nursing Diagnoses: Impaired tissue integrity related to necrotic/devitalized tissue Goals: Necrotic/devitalized tissue will be minimized in the wound bed Date Initiated: 01/06/2020 Target Resolution Date: 01/20/2020 Goal Status:  Active Interventions: Assess patient pain level pre-, during and post procedure and prior to discharge Treatment Activities: Apply topical anesthetic as ordered : 01/06/2020 Notes: Orientation to the Wound Care Program Nursing Diagnoses: Knowledge deficit related to the wound healing center program Goals: Patient/caregiver will verbalize understanding of the [facility] Date Initiated: 01/06/2020 Target Resolution Date: 01/20/2020 Goal Status: Active Interventions: Provide education on orientation to the wound center Notes: Peripheral Neuropathy Nursing Diagnoses: Knowledge deficit related to disease process and management of peripheral neurovascular dysfunction Goals: Patient/caregiver will verbalize understanding of disease process and disease management Date Initiated: 01/06/2020 Target Resolution Date: 01/20/2020 Goal Status: Active Interventions: Assess signs and symptoms of neuropathy upon admission and as needed Notes: Wound/Skin Impairment Nursing Diagnoses: Impaired tissue integrity Goals: Ulcer/skin breakdown will have a volume reduction of 30% by week 4 CLELAND, GEHRIS ([id number]) Date Initiated: 01/06/2020 Target Resolution Date: 02/06/2020 Goal Status: Active Interventions: Assess patient/caregiver ability to obtain necessary supplies Assess patient/caregiver ability to perform ulcer/skin care regimen upon admission and as needed Assess ulceration(s) every visit Treatment Activities: Referred to DME Sharika Mosquera for dressing supplies : 01/06/2020 Skin care regimen initiated : 01/06/2020 Topical wound management initiated : 01/06/2020 Notes: Electronic Signature(s) Signed: 01/20/2020 5:27:22 PM By: Gretta Cool, BSN, RN, CWS, Kim RN, BSN Entered By: Gretta Cool, BSN, RN, CWS, Kim on 01/20/2020 15:41:10 Robert Castro ([id number]) -------------------------------------------------------------------------------- Patient/Caregiver Education Details Patient Name:  Robert Castro Date of Service: 01/20/2020 3:15 PM Medical Record Number: [id number] Patient Account Number: [id number] Date of Birth/Gender: 02-25-1951 (68 y.o. M) Treating RN: Cornell Barman Primary Care Physician: Bing Matter Other Clinician: Referring Physician: Bing Matter Treating Physician/Extender: Beverly Gust in Treatment: 2 Education Assessment Education Provided To: Patient Education Topics Provided Wound/Skin Impairment: Handouts: Caring for Your Ulcer Methods: Demonstration, Explain/Verbal Responses: State content correctly Electronic Signature(s) Signed: 01/20/2020 5:27:22 PM By: Gretta Cool, BSN, RN, CWS, Kim RN, BSN Entered By: Gretta Cool, BSN, RN, CWS, Kim on 01/20/2020 15:43:59 Gilliand, Norva Riffle ([id number]) -------------------------------------------------------------------------------- Wound Assessment Details Patient Name: Robert Castro Date of Service: 01/20/2020 3:15 PM Medical Record Number: [id number] Patient Account Number: [id number] Date of Birth/Sex: 1951-04-27 (68 y.o. M) Treating RN: Army Melia Primary Care Silver Achey: Bing Matter Other Clinician: Referring Montie Gelardi: Bing Matter Treating Chessa Barrasso/Extender: Beverly Gust in Treatment: 2 Wound Status Wound Number: 1 Primary Etiology: Diabetic Wound/Ulcer of the Lower Extremity Wound Location: Right, Plantar Toe Great Wound Status: Open Wounding Event: Gradually Appeared  Comorbid History: Hypertension, Type II Diabetes, Osteoarthritis Date Acquired: 12/26/2019 Weeks Of Treatment: 2 Clustered Wound: No Photos Wound Measurements Length: (cm) 0.5 % Red Width: (cm) 0.4 % Red Depth: (cm) 0.1 Epith Area: (cm) 0.157 Tunn Volume: (cm) 0.016 Unde uction in Area: 60.1% uction in Volume: 59% elialization: None eling: No rmining: No Wound Description Classification: Grade 2 Foul Wound Margin: Flat and Intact Sloug Exudate Amount: Medium Exudate Type: Serosanguineous Exudate  Color: red, brown Odor After Cleansing: No h/Fibrino Yes Wound Bed Granulation Amount: Large (67-100%) Exposed Structure Granulation Quality: Red Fascia Exposed: No Necrotic Amount: Small (1-33%) Fat Layer (Subcutaneous Tissue) Exposed: Yes Necrotic Quality: Adherent Slough Tendon Exposed: No Muscle Exposed: No Joint Exposed: No Bone Exposed: No Treatment Notes Wound #1 (Right, Plantar Toe Great) Notes prisma, conform, felt Electronic Signature(s) Signed: 01/20/2020 3:31:23 PM By: Vevelyn Francois ([id number]) Entered By: Army Melia on 01/20/2020 15:31:22 Larock, Norva Riffle ([id number]) -------------------------------------------------------------------------------- Vitals Details Patient Name: Robert Castro Date of Service: 01/20/2020 3:15 PM Medical Record Number: [id number] Patient Account Number: [id number] Date of Birth/Sex: 1951/01/19 (68 y.o. M) Treating RN: Cornell Barman Primary Care Torell Minder: Bing Matter Other Clinician: Referring Yonna Alwin: Bing Matter Treating Jayde Daffin/Extender: Beverly Gust in Treatment: 2 Vital Signs Time Taken: 15:30 Temperature (F): 98.3 Height (in): 71 Pulse (bpm): 119 Weight (lbs): 245 Respiratory Rate (breaths/min): 18 Body Mass Index (BMI): 34.2 Blood Pressure (mmHg): 160/98 Reference Range: 80 - 120 mg / dl Electronic Signature(s) Signed: 01/21/2020 11:04:59 AM By: Lorine Bears RCP, RRT, CHT Entered By: Lorine Bears on 01/20/2020 15:32:37

## 2020-01-21 NOTE — Progress Notes (Signed)
Robert Castro, Robert Castro ([id number]) Visit Report for 01/20/2020 HPI Details Patient Name: Robert Castro, Robert Castro Date of Service: 01/20/2020 3:15 PM Medical Record Number: [id number] Patient Account Number: [id number] Date of Birth/Sex: 11/04/1950 (68 y.o. M) Treating RN: Cornell Barman Primary Care Provider: Bing Matter Other Clinician: Referring Provider: Bing Matter Treating Provider/Extender: Beverly Gust in Treatment: 2 History of Present Illness HPI Description: ADMISSION 01/06/2020 Patient is a 69 year old man with multiple medical problems. Among them he is type 2 diabetes with peripheral neuropathy. He is here for review of an area on the right first plantar toe over the proximal phalanx. He says he got this about 3 weeks ago when he was stepping out of the hot tub he stepped on a rock. He says he did not even feel anything was wrong. He went to see podiatry and he was prescribed Santyl although he has not yet started this. He was using mupirocin prior to this after seeing his primary doctor on 4/26. 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 Still clean on the plantar right great toe we use silver collagen 01/20/20-Patient comes at 1 week, the wound is improving, there is very minimal  callus around the rim Electronic Signature(s) Signed: 01/20/2020 3:43:51 PM By: Tobi Bastos MD, MBA Entered By: Tobi Bastos on 01/20/2020 15:43:50 Whirley, Robert Castro ([id number]) -------------------------------------------------------------------------------- Physical Exam Details Patient Name: Robert Castro Date of Service: 01/20/2020 3:15 PM Medical Record Number: [id number] Patient Account Number: [id number] Date of Birth/Sex: [birth year] (68 y.o. M) Treating RN: Cornell Barman Primary Care Provider: Bing Matter Other Clinician: Referring Provider: Bing Matter Treating Provider/Extender: Beverly Gust in Treatment: 2 Constitutional alert and oriented x 3. sitting or standing blood pressure is within target range for patient.. supine blood pressure is within target range for patient.. pulse regular and within target range for patient.[name] respirations regular, non-labored and within target range for patient.[name] temperature within target range for patient.. . . Well-nourished and well-hydrated in no acute distress. Notes Small wound on the plantar aspect below the great toe, but the very mild callus rim, surrounding skin looks intact Electronic Signature(s) Signed: 01/20/2020 3:44:17 PM By: Tobi Bastos MD, MBA Entered By: Tobi Bastos on 01/20/2020 15:44:16 Wayment, Robert Castro ([id number]) -------------------------------------------------------------------------------- Physician Orders Details Patient Name: Robert Castro Date of Service: 01/20/2020 3:15 PM Medical Record Number: [id number] Patient Account Number: [id number] Date of Birth/Sex: Dec 29, 1950 (68 y.o. M) Treating RN: Cornell Barman Primary Care Provider: Bing Matter Other Clinician: Referring Provider: Bing Matter Treating Provider/Extender: Beverly Gust in Treatment: 2 Verbal / Phone Orders: No Diagnosis Coding Wound Cleansing Wound #1 Right,Plantar Toe Great o Clean wound with  Normal Saline. Anesthetic (add to Medication List) Wound #1 Right,Plantar Toe Great o Topical Lidocaine 4% cream applied to wound bed prior  to debridement (In Clinic Only). Primary Wound Dressing Wound #1 Right,Plantar Toe Great o Silver Collagen Secondary Dressing Wound #1 Right,Plantar Toe Great o Gauze and Kerlix/Conform o Other - offloading felt Dressing Change Frequency Wound #1 Right,Plantar Toe Great o Change Dressing Monday, Wednesday, Friday Follow-up Appointments o Return Appointment in 2 weeks. Off-Loading Wound #1 Right,Plantar Toe Great o Open toe surgical shoe - Right Additional Orders / Instructions o Activity as tolerated Electronic Signature(s) Signed: 01/20/2020 3:57:01 PM By: Tobi Bastos MD, MBA Signed: 01/20/2020 5:27:22 PM By: Gretta Cool, BSN, RN, CWS, Kim RN, BSN Entered By: Gretta Cool, BSN, RN, CWS, Kim on 01/20/2020 15:45:18 Brouwer, Robert Castro ([id number]) -------------------------------------------------------------------------------- Progress Note Details Patient Name: Robert Castro Date of Service: 01/20/2020 3:15 PM Medical Record Number: [id number] Patient Account Number: [id number] Date of Birth/Sex: [birth year] (68 y.o. M) Treating RN: Cornell Barman Primary Care Provider: Bing Matter Other Clinician: Referring Provider: Bing Matter Treating Provider/Extender: Beverly Gust in Treatment: 2 Subjective History of Present Illness (HPI) ADMISSION 01/06/2020 Patient is a 69 year old man with multiple medical problems. Among them he is type 2 diabetes with peripheral neuropathy. He is here for review of an area on the right first plantar toe over the proximal phalanx. He says he got this about 3 weeks ago when he was stepping out of the hot tub he stepped on a rock. He says he did not even feel anything was wrong. He went to see podiatry and he was prescribed Santyl although he has not yet started this. He was using mupirocin prior  to this after seeing his primary doctor on 4/26. 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 Still clean on the plantar right great toe we use silver collagen 01/20/20-Patient comes at 1 week, the wound is improving, there is very minimal callus around the rim Objective Constitutional alert and oriented x 3. sitting or standing blood pressure is within target range for patient.. supine blood pressure is within target range for patient.. pulse regular and within target range for patient.[name] respirations regular, non-labored and within target range for patient.[name] temperature within target range for patient.. Well-nourished and well-hydrated in no acute distress. Vitals Time Taken: 3:30 PM, Height: 71 in, Weight: 245 lbs, BMI: 34.2, Temperature: 98.3 F, Pulse: 119 bpm, Respiratory Rate: 18 breaths/min, Blood Pressure: 160/98 mmHg. Robert Castro ([id number]) General Notes: Small wound on the plantar aspect below the great toe, but the very mild callus rim, surrounding skin looks intact Integumentary (Hair, Skin) Wound #1 status is Open. Original cause of wound was Gradually Appeared. The wound is located on the [employer]. The wound measures 0.5cm length x 0.4cm width x 0.1cm depth; 0.157cm^2 area and 0.016cm^3 volume. There is Fat Layer (Subcutaneous Tissue) Exposed exposed. There is no  tunneling or undermining noted. There is a medium amount of serosanguineous drainage noted. The wound margin is flat and intact. There is large (67-100%) red granulation within the wound bed. There is a small (1-33%) amount of necrotic tissue within the wound bed including Adherent Slough. Plan Wound Cleansing: Wound #1 Right,Plantar Toe Great:  Clean wound with Normal Saline. Anesthetic (add to Medication List): Wound #1 Right,Plantar Toe Great: Topical Lidocaine 4% cream applied to wound bed prior to debridement (In Clinic Only). Primary Wound Dressing: Wound #1 Right,Plantar Toe Great: Silver Collagen Secondary Dressing: Wound #1 Right,Plantar Toe Great: Gauze and Kerlix/Conform Other - offloading felt Dressing Change Frequency: Wound #1 Right,Plantar Toe Great: Change Dressing Monday, Wednesday, Friday Follow-up Appointments: Wound #1 Right,Plantar Toe Great: Return Appointment in 1 week. Off-Loading: Wound #1 Right,Plantar Toe Great: Open toe surgical shoe - Right Additional Orders / Instructions: Activity as tolerated The right-great toe plantar wound is very small, and appears to be getting close to healing, surrounding skin looks intact, he is offloading with a surgical shoe, will continue with the Prisma and return in 1 to 2 weeks Electronic Signature(s) Signed: 01/20/2020 3:45:06 PM By: Tobi Bastos MD, MBA Entered By: Tobi Bastos on 01/20/2020 15:45:06 Maybee, Robert Castro ([id number]) -------------------------------------------------------------------------------- SuperBill Details Patient Name: Robert Castro Date of Service: 01/20/2020 Medical Record Number: [id number] Patient Account Number: [id number] Date of Birth/Sex: 03-Dec-1950 (69 y.o. M) Treating RN: Cornell Barman Primary Care Provider: Bing Matter Other Clinician: Referring Provider: Bing Matter Treating Provider/Extender: Beverly Gust in Treatment: 2 Diagnosis Coding ICD-10  Codes Code Description E11.621 Type 2 diabetes mellitus with foot ulcer L97.512 Non-pressure chronic ulcer of other part of right foot with fat layer exposed E11.42 Type 2 diabetes mellitus with diabetic polyneuropathy Facility Procedures CPT4 Code: [id number] Description: 99213 - WOUND CARE VISIT-LEV 3 EST PT Modifier: Quantity: 1 Physician Procedures CPT4 Code: [id number] Description: [id number] - WC PHYS LEVEL 3 - EST PT Modifier: Quantity: 1 CPT4 Code: Description: ICD-10 Diagnosis Description E11.621 Type 2 diabetes mellitus with foot ulcer Modifier: Quantity: Electronic Signature(s) Signed: 01/20/2020 3:45:20 PM By: Tobi Bastos MD, MBA Entered By: Tobi Bastos on 01/20/2020 15:45:19

## 2020-01-27 ENCOUNTER — Other Ambulatory Visit (INDEPENDENT_AMBULATORY_CARE_PROVIDER_SITE_OTHER): Payer: Medicare Other

## 2020-01-27 DIAGNOSIS — D582 Other hemoglobinopathies: Secondary | ICD-10-CM

## 2020-01-27 DIAGNOSIS — K709 Alcoholic liver disease, unspecified: Secondary | ICD-10-CM

## 2020-01-28 LAB — CBC WITH DIFFERENTIAL/PLATELET
Basophils Absolute: 0 10*3/uL (ref 0.0–0.1)
Basophils Relative: 0.9 % (ref 0.0–3.0)
Eosinophils Absolute: 0.1 10*3/uL (ref 0.0–0.7)
Eosinophils Relative: 2.7 % (ref 0.0–5.0)
HCT: 54 % — ABNORMAL HIGH (ref 39.0–52.0)
Hemoglobin: 18.4 g/dL (ref 13.0–17.0)
Lymphocytes Relative: 31.3 % (ref 12.0–46.0)
Lymphs Abs: 1.5 10*3/uL (ref 0.7–4.0)
MCHC: 34.1 g/dL (ref 30.0–36.0)
MCV: 98 fl (ref 78.0–100.0)
Monocytes Absolute: 0.7 10*3/uL (ref 0.1–1.0)
Monocytes Relative: 13.6 % — ABNORMAL HIGH (ref 3.0–12.0)
Neutro Abs: 2.5 10*3/uL (ref 1.4–7.7)
Neutrophils Relative %: 51.5 % (ref 43.0–77.0)
Platelets: 134 10*3/uL — ABNORMAL LOW (ref 150.0–400.0)
RBC: 5.51 Mil/uL (ref 4.22–5.81)
RDW: 14.8 % (ref 11.5–15.5)
WBC: 4.9 10*3/uL (ref 4.0–10.5)

## 2020-01-29 ENCOUNTER — Telehealth: Payer: Self-pay | Admitting: Gastroenterology

## 2020-01-29 ENCOUNTER — Other Ambulatory Visit: Payer: Self-pay

## 2020-01-29 DIAGNOSIS — D582 Other hemoglobinopathies: Secondary | ICD-10-CM

## 2020-01-29 NOTE — Telephone Encounter (Signed)
See results notes for details.  

## 2020-01-29 NOTE — Telephone Encounter (Signed)
Returning your call. °

## 2020-02-01 ENCOUNTER — Encounter: Payer: Medicare Other | Attending: Physician Assistant | Admitting: Physician Assistant

## 2020-02-01 ENCOUNTER — Other Ambulatory Visit: Payer: Self-pay

## 2020-02-01 DIAGNOSIS — H3530 Unspecified macular degeneration: Secondary | ICD-10-CM | POA: Diagnosis not present

## 2020-02-01 DIAGNOSIS — E11621 Type 2 diabetes mellitus with foot ulcer: Secondary | ICD-10-CM | POA: Insufficient documentation

## 2020-02-01 DIAGNOSIS — L97512 Non-pressure chronic ulcer of other part of right foot with fat layer exposed: Secondary | ICD-10-CM | POA: Diagnosis not present

## 2020-02-01 DIAGNOSIS — F329 Major depressive disorder, single episode, unspecified: Secondary | ICD-10-CM | POA: Insufficient documentation

## 2020-02-01 DIAGNOSIS — E785 Hyperlipidemia, unspecified: Secondary | ICD-10-CM | POA: Diagnosis not present

## 2020-02-01 DIAGNOSIS — M1990 Unspecified osteoarthritis, unspecified site: Secondary | ICD-10-CM | POA: Insufficient documentation

## 2020-02-01 DIAGNOSIS — K2270 Barrett's esophagus without dysplasia: Secondary | ICD-10-CM | POA: Diagnosis not present

## 2020-02-01 DIAGNOSIS — N400 Enlarged prostate without lower urinary tract symptoms: Secondary | ICD-10-CM | POA: Insufficient documentation

## 2020-02-01 DIAGNOSIS — E1140 Type 2 diabetes mellitus with diabetic neuropathy, unspecified: Secondary | ICD-10-CM | POA: Insufficient documentation

## 2020-02-01 DIAGNOSIS — K219 Gastro-esophageal reflux disease without esophagitis: Secondary | ICD-10-CM | POA: Diagnosis not present

## 2020-02-01 DIAGNOSIS — G4730 Sleep apnea, unspecified: Secondary | ICD-10-CM | POA: Diagnosis not present

## 2020-02-01 DIAGNOSIS — K317 Polyp of stomach and duodenum: Secondary | ICD-10-CM | POA: Diagnosis not present

## 2020-02-01 DIAGNOSIS — I10 Essential (primary) hypertension: Secondary | ICD-10-CM | POA: Diagnosis not present

## 2020-02-01 DIAGNOSIS — E039 Hypothyroidism, unspecified: Secondary | ICD-10-CM | POA: Insufficient documentation

## 2020-02-01 DIAGNOSIS — K760 Fatty (change of) liver, not elsewhere classified: Secondary | ICD-10-CM | POA: Diagnosis not present

## 2020-02-01 DIAGNOSIS — E1142 Type 2 diabetes mellitus with diabetic polyneuropathy: Secondary | ICD-10-CM | POA: Diagnosis not present

## 2020-02-01 DIAGNOSIS — E1151 Type 2 diabetes mellitus with diabetic peripheral angiopathy without gangrene: Secondary | ICD-10-CM | POA: Insufficient documentation

## 2020-02-01 NOTE — Progress Notes (Addendum)
Robert, Castro ([id number]) Visit Report for 02/01/2020 Arrival Information Details Patient Name: Robert Castro, Robert Castro Date of Service: 02/01/2020 1:30 PM Medical Record Number: [id number] Patient Account Number: [id number] Date of Birth/Sex: 1951-08-22 (68 y.o. M) Treating RN: Army Melia Primary Care Mateus Rewerts: Bing Matter Other Clinician: Referring Makani Seckman: Bing Matter Treating Leighann Amadon/Extender: Melburn Hake, HOYT Weeks in Treatment: 3 Visit Information History Since Last Visit Added or deleted any medications: No Patient Arrived: Ambulatory Any new allergies or adverse reactions: No Arrival Time: 13:56 Had a fall or experienced change in No Accompanied By: self activities of daily living that may affect Transfer Assistance: None risk of falls: Patient Identification Verified: Yes Signs or symptoms of abuse/neglect since last visito No Secondary Verification Process Completed: Yes Hospitalized since last visit: No Implantable device outside of the clinic excluding No cellular tissue based products placed in the center since last visit: Has Dressing in Place as Prescribed: Yes Pain Present Now: No Electronic Signature(s) Signed: 02/01/2020 4:25:54 PM By: Lorine Bears RCP, RRT, CHT Entered By: Becky Sax, Amado Nash on 02/01/2020 13:57:11 Champeau, Norva Riffle ([id number]) -------------------------------------------------------------------------------- Encounter Discharge Information Details Patient Name: Robert Castro Date of Service: 02/01/2020 1:30 PM Medical Record Number: [id number] Patient Account Number: [id number] Date of Birth/Sex: 03/09/1951 (68 y.o. M) Treating RN: Army Melia Primary Care Jakaylee Sasaki: Bing Matter Other Clinician: Referring Jamisen Hawes: Bing Matter Treating Tiffiany Beadles/Extender: Melburn Hake, HOYT Weeks in Treatment: 3 Encounter Discharge Information Items Post Procedure Vitals Discharge Condition: Stable Temperature (F):  98.2 Ambulatory Status: Ambulatory Pulse (bpm): 89 Discharge Destination: Home Respiratory Rate (breaths/min): 16 Transportation: Private Auto Blood Pressure (mmHg): 134/76 Accompanied By: self Schedule Follow-up Appointment: Yes Clinical Summary of Care: Electronic Signature(s) Signed: 02/01/2020 4:06:31 PM By: Army Melia Entered By: Army Melia on 02/01/2020 14:27:31 Johnsey, Norva Riffle ([id number]) -------------------------------------------------------------------------------- Lower Extremity Assessment Details Patient Name: Robert Castro Date of Service: 02/01/2020 1:30 PM Medical Record Number: [id number] Patient Account Number: [id number] Date of Birth/Sex: 15-Aug-1951 (68 y.o. M) Treating RN: Cornell Barman Primary Care Maddix Kliewer: Bing Matter Other Clinician: Referring Lorin Gawron: Bing Matter Treating Lakeesha Fontanilla/Extender: Melburn Hake, HOYT Weeks in Treatment: 3 Vascular Assessment Pulses: Dorsalis Pedis Palpable: [Right:Yes] Electronic Signature(s) Signed: 02/03/2020 7:21:37 AM By: Gretta Cool, BSN, RN, CWS, Kim RN, BSN Entered By: Gretta Cool, BSN, RN, CWS, Kim on 02/01/2020 14:10:03 Robert Castro ([id number]) -------------------------------------------------------------------------------- Multi Wound Chart Details Patient Name: Robert Castro Date of Service: 02/01/2020 1:30 PM Medical Record Number: [id number] Patient Account Number: [id number] Date of Birth/Sex: 03-Jan-1951 (68 y.o. M) Treating RN: Army Melia Primary Care Corabelle Spackman: Bing Matter Other Clinician: Referring Deari Sessler: Bing Matter Treating Marilla Boddy/Extender: Melburn Hake, HOYT Weeks in Treatment: 3 Vital Signs Height(in): 71 Pulse(bpm): 87 Weight(lbs): 245 Blood Pressure(mmHg): 134/76 Body Mass Index(BMI): 34 Temperature(F): 98.2 Respiratory Rate(breaths/min): 18 Photos: [N/A:N/A] Wound Location: Right, Plantar Toe Great N/A N/A Wounding Event: Gradually Appeared N/A N/A Primary Etiology: Diabetic  Wound/Ulcer of the Lower N/A N/A Extremity Comorbid History: Hypertension, Type II Diabetes, N/A N/A Osteoarthritis Date Acquired: 12/26/2019 N/A N/A Weeks of Treatment: 3 N/A N/A Wound Status: Open N/A N/A Measurements L x W x D (cm) 0.3x0.3x0.3 N/A N/A Area (cm) : 0.071 N/A N/A Volume (cm) : 0.021 N/A N/A % Reduction in Area: 81.90% N/A N/A % Reduction in Volume: 46.20% N/A N/A Classification: Grade 2 N/A N/A Exudate Amount: None Present N/A N/A Wound Margin: Flat and Intact N/A N/A Granulation Amount: None Present (0%) N/A N/A Necrotic Amount: Large (67-100%) N/A N/A Exposed Structures: Fat Layer (Subcutaneous Tissue) N/A N/A  Exposed: Yes Fascia: No Tendon: No Muscle: No Joint: No Bone: No Epithelialization: Small (1-33%) N/A N/A Treatment Notes Electronic Signature(s) Signed: 02/01/2020 4:06:31 PM By: Army Melia Entered By: Army Melia on 02/01/2020 14:20:57 Behring, Norva Riffle ([id number]) -------------------------------------------------------------------------------- [place] Details Patient Name: Robert Castro Date of Service: 02/01/2020 1:30 PM Medical Record Number: [id number] Patient Account Number: [id number] Date of Birth/Sex: 1950/12/10 (68 y.o. M) Treating RN: Army Melia Primary Care Shakeel Disney: Bing Matter Other Clinician: Referring Sukhraj Esquivias: Bing Matter Treating Harrison Zetina/Extender: Melburn Hake, HOYT Weeks in Treatment: 3 Active Inactive Necrotic Tissue Nursing Diagnoses: Impaired tissue integrity related to necrotic/devitalized tissue Goals: Necrotic/devitalized tissue will be minimized in the wound bed Date Initiated: 01/06/2020 Target Resolution Date: 01/20/2020 Goal Status: Active Interventions: Assess patient pain level pre-, during and post procedure and prior to discharge Treatment Activities: Apply topical anesthetic as ordered : 01/06/2020 Notes: Orientation to the Wound Care Program Nursing Diagnoses: Knowledge  deficit related to the wound healing center program Goals: Patient/caregiver will verbalize understanding of the [place] Date Initiated: 01/06/2020 Target Resolution Date: 01/20/2020 Goal Status: Active Interventions: Provide education on orientation to the wound center Notes: Peripheral Neuropathy Nursing Diagnoses: Knowledge deficit related to disease process and management of peripheral neurovascular dysfunction Goals: Patient/caregiver will verbalize understanding of disease process and disease management Date Initiated: 01/06/2020 Target Resolution Date: 01/20/2020 Goal Status: Active Interventions: Assess signs and symptoms of neuropathy upon admission and as needed Notes: Wound/Skin Impairment Nursing Diagnoses: Impaired tissue integrity Goals: Ulcer/skin breakdown will have a volume reduction of 30% by week 4 BRAELEN, SPROULE ([id number]) Date Initiated: 01/06/2020 Target Resolution Date: 02/06/2020 Goal Status: Active Interventions: Assess patient/caregiver ability to obtain necessary supplies Assess patient/caregiver ability to perform ulcer/skin care regimen upon admission and as needed Assess ulceration(s) every visit Treatment Activities: Referred to DME Kolt Mcwhirter for dressing supplies : 01/06/2020 Skin care regimen initiated : 01/06/2020 Topical wound management initiated : 01/06/2020 Notes: Electronic Signature(s) Signed: 02/01/2020 4:06:31 PM By: Army Melia Entered By: Army Melia on 02/01/2020 14:20:50 Ignatowski, Norva Riffle ([id number]) -------------------------------------------------------------------------------- Pain Assessment Details Patient Name: Robert Castro Date of Service: 02/01/2020 1:30 PM Medical Record Number: [id number] Patient Account Number: [id number] Date of Birth/Sex: 09-Apr-1951 (68 y.o. M) Treating RN: Cornell Barman Primary Care Lashica Hannay: Bing Matter Other Clinician: Referring Eural Holzschuh: Bing Matter Treating  Amila Callies/Extender: Melburn Hake, HOYT Weeks in Treatment: 3 Active Problems Location of Pain Severity and Description of Pain Patient Has Paino No Site Locations Pain Management and Medication Current Pain Management: Electronic Signature(s) Signed: 02/03/2020 7:21:37 AM By: Gretta Cool, BSN, RN, CWS, Kim RN, BSN Entered By: Gretta Cool, BSN, RN, CWS, Kim on 02/01/2020 14:05:47 Robert Castro ([id number]) -------------------------------------------------------------------------------- Patient/Caregiver Education Details Patient Name: Robert Castro Date of Service: 02/01/2020 1:30 PM Medical Record Number: [id number] Patient Account Number: [id number] Date of Birth/Gender: 12-Jul-1951 (68 y.o. M) Treating RN: Army Melia Primary Care Physician: Bing Matter Other Clinician: Referring Physician: Bing Matter Treating Physician/Extender: Sharalyn Ink in Treatment: 3 Education Assessment Education Provided To: Patient Education Topics Provided Wound/Skin Impairment: Handouts: Caring for Your Ulcer Methods: Demonstration, Explain/Verbal Responses: State content correctly Electronic Signature(s) Signed: 02/01/2020 4:06:31 PM By: Army Melia Entered By: Army Melia on 02/01/2020 14:26:53 Dhawan, Norva Riffle ([id number]) -------------------------------------------------------------------------------- Wound Assessment Details Patient Name: Robert Castro Date of Service: 02/01/2020 1:30 PM Medical Record Number: [id number] Patient Account Number: [id number] Date of Birth/Sex: 11-Apr-1951 (68 y.o. M) Treating RN: Army Melia Primary Care Kamesha Herne: Bing Matter Other Clinician: Referring Cathan Gearin: Deatra Ina  KRISTEN Treating Kimber Esterly/Extender: STONE III, HOYT Weeks in Treatment: 3 Wound Status Wound Number: 1 Primary Etiology: Diabetic Wound/Ulcer of the Lower Extremity Wound Location: Right, Plantar Toe Great Wound Status: Open Wounding Event: Gradually Appeared Comorbid  History: Hypertension, Type II Diabetes, Osteoarthritis Date Acquired: 12/26/2019 Weeks Of Treatment: 3 Clustered Wound: No Photos Wound Measurements Length: (cm) 0.1 Width: (cm) 0.1 Depth: (cm) 0.1 Area: (cm) 0.008 Volume: (cm) 0.001 % Reduction in Area: 98% % Reduction in Volume: 97.4% Epithelialization: Small (1-33%) Tunneling: No Undermining: No Wound Description Classification: Grade 2 Wound Margin: Flat and Intact Exudate Amount: None Present Foul Odor After Cleansing: No Slough/Fibrino No Wound Bed Granulation Amount: None Present (0%) Exposed Structure Necrotic Amount: Large (67-100%) Fascia Exposed: No Necrotic Quality: Adherent Slough Fat Layer (Subcutaneous Tissue) Exposed: Yes Tendon Exposed: No Muscle Exposed: No Joint Exposed: No Bone Exposed: No Treatment Notes Wound #1 (Right, Plantar Toe Great) Notes prisma, conform, Electronic Signature(s) Signed: 02/01/2020 4:06:31 PM By: Army Melia Entered By: Army Melia on 02/01/2020 14:23:29 Isbell, Norva Riffle ([id number]) Lucia Gaskins, Norva Riffle ([id number]) -------------------------------------------------------------------------------- Vitals Details Patient Name: Robert Castro Date of Service: 02/01/2020 1:30 PM Medical Record Number: [id number] Patient Account Number: [id number] Date of Birth/Sex: May 24, 1951 (69 y.o. M) Treating RN: Army Melia Primary Care Keirra Zeimet: Bing Matter Other Clinician: Referring Elke Holtry: Bing Matter Treating Tylik Treese/Extender: Melburn Hake, HOYT Weeks in Treatment: 3 Vital Signs Time Taken: 13:55 Temperature (F): 98.2 Height (in): 71 Pulse (bpm): 87 Weight (lbs): 245 Respiratory Rate (breaths/min): 18 Body Mass Index (BMI): 34.2 Blood Pressure (mmHg): 134/76 Reference Range: 80 - 120 mg / dl Electronic Signature(s) Signed: 02/01/2020 4:25:54 PM By: Lorine Bears RCP, RRT, CHT Entered By: Lorine Bears on 02/01/2020 13:57:36

## 2020-02-01 NOTE — Progress Notes (Addendum)
ROWLAND, ERICSSON ([id number]) Visit Report for 02/01/2020 Chief Complaint Document Details Patient Name: Robert Castro, Robert Castro Date of Service: 02/01/2020 1:30 PM Medical Record Number: [id number] Patient Account Number: [id number] Date of Birth/Sex: 06-Nov-1950 (69 y.o. M) Treating RN: Army Melia Primary Care Provider: Bing Matter Other Clinician: Referring Provider: Bing Matter Treating Provider/Extender: Melburn Hake, Abdulkareem Badolato Weeks in Treatment: 3 Information Obtained from: Patient Chief Complaint 01/06/2020; patient is here for review of a wound on the plantar first toe over the proximal phalanx Electronic Signature(s) Signed: 02/01/2020 1:50:38 PM By: Worthy Keeler PA-C Entered By: Worthy Keeler on 02/01/2020 13:50:38 Akel, Norva Riffle ([id number]) -------------------------------------------------------------------------------- Debridement Details Patient Name: Robert Castro Date of Service: 02/01/2020 1:30 PM Medical Record Number: [id number] Patient Account Number: [id number] Date of Birth/Sex: 1951-06-01 (69 y.o. M) Treating RN: Army Melia Primary Care Provider: Bing Matter Other Clinician: Referring Provider: Bing Matter Treating Provider/Extender: Melburn Hake, Isebella Upshur Weeks in Treatment: 3 Debridement Performed for Wound #1 Right,Plantar Toe Great Assessment: Performed By: Physician STONE III, Jaeleah Smyser E., PA-C Debridement Type: Debridement Severity of Tissue Pre Debridement: Fat layer exposed Level of Consciousness (Pre- Awake and Alert procedure): Pre-procedure Verification/Time Out Yes - 14:20 Taken: Start Time: 14:21 Pain Control: Lidocaine Total Area Debrided (L x W): 0.3 (cm) x 0.3 (cm) = 0.09 (cm) Tissue and other material Viable, Non-Viable, Callus, Subcutaneous debrided: Level: Skin/Subcutaneous Tissue Debridement Description: Excisional Instrument: Curette Bleeding: None End Time: 14:22 Response to Treatment: Procedure was tolerated well Level of  Consciousness (Post- Awake and Alert procedure): Post Debridement Measurements of Total Wound Length: (cm) 0.3 Width: (cm) 0.3 Depth: (cm) 0.3 Volume: (cm) 0.021 Character of Wound/Ulcer Post Debridement: Stable Severity of Tissue Post Debridement: Fat layer exposed Post Procedure Diagnosis Same as Pre-procedure Electronic Signature(s) Signed: 02/01/2020 4:06:31 PM By: Army Melia Signed: 02/01/2020 4:20:46 PM By: Worthy Keeler PA-C Entered By: Army Melia on 02/01/2020 14:21:47 Ritchie, Norva Riffle ([id number]) -------------------------------------------------------------------------------- HPI Details Patient Name: Robert Castro Date of Service: 02/01/2020 1:30 PM Medical Record Number: [id number] Patient Account Number: [id number] Date of Birth/Sex: 11/13/1950 (69 y.o. M) Treating RN: Army Melia Primary Care Provider: Bing Matter Other Clinician: Referring Provider: Bing Matter Treating Provider/Extender: Melburn Hake, Preeti Winegardner Weeks in Treatment: 3 History of Present Illness HPI Description: ADMISSION 01/06/2020 Patient is a 69 year old man with multiple medical problems. Among them he is type 2 diabetes with peripheral neuropathy. He is here for review of an area on the right first plantar toe over the proximal phalanx. He says he got this about 3 weeks ago when he was stepping out of the hot tub he stepped on a rock. He says he did not even feel anything was wrong. He went to see podiatry and he was prescribed Santyl although he has not yet started this. He was using mupirocin prior to this after seeing his primary doctor on 4/26. 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 Still clean on the plantar right great toe we use silver collagen 01/20/20-Patient comes at 1 week, the wound is improving, there is very minimal callus around the rim 02/01/2020 upon evaluation today patient appears to be doing fairly well in regard to his great toe ulcer. Fortunately there is no signs of active infection at this time. No fever chills noted. Fortunately he seems to be making good progress currently which is great news. With that being said I did discuss with the patient today that unfortunately he is not completely healed and I still believe that he needs to be very cautious in regard to swimming he is going to the beach tomorrow. I explained that he really should not be in the ocean at all but not take that risk whatsoever. In regard to the pole I think there is still a inherent risk here and that was discussed with him as well. With that being said we also did discuss the possibility of next care bandages as he seems fairly determined that he is going to swim while he is on vacation at the beach. Electronic Signature(s) Signed: 02/01/2020 3:00:00 PM By: Worthy Keeler PA-C Entered By: Worthy Keeler on 02/01/2020 14:59:59 Kosloski, Norva Riffle ([id number]) -------------------------------------------------------------------------------- Physical Exam Details Patient Name: Robert Castro Date of Service: 02/01/2020 1:30 PM Medical Record Number: [id number] Patient Account Number: [id number] Date of Birth/Sex: 1950/11/08 (69 y.o. M) Treating RN: Army Melia Primary Care Provider:  Bing Matter Other Clinician: Referring Provider: Bing Matter Treating Provider/Extender: STONE III, Tell Rozelle Weeks in Treatment: 3 Constitutional Well-nourished and well-hydrated in no acute distress. Respiratory normal breathing without difficulty. Psychiatric this patient is able to make decisions and demonstrates good insight into disease process. Alert and Oriented x 3. pleasant and cooperative. Notes Upon inspection patient's wound did require sharp debridement to clear away some of the callus down to good subcutaneous level at this point. He had minimal bleeding noted and to be honest wound actually appears to be doing quite well. With that being said I do not believe that he is completely closed and I do not think this is really safe for him to just be in the ocean at all and to be honest even a prolonged somewhat reluctant to tell him he should do. With that being said I do have some recommendations for him considering that he seems to be fairly adamant about the fact that he is going to get in the pool. Electronic Signature(s) Signed: 02/01/2020 3:00:33 PM By: Worthy Keeler PA-C Entered By: Worthy Keeler on 02/01/2020 15:00:33 Emminger, Norva Riffle ([id number]) -------------------------------------------------------------------------------- Physician Orders Details Patient Name: Robert Castro Date of Service: 02/01/2020 1:30 PM Medical Record Number: [id number] Patient Account Number: [id number] Date of Birth/Sex: December 03, 1950 (69 y.o. M) Treating RN: Army Melia Primary Care Provider: Bing Matter Other Clinician: Referring Provider: Bing Matter Treating Provider/Extender: Melburn Hake, Elaina Cara Weeks in Treatment: 3 Verbal / Phone Orders: No Diagnosis Coding ICD-10 Coding Code Description E11.621 Type 2 diabetes mellitus with foot ulcer L97.512 Non-pressure chronic ulcer of other part of right foot with fat layer exposed E11.42 Type 2 diabetes mellitus with diabetic  polyneuropathy Wound Cleansing Wound #1 Right,Plantar Toe Great o Clean wound with Normal Saline. Anesthetic (add to Medication List) Wound #1 Right,Plantar Toe Great o Topical Lidocaine 4% cream applied to wound bed prior to  debridement (In Clinic Only). Primary Wound Dressing Wound #1 Right,Plantar Toe Great o Silver Collagen Secondary Dressing Wound #1 Right,Plantar Toe Great o Gauze and Kerlix/Conform Dressing Change Frequency Wound #1 Right,Plantar Toe Great o Change Dressing Monday, Wednesday, Friday Follow-up Appointments o Return Appointment in 2 weeks. Off-Loading Wound #1 Right,Plantar Toe Great o Open toe surgical shoe - Right Additional Orders / Instructions o Activity as tolerated Electronic Signature(s) Signed: 02/01/2020 4:06:31 PM By: Army Melia Signed: 02/01/2020 4:20:46 PM By: Worthy Keeler PA-C Entered By: Army Melia on 02/01/2020 14:26:38 Robert Castro ([id number]) -------------------------------------------------------------------------------- Problem List Details Patient Name: Robert Castro Date of Service: 02/01/2020 1:30 PM Medical Record Number: [id number] Patient Account Number: [id number] Date of Birth/Sex: 1951/04/10 (69 y.o. M) Treating RN: Army Melia Primary Care Provider: Bing Matter Other Clinician: Referring Provider: Bing Matter Treating Provider/Extender: Melburn Hake, Delores Thelen Weeks in Treatment: 3 Active Problems ICD-10 Encounter Code Description Active Date MDM Diagnosis E11.621 Type 2 diabetes mellitus with foot ulcer 01/06/2020 No Yes L97.512 Non-pressure chronic ulcer of other part of right foot with fat layer 01/06/2020 No Yes exposed E11.42 Type 2 diabetes mellitus with diabetic polyneuropathy 01/06/2020 No Yes Inactive Problems Resolved Problems Electronic Signature(s) Signed: 02/01/2020 1:50:32 PM By: Worthy Keeler PA-C Entered By: Worthy Keeler on 02/01/2020 13:50:31 Behrens, Norva Riffle  ([id number]) -------------------------------------------------------------------------------- Progress Note Details Patient Name: Robert Castro Date of Service: 02/01/2020 1:30 PM Medical Record Number: [id number] Patient Account Number: [id number] Date of Birth/Sex: 01/02/1951 (69 y.o. M) Treating RN: Army Melia Primary Care Provider: Bing Matter Other Clinician: Referring Provider: Bing Matter Treating Provider/Extender: Melburn Hake, Jakevious Hollister Weeks in Treatment: 3 Subjective Chief Complaint Information obtained from Patient 01/06/2020; patient is here for review of a wound on the plantar first toe over the proximal phalanx History of Present Illness (HPI) ADMISSION 01/06/2020 Patient is a 69 year old man with multiple medical problems. Among them he is type 2 diabetes with peripheral neuropathy. He is here for review of an area on the right first plantar toe over the proximal phalanx. He says he got this about 3 weeks ago when he was stepping out of the hot tub he stepped on a rock. He says he did not even feel anything was wrong. He went to see podiatry and he was prescribed Santyl although he has not yet started this. He was using mupirocin prior to this after seeing his primary doctor on 4/26. 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 Still clean on the plantar right great toe we use  silver collagen 01/20/20-Patient comes at 1 week, the wound is improving, there is very minimal callus around the rim 02/01/2020 upon evaluation today patient appears to be doing fairly well in regard to his great toe ulcer. Fortunately there is no signs of active infection at this time. No fever chills noted. Fortunately he seems to be making good progress currently which is great news. With that being said I did discuss with the patient today that unfortunately he is not completely healed and I still believe that he needs to be very  cautious in regard to swimming he is going to the [employer]. I explained that he really should not be in the ocean at all but not take that risk whatsoever. In regard to the pole I think there is still a inherent risk here and that was discussed with him as well. With that being said we also did discuss the possibility of next care bandages as he seems fairly determined that he is going to swim while he is on vacation at the beach. Robert Castro ([id number]) Objective Constitutional Well-nourished and well-hydrated in no acute distress. Vitals Time Taken: 1:55 PM, Height: 71 in, Weight: 245 lbs, BMI: 34.2, Temperature: 98.2 F, Pulse: 87 bpm, Respiratory Rate: 18 breaths/min, Blood Pressure: 134/76 mmHg. Respiratory normal breathing without difficulty. Psychiatric this patient is able to make decisions and demonstrates good insight into disease process. Alert and Oriented x 3. pleasant and cooperative. General Notes: Upon inspection patient's wound did require sharp debridement to clear away some of the callus down to good subcutaneous level at this point. He had minimal bleeding noted and to be honest wound actually appears to be doing quite well. With that being said I do not believe that he is completely closed and I do not think this is really safe for him to just be in the ocean at all and to be honest even a prolonged somewhat reluctant to tell him he  should do. With that being said I do have some recommendations for him considering that he seems to be fairly adamant about the fact that he is going to get in the pool. Integumentary (Hair, Skin) Wound #1 status is Open. Original cause of wound was Gradually Appeared. The wound is located on the [employer]. The wound measures 0.1cm length x 0.1cm width x 0.1cm depth; 0.008cm^2 area and 0.001cm^3 volume. There is Fat Layer (Subcutaneous Tissue) Exposed exposed. There is no tunneling or undermining noted. There is a none present amount of drainage noted. The wound margin is flat and intact. There is no granulation within the wound bed. There is a large (67-100%) amount of necrotic tissue within the wound bed including Adherent Slough. Assessment Active Problems ICD-10 Type 2 diabetes mellitus with foot ulcer Non-pressure chronic ulcer of other part of right foot with fat layer exposed Type 2 diabetes mellitus with diabetic polyneuropathy Procedures Wound #1 Pre-procedure diagnosis of Wound #1 is a Diabetic Wound/Ulcer of the Lower Extremity located on the Right,Plantar Toe Great .Severity of Tissue Pre Debridement is: Fat layer exposed. There was a Excisional Skin/Subcutaneous Tissue Debridement with a total area of 0.09 sq cm performed by STONE III, Iniya Matzek E., PA-C. With the following instrument(s): Curette to remove Viable and Non-Viable tissue/material. Material removed includes Callus and Subcutaneous Tissue and after achieving pain control using Lidocaine. A time out was conducted at 14:20, prior to the start of the procedure. There was no bleeding. The procedure was tolerated well. Post Debridement Measurements: 0.3cm length x 0.3cm width x 0.3cm depth; 0.021cm^3 volume. Character of Wound/Ulcer Post Debridement is stable. Severity of Tissue Post Debridement is: Fat layer exposed. Post procedure Diagnosis Wound #1: Same as Pre-Procedure Plan Wound Cleansing: Wound #1  Right,Plantar Toe Great: Clean wound with Normal Saline. Anesthetic (add to Medication List): Wound #1 Right,Plantar Toe Great: Topical Lidocaine 4% cream applied to wound bed prior to debridement (In Clinic Only). Primary Wound Dressing: Wound #1 Right,Plantar Toe Great: Silver Collagen ERLAND, VIVAS ([id number]) Secondary Dressing: Wound #1 Right,Plantar Toe Great: Gauze and  Kerlix/Conform Dressing Change Frequency: Wound #1 Right,Plantar Toe Great: Change Dressing Monday, Wednesday, Friday Follow-up Appointments: Return Appointment in 2 weeks. Off-Loading: Wound #1 Right,Plantar Toe Great: Open toe surgical shoe - Right Additional Orders / Instructions: Activity as tolerated 1. Currently on recommend we continue with the silver collagen I think that is doing best for him. 2. I am also can recommend at this time that we continue with his offloading shoe he needs to really be wearing this all the time. 3. I suggested since he seems like he is going to try to swing no matter what that as opposed to using rubber bands around the end of his toe with a plastic Ziploc bag he cut to fit in that area I really think he would be better off trying a next care bandage to see if that would help maintain the moisture away from his wound while he is swimming. With that being said I explained that he needs to be checking this regularly and even then there is still an inherent risk in getting this wet in the pool as far as infection is concerned and overall worsening of the wound. I suggest that he really not swelling at all but nonetheless these are the recommendations I would have if he plans to do it either way. He also should be wearing swim shoes to try to protect his feet no matter what at all times. We will see patient back for reevaluation in 1 week here in the clinic. If anything worsens or changes patient will contact our office for additional recommendations. Electronic  Signature(s) Signed: 02/01/2020 3:02:05 PM By: Worthy Keeler PA-C Entered By: Worthy Keeler on 02/01/2020 15:02:05 Robert Castro ([id number]) -------------------------------------------------------------------------------- SuperBill Details Patient Name: Robert Castro Date of Service: 02/01/2020 Medical Record Number: [id number] Patient Account Number: [id number] Date of Birth/Sex: 1951/02/11 (69 y.o. M) Treating RN: Army Melia Primary Care Provider: Bing Matter Other Clinician: Referring Provider: Bing Matter Treating Provider/Extender: Melburn Hake, Burnell Matlin Weeks in Treatment: 3 Diagnosis Coding ICD-10 Codes Code Description E11.621 Type 2 diabetes mellitus with foot ulcer L97.512 Non-pressure chronic ulcer of other part of right foot with fat layer exposed E11.42 Type 2 diabetes mellitus with diabetic polyneuropathy Facility Procedures CPT4 Code: [id number] Description: 45364 - DEB SUBQ TISSUE 20 SQ CM/< Modifier: Quantity: 1 CPT4 Code: Description: ICD-10 Diagnosis Description L97.512 Non-pressure chronic ulcer of other part of right foot with fat layer ex Modifier: posed Quantity: Physician Procedures CPT4 Code: 6803212 Description: 11042 - WC PHYS SUBQ TISS 20 SQ CM Modifier: Quantity: 1 CPT4 Code: Description: ICD-10 Diagnosis Description L97.512 Non-pressure chronic ulcer of other part of right foot with fat layer ex Modifier: posed Quantity: Electronic Signature(s) Signed: 02/01/2020 3:02:15 PM By: Worthy Keeler PA-C Entered By: Worthy Keeler on 02/01/2020 15:02:15

## 2020-02-03 ENCOUNTER — Ambulatory Visit: Payer: Medicare Other | Admitting: Internal Medicine

## 2020-02-10 ENCOUNTER — Ambulatory Visit (INDEPENDENT_AMBULATORY_CARE_PROVIDER_SITE_OTHER): Payer: Medicare Other | Admitting: Gastroenterology

## 2020-02-10 ENCOUNTER — Telehealth: Payer: Self-pay | Admitting: Hematology

## 2020-02-10 ENCOUNTER — Encounter: Payer: Self-pay | Admitting: Hematology

## 2020-02-10 VITALS — BP 146/84 | HR 89 | Ht 70.0 in | Wt 262.0 lb

## 2020-02-10 DIAGNOSIS — Z86010 Personal history of colonic polyps: Secondary | ICD-10-CM | POA: Diagnosis not present

## 2020-02-10 DIAGNOSIS — D132 Benign neoplasm of duodenum: Secondary | ICD-10-CM | POA: Diagnosis not present

## 2020-02-10 DIAGNOSIS — K760 Fatty (change of) liver, not elsewhere classified: Secondary | ICD-10-CM | POA: Diagnosis not present

## 2020-02-10 DIAGNOSIS — D582 Other hemoglobinopathies: Secondary | ICD-10-CM | POA: Diagnosis not present

## 2020-02-10 NOTE — Telephone Encounter (Signed)
Received a new hem referral from Dr. Havery Moros for elevated hgb. Mr. Robert Castro returned my call and has been scheduled to see Dr. Irene Limbo on 6/28 at 1pm. Pt aware to arrive 15 minutes early.

## 2020-02-10 NOTE — Patient Instructions (Addendum)
If you are age 71 or older, your body mass index should be between 23-30. Your Body mass index is 37.59 kg/m. If this is out of the aforementioned range listed, please consider follow up with your Primary Care Provider.  If you are age 72 or younger, your body mass index should be between 19-25. Your Body mass index is 37.59 kg/m. If this is out of the aformentioned range listed, please consider follow up with your Primary Care Provider.   You have been scheduled for an endoscopy. Please follow written instructions given to you at your visit today. If you use inhalers (even only as needed), please bring them with you on the day of your procedure. ______________________________________________________________   Dennis Bast have been scheduled for an abdominal ultrasound at [facility] (1st floor of hospital) on Monday, 02-15-20 at 11:00am. Please arrive 15 minutes prior to your appointment for registration. Make certain not to have anything to eat or drink 6 hours prior to your appointment. Should you need to reschedule your appointment, please contact radiology at [phone]. This test typically takes about 30 minutes to perform.  _____________________________________________________________  We would like to see you for a follow up in 6 months.  We will remind you when it is time to schedule your appointment.   Thank you for entrusting me with your care and for choosing [facility], Dr. [place] Cellar

## 2020-02-10 NOTE — Progress Notes (Signed)
 HPI :  69-year-old male here for follow-up visit for elevations in liver enzymes, history of colon polyps, history of duodenal adenomas.  He has had elevations in his liver enzymes over the years, serologic work-up for chronic liver diseases has been negative.  He has had imaging showing fatty liver without cirrhosis previously.  He has had history of diabetes for which she sees endocrinology, he also uses alcohol routinely, anywhere from 2-6 mixed beverages per day.  We have previously discussed alcohol cessation and he has considerably cut back at most 2-3 drinks per day, but he has had difficulty with stopping.  His ALT has been greater than AST over time.  Has been vaccinated to [employer].  He has had imaging with ultrasound and CT scan, last done over a year ago, showing no evidence of cirrhosis.  Over the past year however has platelet count has dropped to the 130s.  He is concerned about the possibility for cirrhosis.  He has not had any history of ascites or decompensating liver disease  He has a history of a large duodenal adenoma treated at [facility] by Dr. Burbridge in February 2019, and then a follow-up EGD there showed no evidence of recurrence.  She had recommended a follow-up EGD in 1 year after that exam.  He was seen here and had plan to schedule that with us but has not done so yet.  He otherwise was noted to have a hemoglobin in the 8 teens on recent blood work.  This followed up his last CBC which showed hemoglobin of 17's.  He has been referred to hematology but has not seen them yet.  Otherwise had a colonoscopy 02/2018. He had 21 polyps removed, 20 of which were considered adenomatous. Referred him to a genetic counselor and had lab testing done. Variant of unclear significance noted, with ATM gene. Follow up colonoscopy done August of this year showed 6 diminutive polyps, due for a follow up exam in 3 years.    Otherwise feeling okay, he has multiple orthopedic complaints,  specifically right shoulder that bothers him.  He has a diabetic foot ulcer that he has been seen by the wound clinic for. His weight has been stable.   Prior evaluation US done 09/11/2017 - fatty infiltration, multiple gallstones, non larger than 5mm, no obvious cirrhosis  Colonoscopy 03/20/2018 - cecal AVM, 21 polyps removed, fair prep, diverticulosis - 20 polyps adenoma, referred to genetic counselor  EGD 04/15/2018 - duodenal scar with small nodule at edge - biopsies done - no residual adenoma EGD 10/16/17 - Dr. Burbridge - piecemal resection of duodenal adenoma via EMR - path c/w adenoma EGD 10/07/2017 - [facility], stomach filled with food, procedure aborted EGD 09/16/2017 - polypectomy was planned but procedure aborted due to size of polyp and spasm, referred to [facility]  EGD 07/15/2017 - irregular z-line, multiple gastric polyps, duodenal polyp,  Colonoscopy 09/18/2012 - small polyps, diverticulosis, one TA - recall in 5 years  Colonoscopy 04/24/19 - The perianal and digital rectal examinations were normal. - A single large angiodysplastic lesion was found in the cecum. - A diminutive polyp was found in the cecum. The polyp was sessile. The polyp was removed with a cold snare. Resection and retrieval were complete. - A 3 mm polyp was found in the hepatic flexure. The polyp was sessile. The polyp was removed with a cold snare. Resection and retrieval were complete. - Four sessile polyps were found in the transverse colon. The polyps were 3 to 4   mm in size. These polyps were removed with a cold snare. Resection and retrieval were complete. - Multiple small-mouthed diverticula were found in the left colon. - The colon was extremely spastic which prolonged the procedure. - The exam was otherwise without abnormality.  Path c/w adenomas - repeat colonoscopy in 3 years   Past Medical History:  Diagnosis Date  . Adenomatous duodenal polyp   . Arthritis   . Barrett's esophagus   . Benign  enlargement of prostate   . Cataract    both eyes  . 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 Depression   . Diabetes ([place])   . Diverticulosis   . Fatty liver   . GERD (gastroesophageal reflux disease)   . HOH (hard of hearing)   . Hyperlipidemia   . Hypertension   . Hypothyroidism   . Internal hemorrhoids   . Leg edema, left   . Macular degeneration   . Neuromuscular disorder ([place])   . Osteoarthritis   . Sleep apnea    cpap  . Testosterone deficiency   . Tremors of nervous system   . Umbilical hernia      Past Surgical History:  Procedure Laterality Date  . BACK SURGERY  2009  . BACK SURGERY  09/2014   Dr Marykay Lex  . CATARACT EXTRACTION, BILATERAL  12/2016  . ESOPHAGOGASTRODUODENOSCOPY (EGD) WITH PROPOFOL N/A 09/16/2017   Procedure: ESOPHAGOGASTRODUODENOSCOPY (EGD) WITH PROPOFOL;  Surgeon: Yetta Flock, MD;  Location: [facility];  Service: Gastroenterology;  Laterality: N/A;  . LUMBAR LAMINECTOMY/ DECOMPRESSION WITH MET-RX  2014  . TONSILLECTOMY  1956  . UMBILICAL HERNIA REPAIR     Family History  Problem Relation Age of Onset  . Atrial fibrillation Brother   . Cancer Mother 51       abdominal cancer, unk name of cancer type  . Heart attack Maternal Grandfather   . Heart attack Paternal Grandfather   . Colon cancer Neg Hx   . Stomach cancer Neg Hx   . Colon polyps Neg Hx   . Esophageal cancer Neg Hx   . Rectal cancer Neg Hx    Social History   Tobacco Use  . Smoking status: Former Smoker    Quit date: 08/25/2002    Years since quitting: 17.4  . Smokeless tobacco: Never Used  . Tobacco comment: in college only 40 years ago  Vaping Use  . Vaping Use: Never used  Substance Use Topics  . Alcohol use: Yes    Alcohol/week: 14.0 - 28.0 standard drinks    Types: 14 - 28 Standard drinks or equivalent per week    Comment: varies  daily drinker  . Drug use: No   Current Outpatient Medications  Medication Sig Dispense  Refill  . ALPHA LIPOIC ACID PO Take 400 mg by mouth daily.    [name] amLODipine (NORVASC) 10 MG tablet Take 10 mg Daily by mouth.     [name] anastrozole (ARIMIDEX) 1 MG tablet Take 1 mg daily by mouth.    [name] ascorbic acid (VITAMIN C) 500 MG tablet Take 1,000 mg by mouth daily.    [name] aspirin EC 81 MG tablet Take 81 mg by mouth daily.    [name] atorvastatin (LIPITOR) 10 MG tablet Take 10 mg by mouth daily.     [name] buPROPion (WELLBUTRIN XL) 300 MG 24 hr tablet Take 300 mg by mouth Daily.     . calcium-vitamin D (OSCAL WITH D) 250-125 MG-UNIT tablet  Take 1 tablet by mouth daily.    . chlorhexidine (PERIDEX) 0.12 % solution Use as directed 10 mLs in the mouth or throat 2 (two) times daily as needed.   0  . Cholecalciferol (VITAMIN D-1000 MAX ST) 1000 units tablet Take 1,000 Units by mouth daily.     . diazepam (VALIUM) 5 MG tablet Take 5-10 mg by mouth every 6 (six) hours as needed (for spasms).     . diclofenac (VOLTAREN) 75 MG EC tablet Take 75 mg by mouth 2 (two) times daily as needed (for pain.).     . Dulaglutide (TRULICITY) 1.5 MG/0.5ML SOPN Inject into the skin.    . fluocinonide ointment (LIDEX) 0.05 % Apply 1 application topically 2 (two) times daily as needed (IRRITATION).    . fluticasone (FLONASE) 50 MCG/ACT nasal spray Place 1 spray into both nostrils daily as needed for allergies or rhinitis.    . JARDIANCE 10 MG TABS tablet Take 10 mg by mouth daily.    . Lancets (ONETOUCH ULTRASOFT) lancets 1 each by Misc.(Non-Drug; Combo Route) route daily. Dx E11.9    . metFORMIN (GLUCOPHAGE-XR) 500 MG 24 hr tablet Take 1,000 mg by mouth 2 (two) times daily.    . MILK THISTLE PO Take 1 capsule by mouth daily.    . Multiple Vitamins-Minerals (PRESERVISION AREDS 2 PO) Take 1 capsule by mouth 2 (two) times daily.     . Multiple Vitamins-Minerals (PRESERVISION AREDS 2 PO) Take by mouth.    . olmesartan (BENICAR) 20 MG tablet Take 20 mg by mouth daily.    . Omega-3 Fatty Acids (FISH OIL) 1200 MG CAPS Take 1 capsule by  mouth daily.     . omeprazole (PRILOSEC) 20 MG capsule Take 20 mg by mouth daily.    . sertraline (ZOLOFT) 100 MG tablet Take 200 mg by mouth daily.     . testosterone cypionate (DEPOTESTOTERONE CYPIONATE) 200 MG/ML injection Inject 200 mg into the muscle every 14 (fourteen) days.   1  . Thiamine HCl (B-1 PO) Take 300 mg by mouth daily.    . thyroid (ARMOUR) 60 MG tablet Take 60 mg by mouth daily.    . traMADol (ULTRAM) 50 MG tablet Take 50 mg by mouth every 6 (six) hours as needed (for pain.).     . TURMERIC PO Take 1,000 mg by mouth daily.     . zolpidem (AMBIEN) 10 MG tablet Take 10 mg by mouth daily as needed. For sleep     No current facility-administered medications for this visit.   No Known Allergies   Review of Systems: All systems reviewed and negative except where noted in HPI.   Lab Results  Component Value Date   WBC 4.9 01/27/2020   HGB 18.4 Repeated and verified X2. (HH) 01/27/2020   HCT 54.0 (H) 01/27/2020   MCV 98.0 01/27/2020   PLT 134.0 (L) 01/27/2020    Lab Results  Component Value Date   CREATININE 1.20 12/18/2019   BUN 23 12/18/2019   NA 140 12/18/2019   K 4.1 12/18/2019   CL 101 12/18/2019   CO2 30 12/18/2019    Lab Results  Component Value Date   ALT 59 (H) 12/18/2019   ALT 59 (H) 12/18/2019   AST 37 12/18/2019   AST 37 12/18/2019   ALKPHOS 95 12/18/2019   ALKPHOS 95 12/18/2019   BILITOT 1.0 12/18/2019   BILITOT 1.0 12/18/2019    No results found for: INR, PROTIME   Physical Exam: BP (!)   146/84   Pulse 89   Ht 5' 10" (1.778 m)   Wt 262 lb (118.8 kg)   BMI 37.59 kg/m  Constitutional: Pleasant,well-developed, male in no acute distress. HEENT: Normocephalic and atraumatic. Conjunctivae are normal. No scleral icterus. Neck supple.  Cardiovascular: Normal rate, regular rhythm.  Pulmonary/chest: Effort normal and breath sounds normal. No wheezing, rales or rhonchi. Abdominal: Soft, protuberant, nontender.  Extremities: no  edema Lymphadenopathy: No cervical adenopathy noted. Neurological: Alert and oriented to person place and time. Skin: Skin is warm and dry. No rashes noted. Psychiatric: Normal mood and affect. Behavior is normal.   ASSESSMENT AND PLAN: 69 year old male here for reassessment of the following:  Fatty liver - noted on imaging with ultrasound and CT scan without evidence of cirrhosis.  ALT greater than AST, suspect this is combination from being overweight with diabetes as well as with routine alcohol use.  I have discussed long-term risks of fatty liver with him and risks for cirrhosis.  I have recommended abstinence from alcohol but he is not able to do that but has decreased his use significantly.  His platelet count has slightly drifted over the past year.  I offered him an ultrasound to screen for cirrhosis which he is quite concerned about, so we will proceed with that.  Otherwise we will check his labs periodically he will see me every 6 months to a year for this.  History of duodenal adenoma - as above, he is overdue for surveillance EGD, last done at [facility] a few years ago who recommended a 1 year follow-up exam.  I discussed risk and benefits of EGD and anesthesia with him and he wants to proceed.  Further recommendations pending results.  Elevated hemoglobin - hemoglobin elevated persistently over the past few months, now mid 18's.  I have referred him to hematology for this  History of colon adenomas- 26 adenomas removed on his last 2 exams. Genetic testing without any clear cause although does have a variance of unknown significance in ATM gene. Will plan on surveillance colonoscopy 3 years from his last exam given no high risk lesions on this most recent colonoscopy.   Virgil Cellar, MD [facility]

## 2020-02-11 ENCOUNTER — Other Ambulatory Visit: Payer: Self-pay

## 2020-02-11 ENCOUNTER — Encounter: Payer: Medicare Other | Admitting: Physician Assistant

## 2020-02-11 DIAGNOSIS — E11621 Type 2 diabetes mellitus with foot ulcer: Secondary | ICD-10-CM | POA: Diagnosis not present

## 2020-02-11 NOTE — Progress Notes (Addendum)
JENESIS, SUCHY ([id number]) Visit Report for 02/11/2020 Chief Complaint Document Details Patient Name: Robert Castro, Robert Castro Date of Service: 02/11/2020 3:30 PM Medical Record Number: [id number] Patient Account Number: [id number] Date of Birth/Sex: October 23, 1950 (68 y.o. M) Treating RN: Army Melia Primary Care Provider: Bing Matter Other Clinician: Referring Provider: Bing Matter Treating Provider/Extender: Melburn Hake, Maryon Kemnitz Weeks in Treatment: 5 Information Obtained from: Patient Chief Complaint 01/06/2020; patient is here for review of a wound on the plantar first toe over the proximal phalanx Electronic Signature(s) Signed: 02/11/2020 3:56:39 PM By: Worthy Keeler PA-C Entered By: Worthy Keeler on 02/11/2020 15:56:38 Ramnauth, Norva Riffle ([id number]) -------------------------------------------------------------------------------- HPI Details Patient Name: Robert Castro Date of Service: 02/11/2020 3:30 PM Medical Record Number: [id number] Patient Account Number: [id number] Date of Birth/Sex: 04-Nov-1950 (68 y.o. M) Treating RN: Army Melia Primary Care Provider: Bing Matter Other Clinician: Referring Provider: Bing Matter Treating Provider/Extender: Melburn Hake, Zaylon Bossier Weeks in Treatment: 5 History of Present Illness HPI Description: ADMISSION 01/06/2020 Patient is a 69 year old man with multiple medical problems. Among them he is type 2 diabetes with peripheral neuropathy. He is here for review of an area on the right first plantar toe over the proximal phalanx. He says he got this about 3 weeks ago when he was stepping out of the hot tub he stepped on a rock. He says he did not even feel anything was wrong. He went to see podiatry and he was prescribed Santyl although he has not yet started this. He was using mupirocin prior to this after seeing his primary doctor on 4/26. 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 Still clean on the plantar right great toe we use silver collagen 01/20/20-Patient comes at 1 week, the wound is improving, there is very minimal callus around the rim 02/01/2020 upon evaluation today patient appears to be doing fairly well in regard to his great toe ulcer. Fortunately there is no signs of active infection at this time. No fever chills noted. Fortunately he seems to be making good progress currently which is great news. With that being said I did discuss with the patient today that unfortunately he is not completely healed and I still believe that he needs to be very cautious in regard to swimming he is going to the beach tomorrow. I explained that he really should not be in the ocean at all but not take that risk whatsoever. In regard to the pole I think there is still a inherent risk here and that was discussed with him as well. With that being said we also did discuss the possibility of next care bandages as he seems fairly determined that he is going to swim while he is on vacation at the beach. 02/11/2020 upon evaluation today patient appears to  be doing excellent. He is actually completely healed and I am very pleased that this has done so well. Fortunately there is no signs of active infection at this time. No fevers, chills, nausea,  vomiting, or diarrhea. Electronic Signature(s) Signed: 02/11/2020 4:19:22 PM By: Worthy Keeler PA-C Entered By: Worthy Keeler on 02/11/2020 16:19:22 Arcilla, Norva Riffle ([id number]) -------------------------------------------------------------------------------- Physical Exam Details Patient Name: Robert Castro Date of Service: 02/11/2020 3:30 PM Medical Record Number: [id number] Patient Account Number: [id number] Date of Birth/Sex: 16-Sep-1950 (68 y.o. M) Treating RN: Army Melia Primary Care Provider: Bing Matter Other Clinician: Referring Provider: Bing Matter Treating Provider/Extender: STONE III, Fitzroy Mikami Weeks in Treatment: 5 Constitutional Well-nourished and well-hydrated in no acute distress. Respiratory normal breathing without difficulty. Psychiatric this patient is able to make decisions and demonstrates good insight into disease process. Alert and Oriented x 3. pleasant and cooperative. Notes Patient's wound bed currently showed signs of good epithelization at this time there does not appear to be any evidence of active infection which is great news overall I am extremely pleased with where things stand. The patient tells me that he is actually doing quite well currently. Electronic Signature(s) Signed: 02/11/2020 4:19:48 PM By: Worthy Keeler PA-C Entered By: Worthy Keeler on 02/11/2020 16:19:48 Traeger, Norva Riffle ([id number]) -------------------------------------------------------------------------------- Physician Orders Details Patient Name: Robert Castro Date of Service: 02/11/2020 3:30 PM Medical Record Number: [id number] Patient Account Number: [id number] Date of Birth/Sex: [birth year] (68 y.o. M) Treating RN: Army Melia Primary Care Provider: Bing Matter Other Clinician: Referring Provider: Bing Matter Treating Provider/Extender: Melburn Hake, Sokha Craker Weeks in Treatment: 5 Verbal / Phone Orders: No Diagnosis Coding ICD-10 Coding Code  Description E11.621 Type 2 diabetes mellitus with foot ulcer L97.512 Non-pressure chronic ulcer of other part of right foot with fat layer exposed E11.42 Type 2 diabetes mellitus with diabetic polyneuropathy Discharge From [facility] Services o Discharge from [place] complete Electronic Signature(s) Signed: 02/11/2020 4:21:54 PM By: Worthy Keeler PA-C Signed: 02/17/2020 10:25:16 AM By: Army Melia Entered By: Army Melia on 02/11/2020 16:01:08 Robert Castro ([id number]) -------------------------------------------------------------------------------- Problem List Details Patient Name: Robert Castro Date of Service: 02/11/2020 3:30 PM Medical Record Number: [id number] Patient Account Number: [id number] Date of Birth/Sex: 1950-12-31 (68 y.o. M) Treating RN: Army Melia Primary Care Provider: Bing Matter Other Clinician: Referring Provider: Bing Matter Treating Provider/Extender: Melburn Hake, Kelita Wallis Weeks in Treatment: 5 Active Problems ICD-10 Encounter Code Description Active Date MDM Diagnosis E11.621 Type 2 diabetes mellitus with foot ulcer 01/06/2020 No Yes L97.512 Non-pressure chronic ulcer of other part of right foot with fat layer 01/06/2020 No Yes exposed E11.42 Type 2 diabetes mellitus with diabetic polyneuropathy 01/06/2020 No Yes Inactive Problems Resolved Problems Electronic Signature(s) Signed: 02/11/2020 3:56:27 PM By: Worthy Keeler PA-C Entered By: Worthy Keeler on 02/11/2020 15:56:25 Duchene, Norva Riffle ([id number]) -------------------------------------------------------------------------------- Progress Note Details Patient Name: Robert Castro Date of Service: 02/11/2020 3:30 PM Medical Record Number: [id number] Patient Account Number: [id number] Date of Birth/Sex: 20-Feb-1951 (68 y.o. M) Treating RN: Army Melia Primary Care Provider: Bing Matter Other Clinician: Referring Provider: Bing Matter Treating Provider/Extender:  Melburn Hake, Yassen Kinnett Weeks in Treatment: 5 Subjective Chief Complaint Information obtained from Patient 01/06/2020; patient is here for review of a wound on the plantar first toe over the proximal phalanx History of Present Illness (HPI) ADMISSION 01/06/2020 Patient is a 69 year old man with multiple medical problems. Among them he is type 2 diabetes with peripheral neuropathy. He is here  for review of an area on the right first plantar toe over the proximal phalanx. He says he got this about 3 weeks ago when he was stepping out of the hot tub he stepped on a rock. He says he did not even feel anything was wrong. He went to see podiatry and he was prescribed Santyl although he has not yet started this. He was using mupirocin prior to this after seeing his primary doctor on 4/26. 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 Still clean on the plantar right great toe we use silver collagen 01/20/20-Patient comes at 1 week, the wound is improving, there is very minimal callus around the rim 02/01/2020 upon evaluation today patient appears to be doing fairly well in regard to his great toe ulcer. Fortunately there is no signs of active infection at this time. No fever chills noted. Fortunately he seems to be making good progress currently which is great news. With that being said I did discuss with the patient today  that unfortunately he is not completely healed and I still believe that he needs to be very cautious in regard to swimming he is going to the beach tomorrow. I explained that he really should not be in the ocean at all but not take that risk whatsoever. In regard to the pole I think there is still a inherent risk here and that was discussed with him as well. With that being said we also did discuss the possibility of next care bandages as he seems fairly determined that he is going to swim while he is on vacation at the beach. 02/11/2020 upon evaluation today patient appears to be doing excellent. He is actually completely healed and I am very pleased that this has done so well. Fortunately there is no signs of active infection at this time. No fevers, chills, nausea, vomiting, or diarrhea. Robert Castro ([id number]) Objective Constitutional Well-nourished and well-hydrated in no acute distress. Vitals Time Taken: 3:53 PM, Height: 71 in, Weight: 245 lbs, BMI: 34.2, Temperature: 97.8 F, Pulse: 106 bpm, Respiratory Rate: 18 breaths/min, Blood Pressure: 153/87 mmHg. Respiratory normal breathing without difficulty. Psychiatric this patient is able to make decisions and demonstrates good insight into disease process. Alert and Oriented x 3. pleasant and cooperative. General Notes: Patient's wound bed currently showed signs of good epithelization at this time there does not appear to be any evidence of active infection which is great news overall I am extremely pleased with where things stand. The patient tells me that he is actually doing quite well currently. Integumentary (Hair, Skin) Wound #1 status is Healed - Epithelialized. Original cause of wound was Gradually Appeared. The wound is located on the [employer]. The wound measures 0cm length x 0cm width x 0cm depth; 0cm^2 area and 0cm^3 volume. The wound is limited to skin breakdown. There is no tunneling or undermining noted.  There is a none present amount of drainage noted. The wound margin is flat and intact. There is no granulation within the wound bed. There is a large (67-100%) amount of necrotic  tissue within the wound bed including Adherent Slough. Assessment Active Problems ICD-10 Type 2 diabetes mellitus with foot ulcer Non-pressure chronic ulcer of other part of right foot with fat layer exposed Type 2 diabetes mellitus with diabetic polyneuropathy Plan Discharge From [facility] Services: Discharge from [place] - Treatment complete 1. At this point the patient appears to be completely healed. I am extremely pleased with the fact that he is doing so well. 2. With regard to protection I would recommend that for the next week he use a bandage during the day just to protect his foot and then at nighttime he can take this off and leave the area open to air there is no wound openings I think he will be just fine in this regard. We will see the patient back for follow-up visit as needed if anything changes or worsens. Electronic Signature(s) Signed: 02/11/2020 4:20:43 PM By: Worthy Keeler PA-C Entered By: Worthy Keeler on 02/11/2020 16:20:43 Weltz, Norva Riffle ([id number]) -------------------------------------------------------------------------------- SuperBill Details Patient Name: Robert Castro Date of Service: 02/11/2020 Medical Record Number: [id number] Patient Account Number: [id number] Date of Birth/Sex: 07/17/1951 (68 y.o. M) Treating RN: Army Melia Primary Care Provider: Bing Matter Other Clinician: Referring Provider: Bing Matter Treating Provider/Extender: Melburn Hake, Clancy Leiner Weeks in Treatment: 5 Diagnosis Coding ICD-10 Codes Code Description E11.621 Type 2 diabetes mellitus with foot ulcer L97.512 Non-pressure chronic ulcer of other part of right foot with fat layer exposed E11.42 Type 2 diabetes mellitus with diabetic polyneuropathy Facility Procedures CPT4 Code:  [id number] Description: 99213 - WOUND CARE VISIT-LEV 3 EST PT Modifier: Quantity: 1 Physician Procedures CPT4 Code: [id number] Description: 99213 - WC PHYS LEVEL 3 - EST PT Modifier: Quantity: 1 CPT4 Code: Description: ICD-10 Diagnosis Description E11.621 Type 2 diabetes mellitus with foot ulcer L97.512 Non-pressure chronic ulcer of other part of right foot with fat layer e E11.42 Type 2 diabetes mellitus with diabetic polyneuropathy Modifier: xposed Quantity: Electronic Signature(s) Signed: 02/11/2020 4:20:59 PM By: Worthy Keeler PA-C Entered By: Worthy Keeler on 02/11/2020 16:20:58

## 2020-02-15 ENCOUNTER — Other Ambulatory Visit: Payer: Self-pay

## 2020-02-15 ENCOUNTER — Ambulatory Visit (HOSPITAL_COMMUNITY)
Admission: RE | Admit: 2020-02-15 | Discharge: 2020-02-15 | Disposition: A | Discharge status: Home / Self Care | Payer: Medicare Other | Source: Ambulatory Visit | Attending: Gastroenterology | Admitting: Gastroenterology

## 2020-02-15 DIAGNOSIS — D582 Other hemoglobinopathies: Secondary | ICD-10-CM | POA: Diagnosis present

## 2020-02-15 DIAGNOSIS — K760 Fatty (change of) liver, not elsewhere classified: Secondary | ICD-10-CM | POA: Diagnosis not present

## 2020-02-15 DIAGNOSIS — D132 Benign neoplasm of duodenum: Secondary | ICD-10-CM

## 2020-02-15 DIAGNOSIS — Z86010 Personal history of colonic polyps: Secondary | ICD-10-CM | POA: Diagnosis present

## 2020-02-17 NOTE — Progress Notes (Signed)
ZAIN, LANKFORD ([id number]) Visit Report for 02/11/2020 Arrival Information Details Patient Name: Robert Castro, Robert Castro Date of Service: 02/11/2020 3:30 PM Medical Record Number: [id number] Patient Account Number: [id number] Date of Birth/Sex: 1950/11/03 (68 y.o. M) Treating RN: Montey Hora Primary Care Mamie Hundertmark: Bing Matter Other Clinician: Referring Shylo Zamor: Bing Matter Treating Viola Kinnick/Extender: Melburn Hake, HOYT Weeks in Treatment: 5 Visit Information History Since Last Visit Added or deleted any medications: No Patient Arrived: Ambulatory Any new allergies or adverse reactions: No Arrival Time: 15:49 Had a fall or experienced change in No Accompanied By: self activities of daily living that may affect Transfer Assistance: None risk of falls: Patient Identification Verified: Yes Signs or symptoms of abuse/neglect since last visito No Secondary Verification Process Completed: Yes Hospitalized since last visit: No Implantable device outside of the clinic excluding No cellular tissue based products placed in the center since last visit: Has Dressing in Place as Prescribed: Yes Pain Present Now: No Electronic Signature(s) Signed: 02/11/2020 4:14:31 PM By: Montey Hora Entered By: Montey Hora on 02/11/2020 15:51:24 Stanke, Norva Riffle ([id number]) -------------------------------------------------------------------------------- Clinic Level of Care Assessment Details Patient Name: Robert Castro Date of Service: 02/11/2020 3:30 PM Medical Record Number: [id number] Patient Account Number: [id number] Date of Birth/Sex: April 25, 1951 (68 y.o. M) Treating RN: Army Melia Primary Care Tamikia Chowning: Bing Matter Other Clinician: Referring Raymir Frommelt: Bing Matter Treating Jamie Hafford/Extender: Melburn Hake, HOYT Weeks in Treatment: 5 Clinic Level of Care Assessment Items TOOL 4 Quantity Score []  - Use when only an EandM is performed on FOLLOW-UP visit 0 ASSESSMENTS - Nursing  Assessment / Reassessment X - Reassessment of Co-morbidities (includes updates in patient status) 1 10 X- 1 5 Reassessment of Adherence to Treatment Plan ASSESSMENTS - Wound and Skin Assessment / Reassessment X - Simple Wound Assessment / Reassessment - one wound 1 5 []  - 0 Complex Wound Assessment / Reassessment - multiple wounds []  - 0 Dermatologic / Skin Assessment (not related to wound area) ASSESSMENTS - Focused Assessment X - Circumferential Edema Measurements - multi extremities 1 5 []  - 0 Nutritional Assessment / Counseling / Intervention X- 1 5 Lower Extremity Assessment (monofilament, tuning fork, pulses) []  - 0 Peripheral Arterial Disease Assessment (using hand held doppler) ASSESSMENTS - Ostomy and/or Continence Assessment and Care []  - Incontinence Assessment and Management 0 []  - 0 Ostomy Care Assessment and Management (repouching, etc.) PROCESS - Coordination of Care X - Simple Patient / Family Education for ongoing care 1 15 []  - 0 Complex (extensive) Patient / Family Education for ongoing care X- 1 10 Staff obtains Programmer, systems, Records, Test Results / Process Orders []  - 0 Staff telephones HHA, Nursing Homes / Clarify orders / etc []  - 0 Routine Transfer to another Facility (non-emergent condition) []  - 0 Routine Hospital Admission (non-emergent condition) []  - 0 New Admissions / Biomedical engineer / Ordering NPWT, Apligraf, etc. []  - 0 Emergency Hospital Admission (emergent condition) X- 1 10 Simple Discharge Coordination []  - 0 Complex (extensive) Discharge Coordination PROCESS - Special Needs []  - Pediatric / Minor Patient Management 0 []  - 0 Isolation Patient Management []  - 0 Hearing / Language / Visual special needs []  - 0 Assessment of Community assistance (transportation, D/C planning, etc.) []  - 0 Additional assistance / Altered mentation []  - 0 Support Surface(s) Assessment (bed, cushion, seat, etc.) INTERVENTIONS - Wound Cleansing /  Measurement Eplin, Zackary S. ([id number]) X- 1 5 Simple Wound Cleansing - one wound []  - 0 Complex Wound Cleansing - multiple wounds X- 1 5 Wound Imaging (  photographs - any number of wounds) []  - 0 Wound Tracing (instead of photographs) X- 1 5 Simple Wound Measurement - one wound []  - 0 Complex Wound Measurement - multiple wounds INTERVENTIONS - Wound Dressings []  - Small Wound Dressing one or multiple wounds 0 []  - 0 Medium Wound Dressing one or multiple wounds []  - 0 Large Wound Dressing one or multiple wounds []  - 0 Application of Medications - topical []  - 0 Application of Medications - injection INTERVENTIONS - Miscellaneous []  - External ear exam 0 []  - 0 Specimen Collection (cultures, biopsies, blood, body fluids, etc.) []  - 0 Specimen(s) / Culture(s) sent or taken to Lab for analysis []  - 0 Patient Transfer (multiple staff / Civil Service fast streamer / Similar devices) []  - 0 Simple Staple / Suture removal (25 or less) []  - 0 Complex Staple / Suture removal (26 or more) []  - 0 Hypo / Hyperglycemic Management (close monitor of Blood Glucose) []  - 0 Ankle / Brachial Index (ABI) - do not check if billed separately X- 1 5 Vital Signs Has the patient been seen at the hospital within the last three years: Yes Total Score: 85 Level Of Care: New/Established - Level 3 Electronic Signature(s) Signed: 02/17/2020 10:25:16 AM By: Army Melia Entered By: Army Melia on 02/11/2020 16:01:42 Husk, Norva Riffle ([id number]) -------------------------------------------------------------------------------- Encounter Discharge Information Details Patient Name: Robert Castro Date of Service: 02/11/2020 3:30 PM Medical Record Number: [id number] Patient Account Number: [id number] Date of Birth/Sex: 1951-03-18 (68 y.o. M) Treating RN: Army Melia Primary Care Wenzel Backlund: Bing Matter Other Clinician: Referring Zyren Sevigny: Bing Matter Treating Rimsha Trembley/Extender: Melburn Hake, HOYT Weeks in  Treatment: 5 Encounter Discharge Information Items Discharge Condition: Stable Ambulatory Status: Ambulatory Discharge Destination: Home Transportation: Other Accompanied By: self Schedule Follow-up Appointment: Yes Clinical Summary of Care: Electronic Signature(s) Signed: 02/17/2020 10:25:16 AM By: Army Melia Entered By: Army Melia on 02/11/2020 16:02:56 Olafson, Norva Riffle ([id number]) -------------------------------------------------------------------------------- Lower Extremity Assessment Details Patient Name: Robert Castro Date of Service: 02/11/2020 3:30 PM Medical Record Number: [id number] Patient Account Number: [id number] Date of Birth/Sex: Apr 27, 1951 (68 y.o. M) Treating RN: Montey Hora Primary Care Marquinn Meschke: Bing Matter Other Clinician: Referring Royalty Fakhouri: Bing Matter Treating Surina Storts/Extender: STONE III, HOYT Weeks in Treatment: 5 Edema Assessment Assessed: [Left: No] [Right: No] Edema: [Left: Ye] [Right: s] Vascular Assessment Pulses: Dorsalis Pedis Palpable: [Right:Yes] Electronic Signature(s) Signed: 02/11/2020 4:14:31 PM By: Montey Hora Entered By: Montey Hora on 02/11/2020 15:55:55 Watanabe, Norva Riffle ([id number]) -------------------------------------------------------------------------------- Multi Wound Chart Details Patient Name: Robert Castro Date of Service: 02/11/2020 3:30 PM Medical Record Number: [id number] Patient Account Number: [id number] Date of Birth/Sex: 24-Mar-1951 (68 y.o. M) Treating RN: Army Melia Primary Care Talitha Dicarlo: Bing Matter Other Clinician: Referring Darrien Belter: Bing Matter Treating Clora Ohmer/Extender: Melburn Hake, HOYT Weeks in Treatment: 5 Vital Signs Height(in): 71 Pulse(bpm): 106 Weight(lbs): 245 Blood Pressure(mmHg): 153/87 Body Mass Index(BMI): 34 Temperature(F): 97.8 Respiratory Rate(breaths/min): 18 Photos: [N/A:N/A] Wound Location: Right, Plantar Toe Great N/A N/A Wounding Event:  Gradually Appeared N/A N/A Primary Etiology: Diabetic Wound/Ulcer of the Lower N/A N/A Extremity Comorbid History: Hypertension, Type II Diabetes, N/A N/A Osteoarthritis Date Acquired: 12/26/2019 N/A N/A Weeks of Treatment: 5 N/A N/A Wound Status: Open N/A N/A Measurements L x W x D (cm) 0.1x0.1x0.1 N/A N/A Area (cm) : 0.008 N/A N/A Volume (cm) : 0.001 N/A N/A % Reduction in Area: 98.00% N/A N/A % Reduction in Volume: 97.40% N/A N/A Classification: Grade 2 N/A N/A Exudate Amount: None Present N/A N/A Wound Margin: Flat and  Intact N/A N/A Granulation Amount: None Present (0%) N/A N/A Necrotic Amount: Large (67-100%) N/A N/A Exposed Structures: Fascia: No N/A N/A Fat Layer (Subcutaneous Tissue) Exposed: No Tendon: No Muscle: No Joint: No Bone: No Limited to Skin Breakdown Epithelialization: Large (67-100%) N/A N/A Treatment Notes Electronic Signature(s) Signed: 02/17/2020 10:25:16 AM By: Army Melia Entered By: Army Melia on 02/11/2020 15:58:39 Zahner, Norva Riffle ([id number]) -------------------------------------------------------------------------------- [place] Details Patient Name: Robert Castro Date of Service: 02/11/2020 3:30 PM Medical Record Number: [id number] Patient Account Number: [id number] Date of Birth/Sex: [birth year] (68 y.o. M) Treating RN: Army Melia Primary Care Kam Kushnir: Bing Matter Other Clinician: Referring Dashonda Bonneau: Bing Matter Treating Tyishia Aune/Extender: Melburn Hake, HOYT Weeks in Treatment: 5 Active Inactive Electronic Signature(s) Signed: 02/17/2020 10:25:16 AM By: Army Melia Entered By: Army Melia on 02/11/2020 16:00:44 Minasyan, Norva Riffle ([id number]) -------------------------------------------------------------------------------- Pain Assessment Details Patient Name: Robert Castro Date of Service: 02/11/2020 3:30 PM Medical Record Number: [id number] Patient Account Number: [id number] Date of Birth/Sex:  12-20-1950 (68 y.o. M) Treating RN: Montey Hora Primary Care Xochil Shanker: Bing Matter Other Clinician: Referring Kirsten Spearing: Bing Matter Treating Georgette Helmer/Extender: Melburn Hake, HOYT Weeks in Treatment: 5 Active Problems Location of Pain Severity and Description of Pain Patient Has Paino No Site Locations Pain Management and Medication Current Pain Management: Electronic Signature(s) Signed: 02/11/2020 4:14:31 PM By: Montey Hora Entered By: Montey Hora on 02/11/2020 15:51:34 Snider, Norva Riffle ([id number]) -------------------------------------------------------------------------------- Patient/Caregiver Education Details Patient Name: Robert Castro Date of Service: 02/11/2020 3:30 PM Medical Record Number: [id number] Patient Account Number: [id number] Date of Birth/Gender: 03/27/1951 (68 y.o. M) Treating RN: Army Melia Primary Care Physician: Bing Matter Other Clinician: Referring Physician: Bing Matter Treating Physician/Extender: Sharalyn Ink in Treatment: 5 Education Assessment Education Provided To: Patient Education Topics Provided Wound/Skin Impairment: Handouts: Caring for Your Ulcer Methods: Demonstration Responses: State content correctly Electronic Signature(s) Signed: 02/17/2020 10:25:16 AM By: Army Melia Entered By: Army Melia on 02/11/2020 16:02:12 Robert Castro ([id number]) -------------------------------------------------------------------------------- Wound Assessment Details Patient Name: Robert Castro Date of Service: 02/11/2020 3:30 PM Medical Record Number: [id number] Patient Account Number: [id number] Date of Birth/Sex: 02/14/1951 (68 y.o. M) Treating RN: Army Melia Primary Care Ryleeann Urquiza: Bing Matter Other Clinician: Referring Lura Falor: Bing Matter Treating Juelle Dickmann/Extender: Melburn Hake, HOYT Weeks in Treatment: 5 Wound Status Wound Number: 1 Primary Etiology: Diabetic Wound/Ulcer of the Lower  Extremity Wound Location: Right, Plantar Toe Great Wound Status: Healed - Epithelialized Wounding Event: Gradually Appeared Comorbid History: Hypertension, Type II Diabetes, Osteoarthritis Date Acquired: 12/26/2019 Weeks Of Treatment: 5 Clustered Wound: No Photos Wound Measurements Length: (cm) 0 Width: (cm) 0 Depth: (cm) 0 Area: (cm) 0 Volume: (cm) 0 % Reduction in Area: 100% % Reduction in Volume: 100% Epithelialization: Large (67-100%) Tunneling: No Undermining: No Wound Description Classification: Grade 2 Wound Margin: Flat and Intact Exudate Amount: None Present Foul Odor After Cleansing: No Slough/Fibrino No Wound Bed Granulation Amount: None Present (0%) Exposed Structure Necrotic Amount: Large (67-100%) Fascia Exposed: No Necrotic Quality: Adherent Slough Fat Layer (Subcutaneous Tissue) Exposed: No Tendon Exposed: No Muscle Exposed: No Joint Exposed: No Bone Exposed: No Limited to Skin Breakdown Electronic Signature(s) Signed: 02/17/2020 10:25:16 AM By: Army Melia Entered By: Army Melia on 02/11/2020 16:00:30 Robert Castro ([id number]) -------------------------------------------------------------------------------- Vitals Details Patient Name: Robert Castro Date of Service: 02/11/2020 3:30 PM Medical Record Number: [id number] Patient Account Number: [id number] Date of Birth/Sex: Dec 20, 1950 (68 y.o. M) Treating RN: Montey Hora Primary Care Elius Etheredge: Bing Matter Other Clinician: Referring Quantel Mcinturff: Bing Matter  Treating Daxten Kovalenko/Extender: STONE III, HOYT Weeks in Treatment: 5 Vital Signs Time Taken: 15:53 Temperature (F): 97.8 Height (in): 71 Pulse (bpm): 106 Weight (lbs): 245 Respiratory Rate (breaths/min): 18 Body Mass Index (BMI): 34.2 Blood Pressure (mmHg): 153/87 Reference Range: 80 - 120 mg / dl Electronic Signature(s) Signed: 02/11/2020 4:14:31 PM By: Montey Hora Entered By: Montey Hora on 02/11/2020 15:53:45

## 2020-02-22 ENCOUNTER — Inpatient Hospital Stay: Payer: Medicare Other

## 2020-02-22 ENCOUNTER — Other Ambulatory Visit: Payer: Self-pay

## 2020-02-22 ENCOUNTER — Inpatient Hospital Stay: Payer: Medicare Other | Attending: Hematology | Admitting: Hematology

## 2020-02-22 VITALS — BP 154/95 | HR 94 | Temp 98.1°F | Resp 18 | Ht 70.0 in | Wt 256.8 lb

## 2020-02-22 DIAGNOSIS — I219 Acute myocardial infarction, unspecified: Secondary | ICD-10-CM | POA: Insufficient documentation

## 2020-02-22 DIAGNOSIS — G4730 Sleep apnea, unspecified: Secondary | ICD-10-CM | POA: Diagnosis not present

## 2020-02-22 DIAGNOSIS — F329 Major depressive disorder, single episode, unspecified: Secondary | ICD-10-CM | POA: Diagnosis not present

## 2020-02-22 DIAGNOSIS — N400 Enlarged prostate without lower urinary tract symptoms: Secondary | ICD-10-CM | POA: Insufficient documentation

## 2020-02-22 DIAGNOSIS — E785 Hyperlipidemia, unspecified: Secondary | ICD-10-CM | POA: Insufficient documentation

## 2020-02-22 DIAGNOSIS — D751 Secondary polycythemia: Secondary | ICD-10-CM

## 2020-02-22 DIAGNOSIS — E291 Testicular hypofunction: Secondary | ICD-10-CM | POA: Insufficient documentation

## 2020-02-22 DIAGNOSIS — M1990 Unspecified osteoarthritis, unspecified site: Secondary | ICD-10-CM | POA: Diagnosis not present

## 2020-02-22 DIAGNOSIS — K219 Gastro-esophageal reflux disease without esophagitis: Secondary | ICD-10-CM | POA: Diagnosis not present

## 2020-02-22 DIAGNOSIS — Z87891 Personal history of nicotine dependence: Secondary | ICD-10-CM | POA: Diagnosis not present

## 2020-02-22 DIAGNOSIS — E1136 Type 2 diabetes mellitus with diabetic cataract: Secondary | ICD-10-CM | POA: Insufficient documentation

## 2020-02-22 DIAGNOSIS — Z7289 Other problems related to lifestyle: Secondary | ICD-10-CM | POA: Diagnosis not present

## 2020-02-22 DIAGNOSIS — I10 Essential (primary) hypertension: Secondary | ICD-10-CM | POA: Diagnosis not present

## 2020-02-22 DIAGNOSIS — Z7984 Long term (current) use of oral hypoglycemic drugs: Secondary | ICD-10-CM | POA: Diagnosis not present

## 2020-02-22 DIAGNOSIS — K760 Fatty (change of) liver, not elsewhere classified: Secondary | ICD-10-CM | POA: Diagnosis not present

## 2020-02-22 DIAGNOSIS — E1140 Type 2 diabetes mellitus with diabetic neuropathy, unspecified: Secondary | ICD-10-CM | POA: Diagnosis not present

## 2020-02-22 DIAGNOSIS — Z79899 Other long term (current) drug therapy: Secondary | ICD-10-CM | POA: Insufficient documentation

## 2020-02-22 DIAGNOSIS — E039 Hypothyroidism, unspecified: Secondary | ICD-10-CM | POA: Diagnosis not present

## 2020-02-22 DIAGNOSIS — Z7982 Long term (current) use of aspirin: Secondary | ICD-10-CM | POA: Diagnosis not present

## 2020-02-22 DIAGNOSIS — Z791 Long term (current) use of non-steroidal anti-inflammatories (NSAID): Secondary | ICD-10-CM | POA: Diagnosis not present

## 2020-02-22 LAB — CBC WITH DIFFERENTIAL/PLATELET
Abs Immature Granulocytes: 0.02 10*3/uL (ref 0.00–0.07)
Basophils Absolute: 0.1 10*3/uL (ref 0.0–0.1)
Basophils Relative: 1 %
Eosinophils Absolute: 0.1 10*3/uL (ref 0.0–0.5)
Eosinophils Relative: 2 %
HCT: 54.6 % — ABNORMAL HIGH (ref 39.0–52.0)
Hemoglobin: 18.2 g/dL — ABNORMAL HIGH (ref 13.0–17.0)
Immature Granulocytes: 0 %
Lymphocytes Relative: 33 %
Lymphs Abs: 1.6 10*3/uL (ref 0.7–4.0)
MCH: 33.2 pg (ref 26.0–34.0)
MCHC: 33.3 g/dL (ref 30.0–36.0)
MCV: 99.6 fL (ref 80.0–100.0)
Monocytes Absolute: 0.5 10*3/uL (ref 0.1–1.0)
Monocytes Relative: 10 %
Neutro Abs: 2.6 10*3/uL (ref 1.7–7.7)
Neutrophils Relative %: 54 %
Platelets: 143 10*3/uL — ABNORMAL LOW (ref 150–400)
RBC: 5.48 MIL/uL (ref 4.22–5.81)
RDW: 13.2 % (ref 11.5–15.5)
WBC: 4.8 10*3/uL (ref 4.0–10.5)
nRBC: 0 % (ref 0.0–0.2)

## 2020-02-22 LAB — CMP (CANCER CENTER ONLY)
ALT: 112 U/L — ABNORMAL HIGH (ref 0–44)
AST: 73 U/L — ABNORMAL HIGH (ref 15–41)
Albumin: 4.6 g/dL (ref 3.5–5.0)
Alkaline Phosphatase: 95 U/L (ref 38–126)
Anion gap: 14 (ref 5–15)
BUN: 19 mg/dL (ref 8–23)
CO2: 26 mmol/L (ref 22–32)
Calcium: 9.9 mg/dL (ref 8.9–10.3)
Chloride: 103 mmol/L (ref 98–111)
Creatinine: 1.25 mg/dL — ABNORMAL HIGH (ref 0.61–1.24)
GFR, Est AFR Am: >60 mL/min (ref >60)
GFR, Estimated: 58 mL/min — ABNORMAL LOW (ref >60)
Glucose, Bld: 163 mg/dL — ABNORMAL HIGH (ref 70–99)
Potassium: 4.4 mmol/L (ref 3.5–5.1)
Sodium: 143 mmol/L (ref 135–145)
Total Bilirubin: 0.8 mg/dL (ref 0.3–1.2)
Total Protein: 7.2 g/dL (ref 6.5–8.1)

## 2020-02-22 NOTE — Progress Notes (Signed)
HEMATOLOGY/ONCOLOGY CONSULTATION NOTE  Date of Service: 02/22/2020  Patient Care Team: Amie Critchley as PCP - General (Family Medicine)  CHIEF COMPLAINTS/PURPOSE OF CONSULTATION:  Polycythemia   HISTORY OF PRESENTING ILLNESS:  Robert Castro is a wonderful 69 y.o. male who has been referred to [place] by Dr. Havery Moros for evaluation and management of polycythemia. The pt reports that he is doing well overall.   The pt reports that he has Diabetes and Neuropathy, which causes him to feel fuzzy and sluggish. Overall he has not felt significantly different over the last few months and his weight has been steady. He has sleep apnea and uses his CPAP machine regularly. His last sleep study was conducted nearly 15 years ago. Pt is receiving testosterone injections with Dr. Jeffie Pollock at [facility]. He has been receiving these injections for over 10 years and there has been no recent change in the dosage. Pt states that his Testosterone levels are stable based his last labs. He denies any previous blood clots, heart attacks, or strokes.   Most recent lab results (01/27/2020) of CBC w/diff is as follows: all values are WNL except for Hgb at 18.4, HCT at 54.0, PLT at 134K, Mono Rel at 13.6.  On review of systems, pt reports sluggishness and denies unexpected weight loss, new bone pain, fever, chills and any other symptoms.   On PMHx the pt reports Sleep Apnea, Type II Diabetes, Neuropathy, Back surgery, Testosterone deficiency, Fatty liver. On Social Hx the pt reports that he his a non-smoker and drinks 3-4 alcoholic beverages per day (mixed drinks).   MEDICAL HISTORY:  Past Medical History:  Diagnosis Date  . Adenomatous duodenal polyp   . Arthritis   . Barrett's esophagus   . Benign enlargement of prostate   . Cataract    both eyes  . 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 Depression   . Diabetes ([place])   . Diverticulosis   . Fatty  liver   . GERD (gastroesophageal reflux disease)   . HOH (hard of hearing)   . Hyperlipidemia   . Hypertension   . Hypothyroidism   . Internal hemorrhoids   . Leg edema, left   . Macular degeneration   . Neuromuscular disorder ([place])   . Osteoarthritis   . Sleep apnea    cpap  . Testosterone deficiency   . Tremors of nervous system   . Umbilical hernia     SURGICAL HISTORY: Past Surgical History:  Procedure Laterality Date  . BACK SURGERY  2009  . BACK SURGERY  09/2014   Dr Marykay Lex  . CATARACT EXTRACTION, BILATERAL  12/2016  . ESOPHAGOGASTRODUODENOSCOPY (EGD) WITH PROPOFOL N/A 09/16/2017   Procedure: ESOPHAGOGASTRODUODENOSCOPY (EGD) WITH PROPOFOL;  Surgeon: Yetta Flock, MD;  Location: [facility];  Service: Gastroenterology;  Laterality: N/A;  . LUMBAR LAMINECTOMY/ DECOMPRESSION WITH MET-RX  2014  . TONSILLECTOMY  1956  . UMBILICAL HERNIA REPAIR      SOCIAL HISTORY: Social History   Socioeconomic History  . Marital status: Single    Spouse name: Not on file  . Number of children: Not on file  . Years of education: Not on file  . Highest education level: Not on file  Occupational History  . Not on file  Tobacco Use  . Smoking status: Former Smoker    Quit date: 08/25/2002    Years since quitting: 17.5  . Smokeless tobacco: Never Used  . Tobacco  comment: in college only 40 years ago  Vaping Use  . Vaping Use: Never used  Substance and Sexual Activity  . Alcohol use: Yes    Alcohol/week: 14.0 - 28.0 standard drinks    Types: 14 - 28 Standard drinks or equivalent per week    Comment: varies  daily drinker  . Drug use: No  . Sexual activity: Yes  Other Topics Concern  . Not on file  Social History Narrative  . Not on file   Social Determinants of Health   Financial Resource Strain:   . Difficulty of Paying Living Expenses:   Food Insecurity:   . Worried About Charity fundraiser in the Last Year:   . Arboriculturist in the Last Year:     Transportation Needs:   . Film/video editor (Medical):   [name] Lack of Transportation (Non-Medical):   Physical Activity:   . Days of Exercise per Week:   . Minutes of Exercise per Session:   Stress:   . Feeling of Stress :   Social Connections:   . Frequency of Communication with Friends and Family:   . Frequency of Social Gatherings with Friends and Family:   . Attends Religious Services:   . Active Member of Clubs or Organizations:   . Attends Archivist Meetings:   [name] Marital Status:   Intimate Partner Violence:   . Fear of Current or Ex-Partner:   . Emotionally Abused:   [name] Physically Abused:   . Sexually Abused:     FAMILY HISTORY: Family History  Problem Relation Age of Onset  . Atrial fibrillation Brother   . Cancer Mother 17       abdominal cancer, unk name of cancer type  . Heart attack Maternal Grandfather   . Heart attack Paternal Grandfather   . Colon cancer Neg Hx   . Stomach cancer Neg Hx   . Colon polyps Neg Hx   . Esophageal cancer Neg Hx   . Rectal cancer Neg Hx     ALLERGIES:  has No Known Allergies.  MEDICATIONS:  Current Outpatient Medications  Medication Sig Dispense Refill  . ALPHA LIPOIC ACID PO Take 400 mg by mouth daily.    [name] amLODipine (NORVASC) 10 MG tablet Take 10 mg Daily by mouth.     [name] anastrozole (ARIMIDEX) 1 MG tablet Take 1 mg daily by mouth.    [name] ascorbic acid (VITAMIN C) 500 MG tablet Take 1,000 mg by mouth daily.    [name] aspirin EC 81 MG tablet Take 81 mg by mouth daily.    [name] atorvastatin (LIPITOR) 10 MG tablet Take 10 mg by mouth daily.     [name] buPROPion (WELLBUTRIN XL) 300 MG 24 hr tablet Take 300 mg by mouth Daily.     . calcium-vitamin D (OSCAL WITH D) 250-125 MG-UNIT tablet Take 1 tablet by mouth daily.    . chlorhexidine (PERIDEX) 0.12 % solution Use as directed 10 mLs in the mouth or throat 2 (two) times daily as needed.   0  . Cholecalciferol (VITAMIN D-1000 MAX ST) 1000 units tablet Take 1,000 Units by mouth  daily.     . diazepam (VALIUM) 5 MG tablet Take 5-10 mg by mouth every 6 (six) hours as needed (for spasms).     . diclofenac (VOLTAREN) 75 MG EC tablet Take 75 mg by mouth 2 (two) times daily as needed (for pain.).     [name] Dulaglutide (TRULICITY) 1.5 IR/4.4RX SOPN Inject  into the skin.    . fluocinonide ointment (LIDEX) 7.09 % Apply 1 application topically 2 (two) times daily as needed (IRRITATION).    . fluticasone (FLONASE) 50 MCG/ACT nasal spray Place 1 spray into both nostrils daily as needed for allergies or rhinitis.    [name] JARDIANCE 10 MG TABS tablet Take 10 mg by mouth daily.    . Lancets (ONETOUCH ULTRASOFT) lancets 1 each by Misc.(Non-Drug; Combo Route) route daily. Dx E11.9    . metFORMIN (GLUCOPHAGE-XR) 500 MG 24 hr tablet Take 1,000 mg by mouth 2 (two) times daily.    [name] MILK THISTLE PO Take 1 capsule by mouth daily.    . Multiple Vitamins-Minerals (PRESERVISION AREDS 2 PO) Take 1 capsule by mouth 2 (two) times daily.     . Multiple Vitamins-Minerals (PRESERVISION AREDS 2 PO) Take by mouth.    . olmesartan (BENICAR) 20 MG tablet Take 20 mg by mouth daily.    . Omega-3 Fatty Acids (FISH OIL) 1200 MG CAPS Take 1 capsule by mouth daily.     [name] omeprazole (PRILOSEC) 20 MG capsule Take 20 mg by mouth daily.    . sertraline (ZOLOFT) 100 MG tablet Take 200 mg by mouth daily.     [name] testosterone cypionate (DEPOTESTOTERONE CYPIONATE) 200 MG/ML injection Inject 200 mg into the muscle every 14 (fourteen) days.   1  . Thiamine HCl (B-1 PO) Take 300 mg by mouth daily.    [name] thyroid (ARMOUR) 60 MG tablet Take 60 mg by mouth daily.    . traMADol (ULTRAM) 50 MG tablet Take 50 mg by mouth every 6 (six) hours as needed (for pain.).     [name] TURMERIC PO Take 1,000 mg by mouth daily.     [name] zolpidem (AMBIEN) 10 MG tablet Take 10 mg by mouth daily as needed. For sleep     No current facility-administered medications for this visit.    REVIEW OF SYSTEMS:    10 Point review of Systems was done is negative  except as noted above.  PHYSICAL EXAMINATION: ECOG PERFORMANCE STATUS: 1 - Symptomatic but completely ambulatory  . Vitals:   02/22/20 1401  BP: (!) 154/95  Pulse: 94  Resp: 18  Temp: 98.1 F (36.7 C)  SpO2: 93%   Filed Weights   02/22/20 1401  Weight: 256 lb 12.8 oz (116.5 kg)   .Body mass index is 36.85 kg/m.  GENERAL:alert, in no acute distress and comfortable SKIN: no acute rashes, no significant lesions EYES: conjunctiva are pink and non-injected, sclera anicteric OROPHARYNX: MMM, no exudates, no oropharyngeal erythema or ulceration NECK: supple, no JVD LYMPH:  no palpable lymphadenopathy in the cervical, axillary or inguinal regions LUNGS: clear to auscultation b/l with normal respiratory effort HEART: regular rate & rhythm ABDOMEN:  normoactive bowel sounds , non tender, not distended. Extremity: no pedal edema PSYCH: alert & oriented x 3 with fluent speech NEURO: no focal motor/sensory deficits  LABORATORY DATA:  I have reviewed the data as listed  . CBC Latest Ref Rng & Units 02/22/2020 01/27/2020 12/18/2019  WBC 4.0 - 10.5 K/uL 4.8 4.9 4.5  Hemoglobin 13.0 - 17.0 g/dL 18.2(H) 18.4 Repeated and verified X2.([place]) 17.2(H)  Hematocrit 39 - 52 % 54.6(H) 54.0(H) 51.6  Platelets 150 - 400 K/uL 143(L) 134.0(L) 132.0(L)    . CMP Latest Ref Rng & Units 02/22/2020 12/18/2019 12/18/2019  Glucose 70 - 99 mg/dL 163(H) - 204(H)  BUN 8 - 23 mg/dL 19 - 23  Creatinine 0.61 - 1.24  mg/dL 1.25(H) - 1.20  Sodium 135 - 145 mmol/L 143 - 140  Potassium 3.5 - 5.1 mmol/L 4.4 - 4.1  Chloride 98 - 111 mmol/L 103 - 101  CO2 22 - 32 mmol/L 26 - 30  Calcium 8.9 - 10.3 mg/dL 9.9 - 9.6  Total Protein 6.5 - 8.1 g/dL 7.2 6.7 6.7  Total Bilirubin 0.3 - 1.2 mg/dL 0.8 1.0 1.0  Alkaline Phos 38 - 126 U/L 95 95 95  AST 15 - 41 U/L 73(H) 37 37  ALT 0 - 44 U/L 112(H) 59(H) 59(H)     RADIOGRAPHIC STUDIES: I have personally reviewed the radiological images as listed and agreed with the findings  in the report. US Abdomen Limited RUQ  Result Date: 02/15/2020 CLINICAL DATA:  Evaluate for cirrhosis or fatty liver. EXAM: ULTRASOUND ABDOMEN LIMITED RIGHT UPPER QUADRANT COMPARISON:  None. FINDINGS: Gallbladder: Multiple subcentimeter shadowing echogenic gallstones are seen within the gallbladder lumen. There is no evidence of gallbladder wall thickening (1.6 mm). No sonographic Murphy sign noted by sonographer. Common bile duct: Diameter: 4.3 mm Liver: No focal lesion identified. Diffusely increased echogenicity of the liver parenchyma is noted. Portal vein is patent on color Doppler imaging with normal direction of blood flow towards the liver. Other: None. IMPRESSION: 1. Cholelithiasis, without evidence of acute cholecystitis. 2. Fatty liver. Electronically Signed   By: Virgina Norfolk M.D.   On: 02/15/2020 22:50    ASSESSMENT & PLAN:   69 yo with   1) POlycythemia  ?secondary- to sleep apnea, testosterone replacement, dehydration.  R/o Polycythemia vera PLAN: -Discussed patient's most recent labs from 01/27/2020, all values are WNL except for Hgb at 18.4, HCT at 54.0, PLT at 134K, Mono Rel at 13.6. -Advised pt that polycythemia can increase the of risk a blood clot, heart attack, or stroke  -Advised pt that polycythemia can be caused by a primary bone marrow disorder or it can be secondary to stressors.  -Advised pt that testosterone replacement, heavy alcohol use, and sleep apnea could cause secondary polycythemia.  -Recommend pt f/u with Bing Matter, PA-C to discuss need for repeating sleep study  -Advised pt that excess alcohol use can cause dehydration. Recommend pt cut down alcohol use.  -Recommend pt f/u with Dr. Roni Bread for Testosterone replacement management  -Advised pt that if he had PCV we would complete therapeutic phlebotomies to keep HCT<45 -Will get labs today, including JAK2 mutation studies -Will see back in 2 weeks via phone    FOLLOW UP: Labs today Phone visit  with Dr Irene Limbo in 2 weeks  . Orders Placed This Encounter  Procedures  . CBC with Differential/Platelet    Standing Status:   Future    Number of Occurrences:   1    Standing Expiration Date:   02/21/2021  . CMP ([place] only)    Standing Status:   Future    Number of Occurrences:   1    Standing Expiration Date:   02/21/2021  . Erythropoietin    Standing Status:   Future    Number of Occurrences:   1    Standing Expiration Date:   02/21/2021  . JAK2 (including V617F and Exon 12), MPL, and CALR-Next Generation Sequencing    Standing Status:   Future    Number of Occurrences:   1    Standing Expiration Date:   02/21/2021    All of the patients questions were answered with apparent satisfaction. The patient knows to call the clinic with any  problems, questions or concerns.  I spent 30 mins counseling the patient face to face. The total time spent in the appointment was 45 minutes and more than 50% was on counseling and direct patient cares.    Sullivan Lone MD [place] AAHIVMS [facility] Hematology/Oncology Physician [facility]  (Office):       [phone] (Work cell):  [phone] (Fax):           [phone]  02/22/2020 4:48 PM  I, Yevette Edwards, am acting as a scribe for Dr. Sullivan Lone.   .I have reviewed the above documentation for accuracy and completeness, and I agree with the above. Brunetta Genera MD

## 2020-02-23 LAB — ERYTHROPOIETIN: Erythropoietin: 17 m[IU]/mL (ref 2.6–18.5)

## 2020-02-23 NOTE — Progress Notes (Signed)
ODYN, TURKO ([id number]) Visit Report for 01/06/2020 Allergy List Details Patient Name: Robert Castro, Robert Castro Date of Service: 01/06/2020 2:15 PM Medical Record Number: [id number] Patient Account Number: [id number] Date of Birth/Sex: [birth year] (68 y.o. M) Treating RN: Army Melia Primary Care Jonatha Gagen: Bing Matter Other Clinician: Referring Jayan Raymundo: Bing Matter Treating Cathyrn Deas/Extender: Ricard Dillon Weeks in Treatment: 0 Allergies Active Allergies No Known Allergies Allergy Notes Electronic Signature(s) Signed: 01/07/2020 11:19:24 AM By: Army Melia Entered By: Army Melia on 01/06/2020 14:38:46 Reith, Robert Castro ([id number]) -------------------------------------------------------------------------------- Arrival Information Details Patient Name: Robert Castro Date of Service: 01/06/2020 2:15 PM Medical Record Number: [id number] Patient Account Number: [id number] Date of Birth/Sex: 1951-04-14 (68 y.o. M) Treating RN: Cornell Barman Primary Care Jazsmine Macari: Bing Matter Other Clinician: Referring Destinee Taber: Bing Matter Treating Daltyn Degroat/Extender: Tito Dine in Treatment: 0 Visit Information Patient Arrived: Ambulatory Arrival Time: 14:27 Accompanied By: self Transfer Assistance: None Patient Identification Verified: Yes Secondary Verification Process Completed: Yes Electronic Signature(s) Signed: 01/06/2020 4:54:55 PM By: Lorine Bears RCP, RRT, CHT Entered By: Becky Sax, Amado Nash on 01/06/2020 14:27:52 Schmale, Robert Castro ([id number]) -------------------------------------------------------------------------------- Clinic Level of Care Assessment Details Patient Name: Robert Castro Date of Service: 01/06/2020 2:15 PM Medical Record Number: [id number] Patient Account Number: [id number] Date of Birth/Sex: 05/26/1951 (68 y.o. M) Treating RN: Cornell Barman Primary Care Mariselda Badalamenti: Bing Matter Other Clinician: Referring  Lavayah Vita: Bing Matter Treating Kasee Hantz/Extender: Tito Dine in Treatment: 0 Clinic Level of Care Assessment Items TOOL 2 Quantity Score []  - Use when only an EandM is performed on the INITIAL visit 0 ASSESSMENTS - Nursing Assessment / Reassessment X - General Physical Exam (combine w/ comprehensive assessment (listed just below) when performed on new 1 20 pt. evals) X- 1 25 Comprehensive Assessment (HX, ROS, Risk Assessments, Wounds Hx, etc.) ASSESSMENTS - Wound and Skin Assessment / Reassessment X - Simple Wound Assessment / Reassessment - one wound 1 5 []  - 0 Complex Wound Assessment / Reassessment - multiple wounds []  - 0 Dermatologic / Skin Assessment (not related to wound area) ASSESSMENTS - Ostomy and/or Continence Assessment and Care []  - Incontinence Assessment and Management 0 []  - 0 Ostomy Care Assessment and Management (repouching, etc.) PROCESS - Coordination of Care X - Simple Patient / Family Education for ongoing care 1 15 []  - 0 Complex (extensive) Patient / Family Education for ongoing care X- 1 10 Staff obtains Programmer, systems, Records, Test Results / Process Orders []  - 0 Staff telephones HHA, Nursing Homes / Clarify orders / etc []  - 0 Routine Transfer to another Facility (non-emergent condition) []  - 0 Routine Hospital Admission (non-emergent condition) []  - 0 New Admissions / Biomedical engineer / Ordering NPWT, Apligraf, etc. []  - 0 Emergency Hospital Admission (emergent condition) X- 1 10 Simple Discharge Coordination []  - 0 Complex (extensive) Discharge Coordination PROCESS - Special Needs []  - Pediatric / Minor Patient Management 0 []  - 0 Isolation Patient Management []  - 0 Hearing / Language / Visual special needs []  - 0 Assessment of Community assistance (transportation, D/C planning, etc.) []  - 0 Additional assistance / Altered mentation []  - 0 Support Surface(s) Assessment (bed, cushion, seat, etc.) INTERVENTIONS -  Wound Cleansing / Measurement X - Wound Imaging (photographs - any number of wounds) 1 5 []  - 0 Wound Tracing (instead of photographs) X- 1 5 Simple Wound Measurement - one wound []  - 0 Complex Wound Measurement - multiple wounds Castro, Robert S. ([id number]) X- 1 5 Simple Wound Cleansing - one wound []  -  0 Complex Wound Cleansing - multiple wounds INTERVENTIONS - Wound Dressings []  - Small Wound Dressing one or multiple wounds 0 X- 1 15 Medium Wound Dressing one or multiple wounds []  - 0 Large Wound Dressing one or multiple wounds []  - 0 Application of Medications - injection INTERVENTIONS - Miscellaneous []  - External ear exam 0 []  - 0 Specimen Collection (cultures, biopsies, blood, body fluids, etc.) []  - 0 Specimen(s) / Culture(s) sent or taken to Lab for analysis []  - 0 Patient Transfer (multiple staff / Harrel Lemon Lift / Similar devices) []  - 0 Simple Staple / Suture removal (25 or less) []  - 0 Complex Staple / Suture removal (26 or more) []  - 0 Hypo / Hyperglycemic Management (close monitor of Blood Glucose) X- 1 15 Ankle / Brachial Index (ABI) - do not check if billed separately Has the patient been seen at the hospital within the last three years: Yes Total Score: 130 Level Of Care: New/Established - Level 4 Electronic Signature(s) Signed: 02/23/2020 12:53:52 PM By: Gretta Cool, BSN, RN, CWS, Kim RN, BSN Entered By: Gretta Cool, BSN, RN, CWS, Kim on 01/06/2020 15:04:45 Robert Castro ([id number]) -------------------------------------------------------------------------------- Encounter Discharge Information Details Patient Name: Robert Castro Date of Service: 01/06/2020 2:15 PM Medical Record Number: [id number] Patient Account Number: [id number] Date of Birth/Sex: 1951-03-12 (68 y.o. M) Treating RN: Cornell Barman Primary Care Alora Gorey: Bing Matter Other Clinician: Referring Tra Wilemon: Bing Matter Treating Mahkai Fangman/Extender: Tito Dine in Treatment:  0 Encounter Discharge Information Items Discharge Condition: Stable Ambulatory Status: Ambulatory Discharge Destination: Home Transportation: Private Auto Accompanied By: self Schedule Follow-up Appointment: Yes Clinical Summary of Care: Electronic Signature(s) Signed: 02/23/2020 12:53:52 PM By: Gretta Cool, BSN, RN, CWS, Kim RN, BSN Entered By: Gretta Cool, BSN, RN, CWS, Kim on 01/06/2020 15:06:44 Robert Castro ([id number]) -------------------------------------------------------------------------------- Lower Extremity Assessment Details Patient Name: Robert Castro Date of Service: 01/06/2020 2:15 PM Medical Record Number: [id number] Patient Account Number: [id number] Date of Birth/Sex: 10-21-1950 (68 y.o. M) Treating RN: Army Melia Primary Care Alexes Menchaca: Bing Matter Other Clinician: Referring Latria Mccarron: Bing Matter Treating Damont Balles/Extender: Tito Dine in Treatment: 0 Edema Assessment Assessed: [Left: No] [Right: No] Edema: [Left: No] [Right: No] Calf Left: Right: Point of Measurement: 37 cm From Medial Instep 40 cm 42 cm Ankle Left: Right: Point of Measurement: 12 cm From Medial Instep 23.5 cm 23 cm Vascular Assessment Pulses: Dorsalis Pedis Palpable: [Left:Yes] [Right:Yes] Doppler Audible: [Left:Yes] Posterior Tibial Palpable: [Left:Yes] Doppler Audible: [Left:Yes] Popliteal Palpable: [Left:Yes] Blood Pressure: Brachial: [Left:165] Dorsalis Pedis: 180 Ankle: Posterior Tibial: 192 Ankle Brachial Index: [Left:1.16] Electronic Signature(s) Signed: 01/07/2020 11:19:24 AM By: Army Melia Entered By: Army Melia on 01/06/2020 14:50:03 Steagall, Robert Castro ([id number]) -------------------------------------------------------------------------------- Multi Wound Chart Details Patient Name: Robert Castro Date of Service: 01/06/2020 2:15 PM Medical Record Number: [id number] Patient Account Number: [id number] Date of Birth/Sex: 1951/04/22 (68 y.o.  M) Treating RN: Cornell Barman Primary Care Damia Bobrowski: Bing Matter Other Clinician: Referring Valente Fosberg: Bing Matter Treating Maguire Sime/Extender: Tito Dine in Treatment: 0 Vital Signs Height(in): 71 Pulse(bpm): 95 Weight(lbs): 245 Blood Pressure(mmHg): 165/88 Body Mass Index(BMI): 34 Temperature(F): 98.7 Respiratory Rate(breaths/min): 18 Photos: [N/A:N/A] Wound Location: Right, Plantar Toe Great N/A N/A Wounding Event: Gradually Appeared N/A N/A Primary Etiology: Diabetic Wound/Ulcer of the Lower N/A N/A Extremity Comorbid History: Hypertension, Type II Diabetes, N/A N/A Osteoarthritis Date Acquired: 12/26/2019 N/A N/A Weeks of Treatment: 0 N/A N/A Wound Status: Open N/A N/A Measurements L x W x D (cm) 1x0.5x0.1 N/A N/A Area (cm) :  0.393 N/A N/A Volume (cm) : 0.039 N/A N/A Classification: Grade 2 N/A N/A Exudate Amount: Medium N/A N/A Exudate Type: Serosanguineous N/A N/A Exudate Color: red, brown N/A N/A Wound Margin: Flat and Intact N/A N/A Granulation Amount: Large (67-100%) N/A N/A Granulation Quality: Red N/A N/A Necrotic Amount: Small (1-33%) N/A N/A Exposed Structures: Fat Layer (Subcutaneous Tissue) N/A N/A Exposed: Yes Fascia: No Tendon: No Muscle: No Joint: No Bone: No Epithelialization: None N/A N/A Treatment Notes Wound #1 (Right, Plantar Toe Great) 1. Cleansed with: Cleanse wound with antibacterial soap and water 2. Anesthetic Topical Lidocaine 4% cream to wound bed prior to debridement 4. Dressing Applied: Prisma Ag Robert Castro ([id number]) 5. Secondary Dressing Applied Gauze and Kerlix/Conform 6. Footwear/Offloading device applied Surgical shoe Notes offloading felt Electronic Signature(s) Signed: 01/07/2020 7:53:18 AM By: Linton Ham MD Entered By: Linton Ham on 01/06/2020 15:30:39 Tokarz, Robert Castro ([id number]) -------------------------------------------------------------------------------- Park Details Patient Name: Robert Castro Date of Service: 01/06/2020 2:15 PM Medical Record Number: [id number] Patient Account Number: [id number] Date of Birth/Sex: [birth year] (68 y.o. M) Treating RN: Cornell Barman Primary Care Reiss Mowrey: Bing Matter Other Clinician: Referring Kenli Waldo: Bing Matter Treating Quorra Rosene/Extender: Tito Dine in Treatment: 0 Active Inactive Necrotic Tissue Nursing Diagnoses: Impaired tissue integrity related to necrotic/devitalized tissue Goals: Necrotic/devitalized tissue will be minimized in the wound bed Date Initiated: 01/06/2020 Target Resolution Date: 01/20/2020 Goal Status: Active Interventions: Assess patient pain level pre-, during and post procedure and prior to discharge Treatment Activities: Apply topical anesthetic as ordered : 01/06/2020 Notes: Orientation to the Wound Care Program Nursing Diagnoses: Knowledge deficit related to the wound healing center program Goals: Patient/caregiver will verbalize understanding of the [facility] Date Initiated: 01/06/2020 Target Resolution Date: 01/20/2020 Goal Status: Active Interventions: Provide education on orientation to the wound center Notes: Peripheral Neuropathy Nursing Diagnoses: Knowledge deficit related to disease process and management of peripheral neurovascular dysfunction Goals: Patient/caregiver will verbalize understanding of disease process and disease management Date Initiated: 01/06/2020 Target Resolution Date: 01/20/2020 Goal Status: Active Interventions: Assess signs and symptoms of neuropathy upon admission and as needed Notes: Wound/Skin Impairment Nursing Diagnoses: Impaired tissue integrity Goals: Ulcer/skin breakdown will have a volume reduction of 30% by week 4 Robert Castro, Robert Castro ([id number]) Date Initiated: 01/06/2020 Target Resolution Date: 02/06/2020 Goal Status: Active Interventions: Assess patient/caregiver ability to  obtain necessary supplies Assess patient/caregiver ability to perform ulcer/skin care regimen upon admission and as needed Assess ulceration(s) every visit Treatment Activities: Referred to DME Carle Dargan for dressing supplies : 01/06/2020 Skin care regimen initiated : 01/06/2020 Topical wound management initiated : 01/06/2020 Notes: Electronic Signature(s) Signed: 02/23/2020 12:53:52 PM By: Gretta Cool, BSN, RN, CWS, Kim RN, BSN Entered By: Gretta Cool, BSN, RN, CWS, Kim on 01/06/2020 15:00:38 Robert Castro ([id number]) -------------------------------------------------------------------------------- Pain Assessment Details Patient Name: Robert Castro Date of Service: 01/06/2020 2:15 PM Medical Record Number: [id number] Patient Account Number: [id number] Date of Birth/Sex: 02-06-1951 (68 y.o. M) Treating RN: Cornell Barman Primary Care Angelea Penny: Bing Matter Other Clinician: Referring Amour Trigg: Bing Matter Treating Sachit Gilman/Extender: Tito Dine in Treatment: 0 Active Problems Location of Pain Severity and Description of Pain Patient Has Paino No Site Locations Pain Management and Medication Current Pain Management: Electronic Signature(s) Signed: 01/06/2020 4:54:55 PM By: Lorine Bears RCP, RRT, CHT Signed: 02/23/2020 12:53:52 PM By: Gretta Cool, BSN, RN, CWS, Kim RN, BSN Entered By: Lorine Bears on 01/06/2020 14:28:05 Robert Castro ([id number]) -------------------------------------------------------------------------------- Patient/Caregiver Education Details Patient Name: Robert Castro  Date of Service: 01/06/2020 2:15 PM Medical Record Number: [id number] Patient Account Number: [id number] Date of Birth/Gender: Jul 28, 1951 (69 y.o. M) Treating RN: Cornell Barman Primary Care Physician: Bing Matter Other Clinician: Referring Physician: Bing Matter Treating Physician/Extender: Tito Dine in Treatment: 0 Education  Assessment Education Provided To: Patient Education Topics Provided Wound/Skin Impairment: Handouts: Caring for Your Ulcer Methods: Demonstration, Explain/Verbal Responses: State content correctly Electronic Signature(s) Signed: 02/23/2020 12:53:52 PM By: Gretta Cool, BSN, RN, CWS, Kim RN, BSN Entered By: Gretta Cool, BSN, RN, CWS, Kim on 01/06/2020 15:05:01 Robert Castro ([id number]) -------------------------------------------------------------------------------- Wound Assessment Details Patient Name: Robert Castro Date of Service: 01/06/2020 2:15 PM Medical Record Number: [id number] Patient Account Number: [id number] Date of Birth/Sex: [birth year] (68 y.o. M) Treating RN: Army Melia Primary Care Cedric Mcclaine: Bing Matter Other Clinician: Referring Chaneka Trefz: Bing Matter Treating Jaslene Marsteller/Extender: Tito Dine in Treatment: 0 Wound Status Wound Number: 1 Primary Etiology: Diabetic Wound/Ulcer of the Lower Extremity Wound Location: Right, Plantar Toe Great Wound Status: Open Wounding Event: Gradually Appeared Comorbid History: Hypertension, Type II Diabetes, Osteoarthritis Date Acquired: 12/26/2019 Weeks Of Treatment: 0 Clustered Wound: No Photos Wound Measurements Length: (cm) 1 Width: (cm) 0.5 Depth: (cm) 0.1 Area: (cm) 0.393 Volume: (cm) 0.039 % Reduction in Area: % Reduction in Volume: Epithelialization: None Tunneling: No Undermining: No Wound Description Classification: Grade 2 Wound Margin: Flat and Intact Exudate Amount: Medium Exudate Type: Serosanguineous Exudate Color: red, brown Foul Odor After Cleansing: No Slough/Fibrino Yes Wound Bed Granulation Amount: Large (67-100%) Exposed Structure Granulation Quality: Red Fascia Exposed: No Necrotic Amount: Small (1-33%) Fat Layer (Subcutaneous Tissue) Exposed: Yes Necrotic Quality: Adherent Slough Tendon Exposed: No Muscle Exposed: No Joint Exposed: No Bone Exposed: No Electronic  Signature(s) Signed: 01/07/2020 11:19:24 AM By: Army Melia Entered By: Army Melia on 01/06/2020 14:45:52 Franey, Robert Castro ([id number]) -------------------------------------------------------------------------------- Vitals Details Patient Name: Robert Castro Date of Service: 01/06/2020 2:15 PM Medical Record Number: [id number] Patient Account Number: [id number] Date of Birth/Sex: 02-23-1951 (68 y.o. M) Treating RN: Cornell Barman Primary Care Katelee Schupp: Bing Matter Other Clinician: Referring Kirstyn Lean: Bing Matter Treating Thorne Wirz/Extender: Tito Dine in Treatment: 0 Vital Signs Time Taken: 14:35 Temperature (F): 98.7 Height (in): 71 Pulse (bpm): 95 Source: Stated Respiratory Rate (breaths/min): 18 Weight (lbs): 245 Blood Pressure (mmHg): 165/88 Source: Measured Reference Range: 80 - 120 mg / dl Body Mass Index (BMI): 34.2 Electronic Signature(s) Signed: 01/06/2020 4:54:55 PM By: Lorine Bears RCP, RRT, CHT Entered By: Lorine Bears on 01/06/2020 14:30:14

## 2020-02-23 NOTE — Progress Notes (Signed)
Robert, Castro ([id number]) Visit Report for 01/06/2020 Chief Complaint Document Details Patient Name: Robert Castro, Robert Castro Date of Service: 01/06/2020 2:15 PM Medical Record Number: [id number] Patient Account Number: [id number] Date of Birth/Sex: Aug 27, 1951 (68 y.o. M) Treating RN: Cornell Barman Primary Care Provider: Bing Matter Other Clinician: Referring Provider: Bing Matter Treating Provider/Extender: Tito Dine in Treatment: 0 Information Obtained from: Patient Chief Complaint 01/06/2020; patient is here for review of a wound on the plantar first toe over the proximal phalanx Electronic Signature(s) Signed: 01/07/2020 7:53:18 AM By: Linton Ham MD Entered By: Linton Ham on 01/06/2020 15:32:11 Mignogna, Norva Riffle ([id number]) -------------------------------------------------------------------------------- HPI Details Patient Name: Robert Castro Date of Service: 01/06/2020 2:15 PM Medical Record Number: [id number] Patient Account Number: [id number] Date of Birth/Sex: 03-Sep-1950 (68 y.o. M) Treating RN: Cornell Barman Primary Care Provider: Bing Matter Other Clinician: Referring Provider: Bing Matter Treating Provider/Extender: Tito Dine in Treatment: 0 History of Present Illness HPI Description: ADMISSION 01/06/2020 Patient is a 69 year old man with multiple medical problems. Among them he is type 2 diabetes with peripheral neuropathy. He is here for review of an area on the right first plantar toe over the proximal phalanx. He says he got this about 3 weeks ago when he was stepping out of the hot tub he stepped on a rock. He says he did not even feel anything was wrong. He went to see podiatry and he was prescribed Santyl although he has not yet started this. He was using mupirocin prior to this after seeing his primary doctor on 4/26. 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 o o stenosis and arthritis o Depression o o Diabetes ([place]) o o Diverticulosis o o Fatty liver o o GERD (gastroesophageal reflux disease) o o HOH (hard of hearing) o o Hyperlipidemia o o Hypertension o o Hypothyroidism o o Internal hemorrhoids o o Leg edema, left o o Macular degeneration o o Neuromuscular disorder ([place]) o o Osteoarthritis o o Sleep apnea o o cpap o Testosterone deficiency o o Tremors of nervous system o o Umbilical hernia o o ABI in our clinic was 1.16 on the right Electronic Signature(s) Signed: 01/07/2020 7:53:18 AM By: Linton Ham MD Entered By: Linton Ham on 01/06/2020 15:35:45 Watten[place]ger, Norva Riffle ([id number]) -------------------------------------------------------------------------------- Physical Exam Details Patient Name: Robert Castro Date of Service: 01/06/2020 2:15 PM Medical Record Number: [id number] Patient Account Number: [id number] Date of Birth/Sex: [birth year] (68 y.o. M) Treating RN: Cornell Barman Primary Care Provider: Bing Matter Other Clinician: Referring Provider: Bing Matter Treating Provider/Extender: Tito Dine in Treatment: 0 Constitutional Patient is hypertensive.. Pulse regular and within target range for patient.[name] Respirations regular, non-labored and within target range.. Temperature is normal and within the target range for the patient.[name] appears in no distress. Cardiovascular Pedal pulses palpable and strong bilaterally.. Neurological Marked reduction in sensation to the microfilament and light touch. Notes Wound exam; relatively clean dry looking wound on the plantar aspect of the right great toe just proximal to interphalangeal joint. There is no evidence of surrounding infection. Under illumination the surface looks dry but there is no need for debridement. Epithelialization is present. He has  thick callus over the tip of his left great toe with tinea unguium. There is no open wound here. Excoriations on his lateral calfs bilaterally mid aspect look like  scratching although he denies this. I did not see any other primary skin issues. No current evidence of infection or cellulitis Electronic Signature(s) Signed: 01/07/2020 7:53:18 AM By: Linton Ham MD Entered By: Linton Ham on 01/06/2020 15:38:21 [place], Norva Riffle ([id number]) -------------------------------------------------------------------------------- Physician Orders Details Patient Name: Robert Castro Date of Service: 01/06/2020 2:15 PM Medical Record Number: [id number] Patient Account Number: [id number] Date of Birth/Sex: 05/09/1951 (68 y.o. M) Treating RN: Cornell Barman Primary Care Provider: Bing Matter Other Clinician: Referring Provider: Bing Matter Treating Provider/Extender: Tito Dine in Treatment: 0 Verbal / Phone Orders: No Diagnosis Coding Wound Cleansing Wound #1 Right,Plantar Toe Great o Clean wound with Normal Saline. Anesthetic (add to Medication List) Wound #1 Right,Plantar Toe Great o Topical Lidocaine 4% cream applied to wound bed prior to debridement (In Clinic Only). Primary Wound Dressing Wound #1 Right,Plantar Toe Great o Silver Collagen Secondary Dressing Wound #1 Right,Plantar Toe Great o Gauze and Kerlix/Conform o Other - offloading felt Dressing Change Frequency Wound #1 Right,Plantar Toe Great o Change Dressing Monday, Wednesday, Friday Follow-up Appointments Wound #1 Right,Plantar Toe Great o Return Appointment in 1 week. Off-Loading Wound #1 Right,Plantar Toe Great o Open toe surgical shoe - Right Additional Orders / Instructions o Activity as tolerated Electronic Signature(s) Signed: 01/07/2020 7:53:18 AM By: Linton Ham MD Signed: 02/23/2020 12:53:52 PM By: Gretta Cool, BSN, RN, CWS, Kim RN, BSN Entered By: Gretta Cool, BSN, RN, CWS, Kim  on 01/06/2020 15:03:52 Jester, Norva Riffle ([id number]) -------------------------------------------------------------------------------- Problem List Details Patient Name: Robert Castro Date of Service: 01/06/2020 2:15 PM Medical Record Number: [id number] Patient Account Number: [id number] Date of Birth/Sex: April 27, 1951 (69 y.o. M) Treating RN: Cornell Barman Primary Care Provider: Bing Matter Other Clinician: Referring Provider: Bing Matter Treating Provider/Extender: Ricard Dillon Weeks in Treatment: 0 Active Problems ICD-10 Encounter Code Description Active Date MDM Diagnosis E11.621 Type 2 diabetes mellitus with foot ulcer 01/06/2020 No Yes L97.512 Non-pressure chronic ulcer of other part of right foot with fat layer 01/06/2020 No Yes exposed E11.42 Type 2 diabetes mellitus with diabetic polyneuropathy 01/06/2020 No Yes Inactive Problems Resolved Problems Electronic Signature(s) Signed: 01/07/2020 7:53:18 AM By: Linton Ham MD Entered By: Linton Ham on 01/06/2020 15:21:21 Robert Castro ([id number]) -------------------------------------------------------------------------------- Progress Note Details Patient Name: Robert Castro Date of Service: 01/06/2020 2:15 PM Medical Record Number: [id number] Patient Account Number: [id number] Date of Birth/Sex: 04/12/1951 (68 y.o. M) Treating RN: Cornell Barman Primary Care Provider: Bing Matter Other Clinician: Referring Provider: Bing Matter Treating Provider/Extender: Tito Dine in Treatment: 0 Subjective Chief Complaint Information obtained from Patient 01/06/2020; patient is here for review of a wound on the plantar first toe over the proximal phalanx History of Present Illness (HPI) ADMISSION 01/06/2020 Patient is a 69 year old man with multiple medical problems. Among them he is type 2 diabetes with peripheral neuropathy. He is here for review of an area on the right first plantar toe over the  proximal phalanx. He says he got this about 3 weeks ago when he was stepping out of the hot tub he stepped on a rock. He says he did not even feel anything was wrong. He went to see podiatry and he was prescribed Santyl although he has not yet started this. He was using mupirocin prior to this after seeing his primary doctor on 4/26. 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 Allergies No Known Allergies Family History Cancer - Mother, Kidney Disease - Siblings, No family history of Diabetes, Heart Disease, Hereditary Spherocytosis, Hypertension, Lung Disease, Seizures, Stroke, Thyroid Problems, Tuberculosis. Social History Never smoker, Marital Status - Single, Alcohol Use - Daily, Drug Use - No History, Caffeine Use - Rarely. Medical History Eyes Denies history of Cataracts, Glaucoma, Optic Neuritis Ear/Nose/Mouth/Throat IRELAND, CHAGNON ([id number]) Denies history of Chronic sinus problems/congestion, Middle ear problems Hematologic/Lymphatic Denies history of Anemia, Hemophilia, Human Immunodeficiency Virus, Lymphedema, Sickle Cell Disease Respiratory Denies history of Aspiration, Asthma, Chronic Obstructive Pulmonary Disease (COPD), Pneumothorax, Sleep Apnea, Tuberculosis Cardiovascular Patient has history of Hypertension Denies history of Angina, Arrhythmia, Congestive  Heart Failure, Coronary Artery Disease, Deep Vein Thrombosis, Hypotension, Myocardial Infarction, Peripheral Arterial Disease, Peripheral Venous Disease, Phlebitis, Vasculitis Gastrointestinal Denies history of Cirrhosis , Colitis, Crohn s, Hepatitis A, Hepatitis B, Hepatitis C Endocrine Patient has history of Type II Diabetes Denies history of Type I Diabetes Immunological Denies history of Lupus Erythematosus, Raynaud s, Scleroderma Integumentary (Skin) Denies history of History of Burn, History of pressure wounds Musculoskeletal Patient has history of Osteoarthritis Denies history of Gout, Rheumatoid Arthritis, Osteomyelitis Neurologic Denies history of Dementia, Neuropathy, Quadriplegia, Paraplegia, Seizure Disorder Oncologic Denies history of Received Chemotherapy, Received Radiation Psychiatric Denies history of Anorexia/bulimia, Confinement Anxiety Patient is treated with Oral Agents. Review of Systems (ROS) Constitutional Symptoms (General Health) Denies complaints or symptoms of Fatigue, Fever, Chills, Marked Weight Change. Eyes Denies complaints or symptoms of Dry Eyes, Vision Changes, Glasses / Contacts. Ear/Nose/Mouth/Throat Denies complaints or symptoms of Difficult clearing ears, Sinusitis. Hematologic/Lymphatic Denies complaints or symptoms of Bleeding / Clotting Disorders, Human Immunodeficiency Virus. Respiratory Denies complaints or symptoms of Chronic or frequent coughs, Shortness of Breath. Cardiovascular Denies complaints or symptoms of Chest pain, LE edema. Gastrointestinal Denies complaints or symptoms of Frequent diarrhea, Nausea, Vomiting. Endocrine Denies complaints or symptoms of Hepatitis, Thyroid disease, Polydypsia (Excessive Thirst). Immunological Denies complaints or symptoms of Hives, Itching. Integumentary (Skin) Complains or has symptoms of Wounds. Denies complaints or symptoms of Bleeding or bruising tendency, Breakdown,  Swelling. Musculoskeletal Denies complaints or symptoms of Muscle Pain, Muscle Weakness. Neurologic Denies complaints or symptoms of Numbness/parasthesias, Focal/Weakness. Psychiatric Denies complaints or symptoms of Anxiety, Claustrophobia. Objective Constitutional Patient is hypertensive.. Pulse regular and within target range for patient.[name] Respirations regular, non-labored and within target range.. Temperature is normal and within the target range for the patient.[name] appears in no distress. Vitals Time Taken: 2:35 PM, Height: 71 in, Source: Stated, Weight: 245 lbs, Source: Measured, BMI: 34.2, Temperature: 98.7 F, Pulse: 95 bpm, Respiratory Rate: 18 breaths/min, Blood Pressure: 165/88 mmHg. Cardiovascular Pedal pulses palpable and strong bilaterally.Lucia Gaskins, Norva Riffle ([id number]) Neurological Marked reduction in sensation to the microfilament and light touch. General Notes: Wound exam; relatively clean dry looking wound on the plantar aspect of the right great toe just proximal to interphalangeal joint. There is no evidence of surrounding infection. Under illumination the surface looks dry but there is no need for debridement. Epithelialization is present. He has thick callus over the tip of his left great toe with tinea unguium. There is no open wound here. Excoriations on his lateral calfs bilaterally mid aspect look like scratching although he  denies this. I did not see any other primary skin issues. No current evidence of infection or cellulitis Integumentary (Hair, Skin) Wound #1 status is Open. Original cause of wound was Gradually Appeared. The wound is located on the [employer]. The wound measures 1cm length x 0.5cm width x 0.1cm depth; 0.393cm^2 area and 0.039cm^3 volume. There is Fat Layer (Subcutaneous Tissue) Exposed exposed. There is no tunneling or undermining noted. There is a medium amount of serosanguineous drainage noted. The wound margin is flat and  intact. There is large (67-100%) red granulation within the wound bed. There is a small (1-33%) amount of necrotic tissue within the wound bed including Adherent Slough. Assessment Active Problems ICD-10 Type 2 diabetes mellitus with foot ulcer Non-pressure chronic ulcer of other part of right foot with fat layer exposed Type 2 diabetes mellitus with diabetic polyneuropathy Plan Wound Cleansing: Wound #1 Right,Plantar Toe Great: Clean wound with Normal Saline. Anesthetic (add to Medication List): Wound #1 Right,Plantar Toe Great: Topical Lidocaine 4% cream applied to wound bed prior to debridement (In Clinic Only). Primary Wound Dressing: Wound #1 Right,Plantar Toe Great: Silver Collagen Secondary Dressing: Wound #1 Right,Plantar Toe Great: Gauze and Kerlix/Conform Other - offloading felt Dressing Change Frequency: Wound #1 Right,Plantar Toe Great: Change Dressing Monday, Wednesday, Friday Follow-up Appointments: Wound #1 Right,Plantar Toe Great: Return Appointment in 1 week. Off-Loading: Wound #1 Right,Plantar Toe Great: Open toe surgical shoe - Right Additional Orders / Instructions: Activity as tolerated #1 Right plantar great toe. I am going to use moistened silver collagen, felt offloading Curlex and conform 2. He has a surgical shoe. This may not be enough to offload this. I was concerned by his history of falling, alcohol abuse with regards to putting him in more aggressive offloading either a forefoot off loader or a total contact cast. I asked him to try and keep the pressure off this is much as possible 3. He does not appear to have an arterial issue I do not think any formal testing is necessary 4. He does have peripheral neuropathy. History of multiple falls. Concerning with regards to more aggressive offloading I spent 35 minutes in review of this patient's past medical records, face-to-face evaluation and preparation of this record SEVRIN, SALLY  ([id number]) Electronic Signature(s) Signed: 01/07/2020 7:53:18 AM By: Linton Ham MD Entered By: Linton Ham on 01/06/2020 15:42:16 Struve, Norva Riffle ([id number]) -------------------------------------------------------------------------------- ROS/PFSH Details Patient Name: Robert Castro Date of Service: 01/06/2020 2:15 PM Medical Record Number: [id number] Patient Account Number: [id number] Date of Birth/Sex: 03/25/1951 (68 y.o. M) Treating RN: Army Melia Primary Care Provider: Bing Matter Other Clinician: Referring Provider: Bing Matter Treating Provider/Extender: Tito Dine in Treatment: 0 Information Obtained From Patient Constitutional Symptoms (General Health) Complaints and Symptoms: Negative for: Fatigue; Fever; Chills; Marked Weight Change Eyes Complaints and Symptoms: Negative for: Dry Eyes; Vision Changes; Glasses / Contacts Medical History: Negative for: Cataracts; Glaucoma; Optic Neuritis Ear/Nose/Mouth/Throat Complaints and Symptoms: Negative for: Difficult clearing ears; Sinusitis Medical History: Negative for: Chronic sinus problems/congestion; Middle ear problems Hematologic/Lymphatic Complaints and Symptoms: Negative for: Bleeding / Clotting Disorders; Human Immunodeficiency Virus Medical History: Negative for: Anemia; Hemophilia; Human Immunodeficiency Virus; Lymphedema; Sickle Cell Disease Respiratory Complaints and Symptoms: Negative for: Chronic or frequent coughs; Shortness of Breath Medical History: Negative for: Aspiration; Asthma; Chronic Obstructive Pulmonary Disease (COPD); Pneumothorax; Sleep Apnea; Tuberculosis Cardiovascular Complaints and Symptoms: Negative for: Chest pain; LE edema Medical History: Positive for: Hypertension Negative for: Angina; Arrhythmia; Congestive Heart Failure; Coronary Artery  Disease; Deep Vein Thrombosis; Hypotension; Myocardial Infarction; Peripheral Arterial Disease; Peripheral  Venous Disease; Phlebitis; Vasculitis Gastrointestinal Complaints and Symptoms: Negative for: Frequent diarrhea; Nausea; Vomiting Medical History: Negative for: Cirrhosis ; Colitis; Crohnos; Hepatitis A; Hepatitis B; Hepatitis C Endocrine HARLEN, DANFORD. ([id number]) Complaints and Symptoms: Negative for: Hepatitis; Thyroid disease; Polydypsia (Excessive Thirst) Medical History: Positive for: Type II Diabetes Negative for: Type I Diabetes Time with diabetes: 2 years Treated with: Oral agents Immunological Complaints and Symptoms: Negative for: Hives; Itching Medical History: Negative for: Lupus Erythematosus; Raynaudos; Scleroderma Integumentary (Skin) Complaints and Symptoms: Positive for: Wounds Negative for: Bleeding or bruising tendency; Breakdown; Swelling Medical History: Negative for: History of Burn; History of pressure wounds Musculoskeletal Complaints and Symptoms: Negative for: Muscle Pain; Muscle Weakness Medical History: Positive for: Osteoarthritis Negative for: Gout; Rheumatoid Arthritis; Osteomyelitis Neurologic Complaints and Symptoms: Negative for: Numbness/parasthesias; Focal/Weakness Medical History: Negative for: Dementia; Neuropathy; Quadriplegia; Paraplegia; Seizure Disorder Psychiatric Complaints and Symptoms: Negative for: Anxiety; Claustrophobia Medical History: Negative for: Anorexia/bulimia; Confinement Anxiety Oncologic Medical History: Negative for: Received Chemotherapy; Received Radiation Immunizations Pneumococcal Vaccine: Received Pneumococcal Vaccination: Yes Implantable Devices None Family and Social History Cancer: Yes - Mother; Diabetes: No; Heart Disease: No; Hereditary Spherocytosis: No; Hypertension: No; Kidney Disease: Yes - Siblings; Lung Disease: No; Seizures: No; Stroke: No; Thyroid Problems: No; Tuberculosis: No; Never smoker; Marital Status - Single; Alcohol Use: Daily; Drug Use: No History; Caffeine Use: Rarely;  Financial Concerns: No; Food, Clothing or Shelter Needs: No; Support System Lacking: No; Transportation Concerns: No AADON, GORELIK ([id number]) Electronic Signature(s) Signed: 01/07/2020 7:53:18 AM By: Linton Ham MD Signed: 01/07/2020 11:19:24 AM By: Army Melia Entered By: Army Melia on 01/06/2020 14:42:36 Casebolt, Norva Riffle ([id number]) -------------------------------------------------------------------------------- SuperBill Details Patient Name: Robert Castro Date of Service: 01/06/2020 Medical Record Number: [id number] Patient Account Number: [id number] Date of Birth/Sex: [birth year] (69 y.o. M) Treating RN: Cornell Barman Primary Care Provider: Bing Matter Other Clinician: Referring Provider: Bing Matter Treating Provider/Extender: Tito Dine in Treatment: 0 Diagnosis Coding ICD-10 Codes Code Description E11.621 Type 2 diabetes mellitus with foot ulcer L97.512 Non-pressure chronic ulcer of other part of right foot with fat layer exposed E11.42 Type 2 diabetes mellitus with diabetic polyneuropathy Facility Procedures CPT4 Code: [id number] Description: 99214 - WOUND CARE VISIT-LEV 4 EST PT Modifier: Quantity: 1 Physician Procedures CPT4 Code: [id number] Description: WC PHYS LEVEL 3 o NEW PT Modifier: Quantity: 1 CPT4 Code: Description: ICD-10 Diagnosis Description E11.621 Type 2 diabetes mellitus with foot ulcer L97.512 Non-pressure chronic ulcer of other part of right foot with fat laye E11.42 Type 2 diabetes mellitus with diabetic polyneuropathy Modifier: r exposed Quantity: Electronic Signature(s) Signed: 01/07/2020 7:53:18 AM By: Linton Ham MD Entered By: Linton Ham on 01/06/2020 15:41:31

## 2020-02-25 ENCOUNTER — Telehealth: Payer: Self-pay | Admitting: Hematology

## 2020-02-25 NOTE — Telephone Encounter (Signed)
Scheduled per 06/28 los, patient has been called and voicemail was left. 

## 2020-03-07 LAB — JAK2 (INCLUDING V617F AND EXON 12), MPL,& CALR-NEXT GEN SEQ

## 2020-03-09 NOTE — Progress Notes (Signed)
HEMATOLOGY/ONCOLOGY CLINIC NOTE  Date of Service: 03/11/2020  Patient Care Team: Robert Halim., PA-C as PCP - General (Family Medicine)  CHIEF COMPLAINTS/PURPOSE OF CONSULTATION:  Polycythemia   HISTORY OF PRESENTING ILLNESS:  I connected with Robert Castro on 03/11/20 at  9:40 AM EDT and verified that I am speaking with the correct person using two identifiers.   I discussed the limitations, risks, security and privacy concerns of performing an evaluation and management service by telemedicine and the availability of in-person appointments. I also discussed with the patient that there may be a patient responsible charge related to this service. The patient expressed understanding and agreed to proceed.   Other persons participating in the visit and their role in the encounter:     -Robert Castro, Medical Scribe   Patient's location: Home  Provider's location: [place] [facility] [place] is a wonderful 69 y.o. male who has been referred to [place] by Dr. Havery Castro for evaluation and management of polycythemia. The patient's last visit with [place] was on 02/22/20. The pt reports that he is doing well overall.  The pt reports he is good.   Lab results today (02/22/20) of CBC w/diff and CMP is as follows: all values are WNL except for Hemoglobin at 18.2, HCT at 54.6, Platelets at 143K, Glucose at 163, Creatinine at 1.25, AST at 73, ALT at 112, GFR, Est Non Af Am at 58 02/22/20 of JAK2, MPL, and CALR-Next Generation Sequencing -- no mutation 02/22/20 of Erythropoietin at 17: WNL  MEDICAL HISTORY:  Past Medical History:  Diagnosis Date  . Adenomatous duodenal polyp   . Arthritis   . Barrett's esophagus   . Benign enlargement of prostate   . Cataract    both eyes  . 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 Depression   . Diabetes ([place])   . Diverticulosis   . Fatty liver   . GERD (gastroesophageal reflux disease)   . HOH (hard  of hearing)   . Hyperlipidemia   . Hypertension   . Hypothyroidism   . Internal hemorrhoids   . Leg edema, left   . Macular degeneration   . Neuromuscular disorder ([place])   . Osteoarthritis   . Sleep apnea    cpap  . Testosterone deficiency   . Tremors of nervous system   . Umbilical hernia     SURGICAL HISTORY: Past Surgical History:  Procedure Laterality Date  . BACK SURGERY  2009  . BACK SURGERY  09/2014   Dr Robert Castro  . CATARACT EXTRACTION, BILATERAL  12/2016  . ESOPHAGOGASTRODUODENOSCOPY (EGD) WITH PROPOFOL N/A 09/16/2017   Procedure: ESOPHAGOGASTRODUODENOSCOPY (EGD) WITH PROPOFOL;  Surgeon: Robert Flock, MD;  Location: [facility];  Service: Gastroenterology;  Laterality: N/A;  . LUMBAR LAMINECTOMY/ DECOMPRESSION WITH MET-RX  2014  . TONSILLECTOMY  1956  . UMBILICAL HERNIA REPAIR      SOCIAL HISTORY: Social History   Socioeconomic History  . Marital status: Single    Spouse name: Not on file  . Number of children: Not on file  . Years of education: Not on file  . Highest education level: Not on file  Occupational History  . Not on file  Tobacco Use  . Smoking status: Former Smoker    Quit date: 08/25/2002    Years since quitting: 17.5  . Smokeless tobacco: Never Used  . Tobacco comment: in college only 40 years ago  Vaping  Use  . Vaping Use: Never used  Substance and Sexual Activity  . Alcohol use: Yes    Alcohol/week: 14.0 - 28.0 standard drinks    Types: 14 - 28 Standard drinks or equivalent per week    Comment: varies  daily drinker  . Drug use: No  . Sexual activity: Yes  Other Topics Concern  . Not on file  Social History Narrative  . Not on file   Social Determinants of Health   Financial Resource Strain:   . Difficulty of Paying Living Expenses:   Food Insecurity:   . Worried About Charity fundraiser in the Last Year:   . Arboriculturist in the Last Year:   Transportation Needs:   . Film/video editor (Medical):   [name] Lack of  Transportation (Non-Medical):   Physical Activity:   . Days of Exercise per Week:   . Minutes of Exercise per Session:   Stress:   . Feeling of Stress :   Social Connections:   . Frequency of Communication with Friends and Family:   . Frequency of Social Gatherings with Friends and Family:   . Attends Religious Services:   . Active Member of Clubs or Organizations:   . Attends Archivist Meetings:   [name] Marital Status:   Intimate Partner Violence:   . Fear of Current or Ex-Partner:   . Emotionally Abused:   [name] Physically Abused:   . Sexually Abused:     FAMILY HISTORY: Family History  Problem Relation Age of Onset  . Atrial fibrillation Brother   . Cancer Mother 4       abdominal cancer, unk name of cancer type  . Heart attack Maternal Grandfather   . Heart attack Paternal Grandfather   . Colon cancer Neg Hx   . Stomach cancer Neg Hx   . Colon polyps Neg Hx   . Esophageal cancer Neg Hx   . Rectal cancer Neg Hx     ALLERGIES:  has No Known Allergies.  MEDICATIONS:  Current Outpatient Medications  Medication Sig Dispense Refill  . ALPHA LIPOIC ACID PO Take 400 mg by mouth daily.    [name] amLODipine (NORVASC) 10 MG tablet Take 10 mg Daily by mouth.     [name] anastrozole (ARIMIDEX) 1 MG tablet Take 1 mg daily by mouth.    [name] ascorbic acid (VITAMIN C) 500 MG tablet Take 1,000 mg by mouth daily.    [name] aspirin EC 81 MG tablet Take 81 mg by mouth daily.    [name] atorvastatin (LIPITOR) 10 MG tablet Take 10 mg by mouth daily.     [name] buPROPion (WELLBUTRIN XL) 300 MG 24 hr tablet Take 300 mg by mouth Daily.     . calcium-vitamin D (OSCAL WITH D) 250-125 MG-UNIT tablet Take 1 tablet by mouth daily.    . chlorhexidine (PERIDEX) 0.12 % solution Use as directed 10 mLs in the mouth or throat 2 (two) times daily as needed.   0  . Cholecalciferol (VITAMIN D-1000 MAX ST) 1000 units tablet Take 1,000 Units by mouth daily.     . diazepam (VALIUM) 5 MG tablet Take 5-10 mg by mouth every 6 (six)  hours as needed (for spasms).     . diclofenac (VOLTAREN) 75 MG EC tablet Take 75 mg by mouth 2 (two) times daily as needed (for pain.).     [name] Dulaglutide (TRULICITY) 1.5 GT/3.6IW SOPN Inject into the skin.    . fluocinonide ointment ([place])  1.91 % Apply 1 application topically 2 (two) times daily as needed (IRRITATION).    . fluticasone (FLONASE) 50 MCG/ACT nasal spray Place 1 spray into both nostrils daily as needed for allergies or rhinitis.    [name] JARDIANCE 10 MG TABS tablet Take 10 mg by mouth daily.    . Lancets (ONETOUCH ULTRASOFT) lancets 1 each by Misc.(Non-Drug; Combo Route) route daily. Dx E11.9    . metFORMIN (GLUCOPHAGE-XR) 500 MG 24 hr tablet Take 1,000 mg by mouth 2 (two) times daily.    [name] MILK THISTLE PO Take 1 capsule by mouth daily.    . Multiple Vitamins-Minerals (PRESERVISION AREDS 2 PO) Take 1 capsule by mouth 2 (two) times daily.     . Multiple Vitamins-Minerals (PRESERVISION AREDS 2 PO) Take by mouth.    . olmesartan (BENICAR) 20 MG tablet Take 20 mg by mouth daily.    . Omega-3 Fatty Acids (FISH OIL) 1200 MG CAPS Take 1 capsule by mouth daily.     [name] omeprazole (PRILOSEC) 20 MG capsule Take 20 mg by mouth daily.    . sertraline (ZOLOFT) 100 MG tablet Take 200 mg by mouth daily.     [name] testosterone cypionate (DEPOTESTOTERONE CYPIONATE) 200 MG/ML injection Inject 200 mg into the muscle every 14 (fourteen) days.   1  . Thiamine HCl (B-1 PO) Take 300 mg by mouth daily.    [name] thyroid (ARMOUR) 60 MG tablet Take 60 mg by mouth daily.    . traMADol (ULTRAM) 50 MG tablet Take 50 mg by mouth every 6 (six) hours as needed (for pain.).     [name] TURMERIC PO Take 1,000 mg by mouth daily.     [name] zolpidem (AMBIEN) 10 MG tablet Take 10 mg by mouth daily as needed. For sleep     No current facility-administered medications for this visit.    REVIEW OF SYSTEMS:   A 10+ POINT REVIEW OF SYSTEMS WAS OBTAINED including neurology, dermatology, psychiatry, cardiac, respiratory, lymph, extremities,  GI, GU, Musculoskeletal, constitutional, breasts, reproductive, HEENT.  All pertinent positives are noted in the HPI.  All others are negative.   PHYSICAL EXAMINATION: ECOG PERFORMANCE STATUS: 1 - Symptomatic but completely ambulatory  Tele-Health Visit   LABORATORY DATA:  I have reviewed the data as listed  . CBC Latest Ref Rng & Units 02/22/2020 01/27/2020 12/18/2019  WBC 4.0 - 10.5 K/uL 4.8 4.9 4.5  Hemoglobin 13.0 - 17.0 g/dL 18.2(H) 18.4 Repeated and verified X2.([place]) 17.2(H)  Hematocrit 39 - 52 % 54.6(H) 54.0(H) 51.6  Platelets 150 - 400 K/uL 143(L) 134.0(L) 132.0(L)    . CMP Latest Ref Rng & Units 02/22/2020 12/18/2019 12/18/2019  Glucose 70 - 99 mg/dL 163(H) - 204(H)  BUN 8 - 23 mg/dL 19 - 23  Creatinine 0.61 - 1.24 mg/dL 1.25(H) - 1.20  Sodium 135 - 145 mmol/L 143 - 140  Potassium 3.5 - 5.1 mmol/L 4.4 - 4.1  Chloride 98 - 111 mmol/L 103 - 101  CO2 22 - 32 mmol/L 26 - 30  Calcium 8.9 - 10.3 mg/dL 9.9 - 9.6  Total Protein 6.5 - 8.1 g/dL 7.2 6.7 6.7  Total Bilirubin 0.3 - 1.2 mg/dL 0.8 1.0 1.0  Alkaline Phos 38 - 126 U/L 95 95 95  AST 15 - 41 U/L 73(H) 37 37  ALT 0 - 44 U/L 112(H) 59(H) 59(H)     RADIOGRAPHIC STUDIES: I have personally reviewed the radiological images as listed and agreed with the findings in the report. US Abdomen  Limited RUQ  Result Date: 02/15/2020 CLINICAL DATA:  Evaluate for cirrhosis or fatty liver. EXAM: ULTRASOUND ABDOMEN LIMITED RIGHT UPPER QUADRANT COMPARISON:  None. FINDINGS: Gallbladder: Multiple subcentimeter shadowing echogenic gallstones are seen within the gallbladder lumen. There is no evidence of gallbladder wall thickening (1.6 mm). No sonographic Murphy sign noted by sonographer. Common bile duct: Diameter: 4.3 mm Liver: No focal lesion identified. Diffusely increased echogenicity of the liver parenchyma is noted. Portal vein is patent on color Doppler imaging with normal direction of blood flow towards the liver. Other: None. IMPRESSION: 1.  Cholelithiasis, without evidence of acute cholecystitis. 2. Fatty liver. Electronically Signed   By: Virgina Norfolk M.D.   On: 02/15/2020 22:50    ASSESSMENT & PLAN:   69 yo with   1) POlycythemia  ?secondary- to sleep apnea, testosterone replacement, dehydration.  R/o Polycythemia vera PLAN: -Discussed pt labwork today, 02/22/20;  of CBC w/diff and CMP is as follows: all values are WNL except for Hemoglobin at 18.2, HCT at 54.6, Platelets at 143K, Glucose at 163, Creatinine at 1.25, AST at 73, ALT at 112, GFR, Est Non Af Am at 58 -Discussed 02/22/20 of JAK2, MPL, and CALR-Next Generation Sequencing at Normal -Discussed 02/22/20 of Erythropoietin at 17: WNL -Pt does not have Polycythemia Vera  -Advised increased RBC can make blood thicker and cause blood clots  -Advised on abnormal liver functions-possibly  from heavy alcohol use or testosterone replacement- Recommends f/u with PCP -Advised pt that testosterone replacement, heavy alcohol use, dehydration, and sleep apnea could cause secondary polycythemia.  -Recommend pt f/u with Bing Matter, PA-C to discuss need for repeating sleep study  -Advised pt that excess alcohol use can cause dehydration. Recommend pt cut down alcohol use.  -Recommend pt f/u with Dr. Roni Bread for Testosterone replacement management  -Recommends staying hydrated 60-64 ounces daily  -Recommends f/u Bing Matter, PA-C for abnormal liver function, minimize/stop testosterone replacement, alcohol cessation, optimize sleep apnea treatment  -Recommends donating blood several times a year if unable to minimize/stopping testosterone replacement  -Will see back as needed  FOLLOW UP: F/u with PCP and Urology  . No orders of the defined types were placed in this encounter.   The total time spent in the appt was 20 minutes and more than 50% was on counseling and direct patient cares.  All of the patient's questions were answered with apparent satisfaction. The  patient knows to call the clinic with any problems, questions or concerns.  Robert Lone MD MS AAHIVMS [facility] Hematology/Oncology Physician [facility]  (Office):       [phone] (Work cell):  [phone] (Fax):           [phone]  03/11/2020 8:29 AM  I, Robert Castro am acting as a scribe for Dr. Sullivan Castro.   .I have reviewed the above documentation for accuracy and completeness, and I agree with the above. Brunetta Genera MD

## 2020-03-10 ENCOUNTER — Telehealth: Payer: Self-pay | Admitting: Hematology

## 2020-03-11 ENCOUNTER — Inpatient Hospital Stay: Payer: Medicare Other | Attending: Hematology | Admitting: Hematology

## 2020-03-11 DIAGNOSIS — Z87891 Personal history of nicotine dependence: Secondary | ICD-10-CM

## 2020-03-11 DIAGNOSIS — D751 Secondary polycythemia: Secondary | ICD-10-CM

## 2020-03-14 ENCOUNTER — Other Ambulatory Visit: Payer: Self-pay

## 2020-03-14 ENCOUNTER — Ambulatory Visit (AMBULATORY_SURGERY_CENTER): Payer: Medicare Other | Admitting: Gastroenterology

## 2020-03-14 ENCOUNTER — Encounter: Payer: Self-pay | Admitting: Gastroenterology

## 2020-03-14 VITALS — BP 151/100 | HR 76 | Temp 97.0°F | Resp 14 | Ht 70.0 in | Wt 262.0 lb

## 2020-03-14 DIAGNOSIS — K317 Polyp of stomach and duodenum: Secondary | ICD-10-CM | POA: Diagnosis not present

## 2020-03-14 DIAGNOSIS — D132 Benign neoplasm of duodenum: Secondary | ICD-10-CM | POA: Diagnosis present

## 2020-03-14 DIAGNOSIS — K2270 Barrett's esophagus without dysplasia: Secondary | ICD-10-CM

## 2020-03-14 DIAGNOSIS — K259 Gastric ulcer, unspecified as acute or chronic, without hemorrhage or perforation: Secondary | ICD-10-CM | POA: Diagnosis not present

## 2020-03-14 DIAGNOSIS — K3189 Other diseases of stomach and duodenum: Secondary | ICD-10-CM

## 2020-03-14 MED ORDER — SODIUM CHLORIDE 0.9 % IV SOLN: 500.0000 mL | Freq: Once | INTRAVENOUS | Status: DC

## 2020-03-14 NOTE — Progress Notes (Signed)
A and O x3. Report to RN. Tolerated MAC anesthesia well.Teeth unchanged after procedure.

## 2020-03-14 NOTE — Progress Notes (Signed)
Called to room to assist during endoscopic procedure.  Patient ID and intended procedure confirmed with present staff. Received instructions for my participation in the procedure from the performing physician.  

## 2020-03-14 NOTE — Op Note (Signed)
[place] Patient Name: Robert Castro Procedure Date: 03/14/2020 4:25 PM MRN: [id number] Endoscopist: Remo Lipps P. 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 The patient's tolerance of                            previous anesthesia was also reviewed. The risks                            and benefits of the procedure and the sedation                            options and risks were discussed with the patient.                            All questions were answered, and informed consent                            was obtained. Prior Anticoagulants: The patient has                            taken no previous anticoagulant or antiplatelet                            agents. ASA Grade Assessment: III - A patient with                            severe systemic disease. After reviewing the risks                            and benefits, the patient was deemed in  satisfactory condition to undergo the procedure.                           After obtaining informed consent, the endoscope was                            passed under direct vision. Throughout the                            procedure, the patient's blood pressure, pulse, and                            oxygen saturations were monitored continuously. The                            Endoscope was  introduced through the mouth, and                            advanced to the second part of duodenum. The upper                            GI endoscopy was accomplished without difficulty.                            The patient tolerated the procedure well. Scope In: Scope Out: Findings:                 Esophagogastric landmarks were identified: the                            Z-line was found at 39 cm, the gastroesophageal                            junction was found at 40 cm and the upper extent of                            the gastric folds was found at 41 cm from the                            incisors.                           A 1 cm hiatal hernia was present.                           The Z-line was irregular with a few small islands                            of salmon colored mucosa, extending 5-62mm above                            the z line. Biopsies were taken with a cold forceps  for histology.                           The exam of the esophagus was otherwise normal.                           A few small sessile polyps were found in the                            gastric body, previously biopsied on prior exams,                            benign appearing, no further biopsies obtained.                           One superficial gastric ulcer with no stigmata of                            bleeding was found in the gastric body. The lesion                            was 3-4 mm in largest dimension.                           Nodular mucosa was found in the gastric antrum with                            superficial erosions. Unclear if this represents                            nodular GAVE?[name] Biopsies were taken with a cold                            forceps for histology.                           The exam of the stomach was otherwise normal.                           Biopsies were taken with a cold forceps in the                            gastric  body, at the incisura and in the gastric                            antrum for Helicobacter pylori testing.                           One possible area of diminutive adenomatous tissue                            was noted in the second portion of the duodenum,  and removed with cold forceps. Another area of                            altered flat mucosa with slight erythema was noted                            in close approximation, this did not appear overtly                            adenomatous but biopsy obtained. Otherwise no other                            areas of abnormality noted. .                           The exam of the duodenum was otherwise normal.                            Benign ectopic gastric mucosa noted in the duodenal                            bulb. Complications:            No immediate complications. Estimated blood loss:                            Minimal. Estimated Blood Loss:     Estimated blood loss was minimal. Impression:               - Esophagogastric landmarks identified.                           - 1 cm hiatal hernia.                           - Z-line irregular as outlined above. Biopsied.                           - A few benign appearing gastric polyps.                           - Small superficial gastric ulcer with no stigmata                            of bleeding.                           - Nodular mucosa in the gastric antrum with erosive                            changes. Biopsied.                           - Normal stomach otherwise, biopsies taken to rule  out H pylori                           - Mucosal changes in the duodenum as outlined - no                            high risk lesions appreciated. Biopsied. Recommendation:           - Patient has a contact number available for                            emergencies. The signs and symptoms of potential                             delayed complications were discussed with the                            patient. Return to normal activities tomorrow.                            Written discharge instructions were provided to the                            patient.                           - Resume previous diet.                           - Continue present medications.                           - Increase omeprazole to 40mg  twice daily for one                            month given gastric ulcer / inflammatory changes,                            then reduce to 40mg  / day                           - Avoid NSAID use                           - Await pathology results. Remo Lipps P. Cashel Bellina, MD 03/14/2020 5:07:11 PM This report has been signed electronically.

## 2020-03-14 NOTE — Patient Instructions (Signed)
Handout provided on hiatal hernia.   Increase Omeprazole to 40mg  twice daily for one month given gastric ulcer/inflammatory changes. Then reduce to 40mg  daily.   No aspirin, ibuprofen, naproxen, or other non-steriodal anti-inflammatory drugs.  YOU HAD AN ENDOSCOPIC PROCEDURE TODAY AT [facility]:   Refer to the procedure report that was given to you for any specific questions about what was found during the examination.  If the procedure report does not answer your questions, please call your gastroenterologist to clarify.  If you requested that your care partner not be given the details of your procedure findings, then the procedure report has been included in a sealed envelope for you to review at your convenience later.  YOU SHOULD EXPECT: Some feelings of bloating in the abdomen. Passage of more gas than usual.  Walking can help get rid of the air that was put into your GI tract during the procedure and reduce the bloating. If you had a lower endoscopy (such as a colonoscopy or flexible sigmoidoscopy) you may notice spotting of blood in your stool or on the toilet paper. If you underwent a bowel prep for your procedure, you may not have a normal bowel movement for a few days.  Please Note:  You might notice some irritation and congestion in your nose or some drainage.  This is from the oxygen used during your procedure.  There is no need for concern and it should clear up in a day or so.  SYMPTOMS TO REPORT IMMEDIATELY:   Following upper endoscopy (EGD)  Vomiting of blood or coffee ground material  New chest pain or pain under the shoulder blades  Painful or persistently difficult swallowing  New shortness of breath  Fever of 100F or higher  Black, tarry-looking stools  For urgent or emergent issues, a gastroenterologist can be reached at any hour by calling [phone]. Do not use MyChart messaging for urgent concerns.    DIET:  We do recommend a small meal at  first, but then you may proceed to your regular diet.  Drink plenty of fluids but you should avoid alcoholic beverages for 24 hours.  ACTIVITY:  You should plan to take it easy for the rest of today and you should NOT DRIVE or use heavy machinery until tomorrow (because of the sedation medicines used during the test).    FOLLOW UP: Our staff will call the number listed on your records 48-72 hours following your procedure to check on you and address any questions or concerns that you may have regarding the information given to you following your procedure. If we do not reach you, we will leave a message.  We will attempt to reach you two times.  During this call, we will ask if you have developed any symptoms of COVID 19. If you develop any symptoms (ie: fever, flu-like symptoms, shortness of breath, cough etc.) before then, please call [phone].  If you test positive for Covid 19 in the 2 weeks post procedure, please call and report this information to [place].    If any biopsies were taken you will be contacted by phone or by letter within the next 1-3 weeks.  Please call us at [phone] if you have not heard about the biopsies in 3 weeks.    SIGNATURES/CONFIDENTIALITY: You and/or your care partner have signed paperwork which will be entered into your electronic medical record.  These signatures attest to the fact that that the information above on your After Visit  Summary has been reviewed and is understood.  Full responsibility of the confidentiality of this discharge information lies with you and/or your care-partner.

## 2020-03-14 NOTE — Progress Notes (Signed)
robinol antisialogogue 

## 2020-03-14 NOTE — Progress Notes (Signed)
Pt's states no medical or surgical changes since previsit or office visit. 

## 2020-03-16 ENCOUNTER — Telehealth: Payer: Self-pay

## 2020-03-16 ENCOUNTER — Telehealth: Payer: Self-pay | Admitting: *Deleted

## 2020-03-16 NOTE — Telephone Encounter (Signed)
  Follow up Call-  Call back number 03/14/2020 04/24/2019 03/20/2018 07/15/2017  Post procedure Call Back phone  # [phone]  Permission to leave phone message Yes Yes Yes Yes  Some recent data might be hidden     Left message

## 2020-03-16 NOTE — Telephone Encounter (Signed)
Message left

## 2020-04-07 ENCOUNTER — Telehealth: Payer: Self-pay | Admitting: Gastroenterology

## 2020-04-26 ENCOUNTER — Other Ambulatory Visit: Payer: Self-pay

## 2020-04-26 ENCOUNTER — Ambulatory Visit (INDEPENDENT_AMBULATORY_CARE_PROVIDER_SITE_OTHER): Payer: Medicare Other | Admitting: Podiatry

## 2020-04-26 DIAGNOSIS — R269 Unspecified abnormalities of gait and mobility: Secondary | ICD-10-CM

## 2020-04-26 DIAGNOSIS — G629 Polyneuropathy, unspecified: Secondary | ICD-10-CM | POA: Diagnosis not present

## 2020-04-26 NOTE — Patient Instructions (Signed)
Diabetes Mellitus and Foot Care Foot care is an important part of your health, especially when you have diabetes. Diabetes may cause you to have problems because of poor blood flow (circulation) to your feet and legs, which can cause your skin to:  Become thinner and drier.  Break more easily.  Heal more slowly.  Peel and crack. You may also have nerve damage (neuropathy) in your legs and feet, causing decreased feeling in them. This means that you may not notice minor injuries to your feet that could lead to more serious problems. Noticing and addressing any potential problems early is the best way to prevent future foot problems. How to care for your feet Foot hygiene  Wash your feet daily with warm water and mild soap. Do not use hot water. Then, pat your feet and the areas between your toes until they are completely dry. Do not soak your feet as this can dry your skin.  Trim your toenails straight across. Do not dig under them or around the cuticle. File the edges of your nails with an emery board or nail file.  Apply a moisturizing lotion or petroleum jelly to the skin on your feet and to dry, brittle toenails. Use lotion that does not contain alcohol and is unscented. Do not apply lotion between your toes. Shoes and socks  Wear clean socks or stockings every day. Make sure they are not too tight. Do not wear knee-high stockings since they may decrease blood flow to your legs.  Wear shoes that fit properly and have enough cushioning. Always look in your shoes before you put them on to be sure there are no objects inside.  To break in new shoes, wear them for just a few hours a day. This prevents injuries on your feet. Wounds, scrapes, corns, and calluses  Check your feet daily for blisters, cuts, bruises, sores, and redness. If you cannot see the bottom of your feet, use a mirror or ask someone for help.  Do not cut corns or calluses or try to remove them with medicine.  If you  find a minor scrape, cut, or break in the skin on your feet, keep it and the skin around it clean and dry. You may clean these areas with mild soap and water. Do not clean the area with peroxide, alcohol, or iodine.  If you have a wound, scrape, corn, or callus on your foot, look at it several times a day to make sure it is healing and not infected. Check for: ? Redness, swelling, or pain. ? Fluid or blood. ? Warmth. ? Pus or a bad smell. General instructions  Do not cross your legs. This may decrease blood flow to your feet.  Do not use heating pads or hot water bottles on your feet. They may burn your skin. If you have lost feeling in your feet or legs, you may not know this is happening until it is too late.  Protect your feet from hot and cold by wearing shoes, such as at the beach or on hot pavement.  Schedule a complete foot exam at least once a year (annually) or more often if you have foot problems. If you have foot problems, report any cuts, sores, or bruises to your health care provider immediately. Contact a health care provider if:  You have a medical condition that increases your risk of infection and you have any cuts, sores, or bruises on your feet.  You have an injury that is not   healing.  You have redness on your legs or feet.  You feel burning or tingling in your legs or feet.  You have pain or cramps in your legs and feet.  Your legs or feet are numb.  Your feet always feel cold.  You have pain around a toenail. Get help right away if:  You have a wound, scrape, corn, or callus on your foot and: ? You have pain, swelling, or redness that gets worse. ? You have fluid or blood coming from the wound, scrape, corn, or callus. ? Your wound, scrape, corn, or callus feels warm to the touch. ? You have pus or a bad smell coming from the wound, scrape, corn, or callus. ? You have a fever. ? You have a red line going up your leg. Summary  Check your feet every day  for cuts, sores, red spots, swelling, and blisters.  Moisturize feet and legs daily.  Wear shoes that fit properly and have enough cushioning.  If you have foot problems, report any cuts, sores, or bruises to your health care provider immediately.  Schedule a complete foot exam at least once a year (annually) or more often if you have foot problems. This information is not intended to replace advice given to you by your health care provider. Make sure you discuss any questions you have with your health care provider. Document Revised: 05/06/2019 Document Reviewed: 09/14/2016 Elsevier Patient Education  2020 Elsevier Inc.  

## 2020-04-29 NOTE — Progress Notes (Signed)
Subjective:   Patient ID: Robert Castro, male   DOB: 69 y.o.   MRN: [id number]   HPI 69 year old male presents the office today for concerns of toenail fungus.  He states he is wanting a second opinion regards to nail fungus.  He previously was on Lamisil but was not helpful.  He then followed up with another podiatrist apparently wound culture was obtained which did not show fungus he was started on urea cream.  He states the nails are still thick and yellow and he wants to discuss other treatment options.  The nails do cause discomfort with pressure in shoes.  Also states that he has neuropathy and he feels that he is off balance at times.  No recent falls. Review of Systems  All other systems reviewed and are negative.  Past Medical History:  Diagnosis Date  . Adenomatous duodenal polyp   . Arthritis   . Barrett's esophagus   . Benign enlargement of prostate   . Cataract    both eyes  . 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 Depression   . Diabetes ([place])   . Diverticulosis   . Fatty liver   . GERD (gastroesophageal reflux disease)   . HOH (hard of hearing)   . Hyperlipidemia   . Hypertension   . Hypothyroidism   . Internal hemorrhoids   . Leg edema, left   . Macular degeneration   . Neuromuscular disorder ([place])   . Osteoarthritis   . Sleep apnea    cpap  . Testosterone deficiency   . Tremors of nervous system   . Umbilical hernia     Past Surgical History:  Procedure Laterality Date  . BACK SURGERY  2009  . BACK SURGERY  09/2014   Dr Marykay Lex  . CATARACT EXTRACTION, BILATERAL  12/2016  . ESOPHAGOGASTRODUODENOSCOPY (EGD) WITH PROPOFOL N/A 09/16/2017   Procedure: ESOPHAGOGASTRODUODENOSCOPY (EGD) WITH PROPOFOL;  Surgeon: Yetta Flock, MD;  Location: [facility];  Service: Gastroenterology;  Laterality: N/A;  . LUMBAR LAMINECTOMY/ DECOMPRESSION WITH MET-RX  2014  . TONSILLECTOMY  1956  . UMBILICAL HERNIA REPAIR        Current Outpatient Medications:  .  ALPHA LIPOIC ACID PO, Take 400 mg by mouth daily., Disp: , Rfl:  .  amLODipine (NORVASC) 10 MG tablet, Take 10 mg Daily by mouth. , Disp: , Rfl:  .  anastrozole (ARIMIDEX) 1 MG tablet, Take 1 mg daily by mouth., Disp: , Rfl:  .  ascorbic acid (VITAMIN C) 500 MG tablet, Take 1,000 mg by mouth daily., Disp: , Rfl:  .  aspirin EC 81 MG tablet, Take 81 mg by mouth daily., Disp: , Rfl:  .  atorvastatin (LIPITOR) 10 MG tablet, Take 10 mg by mouth daily. , Disp: , Rfl:  .  buPROPion (WELLBUTRIN XL) 150 MG 24 hr tablet, Take 150 mg by mouth every morning., Disp: , Rfl:  .  buPROPion (WELLBUTRIN XL) 300 MG 24 hr tablet, Take 300 mg by mouth Daily. , Disp: , Rfl:  .  calcium-vitamin D (OSCAL WITH D) 250-125 MG-UNIT tablet, Take 1 tablet by mouth daily., Disp: , Rfl:  .  cephALEXin (KEFLEX) 500 MG capsule, Take 1,000 mg by mouth 2 (two) times daily., Disp: , Rfl:  .  chlorhexidine (PERIDEX) 0.12 % solution, Use as directed 10 mLs in the mouth or throat 2 (two) times daily as needed. , Disp: , Rfl: 0 .  Cholecalciferol (VITAMIN D-1000  MAX ST) 1000 units tablet, Take 1,000 Units by mouth daily. , Disp: , Rfl:  .  diazepam (VALIUM) 5 MG tablet, Take 5-10 mg by mouth every 6 (six) hours as needed (for spasms). , Disp: , Rfl:  .  diclofenac (VOLTAREN) 75 MG EC tablet, Take 75 mg by mouth 2 (two) times daily as needed (for pain.). , Disp: , Rfl:  .  Dulaglutide (TRULICITY) 1.5 FF/6.3WG SOPN, Inject into the skin., Disp: , Rfl:  .  fluocinonide ointment (LIDEX) 6.65 %, Apply 1 application topically 2 (two) times daily as needed (IRRITATION)., Disp: , Rfl:  .  fluticasone (FLONASE) 50 MCG/ACT nasal spray, Place 1 spray into both nostrils daily as needed for allergies or rhinitis., Disp: , Rfl:  .  JARDIANCE 10 MG TABS tablet, Take 10 mg by mouth daily., Disp: , Rfl:  .  Lancets (ONETOUCH ULTRASOFT) lancets, 1 each by Misc.(Non-Drug; Combo Route) route daily. Dx E11.9,  Disp: , Rfl:  .  metFORMIN (GLUCOPHAGE-XR) 500 MG 24 hr tablet, Take 1,000 mg by mouth 2 (two) times daily., Disp: , Rfl:  .  MILK THISTLE PO, Take 1 capsule by mouth daily., Disp: , Rfl:  .  Multiple Vitamins-Minerals (PRESERVISION AREDS 2 PO), Take 1 capsule by mouth 2 (two) times daily. , Disp: , Rfl:  .  Multiple Vitamins-Minerals (PRESERVISION AREDS 2 PO), Take by mouth., Disp: , Rfl:  .  mupirocin ointment (BACTROBAN) 2 %, Apply topically 2 (two) times daily. to affected area, Disp: , Rfl:  .  olmesartan (BENICAR) 20 MG tablet, Take 20 mg by mouth daily., Disp: , Rfl:  .  Omega-3 Fatty Acids (FISH OIL) 1200 MG CAPS, Take 1 capsule by mouth daily. , Disp: , Rfl:  .  omeprazole (PRILOSEC) 20 MG capsule, Take 1 capsule by mouth daily., Disp: , Rfl:  .  SANTYL ointment, , Disp: , Rfl:  .  sertraline (ZOLOFT) 100 MG tablet, Take 200 mg by mouth daily. , Disp: , Rfl:  .  testosterone cypionate (DEPOTESTOTERONE CYPIONATE) 200 MG/ML injection, Inject 200 mg into the muscle every 14 (fourteen) days. , Disp: , Rfl: 1 .  Thiamine HCl (B-1 PO), Take 300 mg by mouth daily., Disp: , Rfl:  .  thyroid (ARMOUR) 60 MG tablet, Take 60 mg by mouth daily., Disp: , Rfl:  .  traMADol (ULTRAM) 50 MG tablet, Take 50 mg by mouth every 6 (six) hours as needed (for pain.). , Disp: , Rfl:  .  TURMERIC PO, Take 1,000 mg by mouth daily. , Disp: , Rfl:  .  zolpidem (AMBIEN) 10 MG tablet, Take 10 mg by mouth daily as needed. For sleep, Disp: , Rfl:   No Known Allergies      Objective:  Physical Exam  General: AAO x3, NAD  Dermatological: Nails appear be hypertrophic, dystrophic and there is yellow discoloration of the distal aspect the nails.  Subungual debris is present.  There is no edema, erythema.  Tenderness nails 1-5 bilaterally.  No open lesions.  Vascular: Dorsalis Pedis artery and Posterior Tibial artery pedal pulses are 2/4 bilateral with immedate capillary fill time. There is no pain with calf  compression, swelling, warmth, erythema.   Neruologic: Sensation decreased with Semmes Weinstein monofilament.  Previously been diagnosed with neuropathy  Musculoskeletal: Muscular strength 5/5 in all groups tested bilateral.  Gait: Unassisted, Nonantalgic.       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  He wants to discuss other options.  [facility] for onychomycosis that would also include urea. -Physical therapy ordered for gait training  Trula Slade DPM

## 2020-05-10 ENCOUNTER — Encounter: Payer: Self-pay | Admitting: Podiatry

## 2020-05-10 NOTE — Progress Notes (Addendum)
Received notification that PT has tried to contact the patient to schedule PT but unable to reach him  Pt scheduled for 9/27

## 2020-05-19 ENCOUNTER — Ambulatory Visit: Payer: Medicare Other | Admitting: Physician Assistant

## 2020-07-19 ENCOUNTER — Ambulatory Visit: Payer: Medicare Other | Admitting: Physician Assistant

## 2020-11-09 ENCOUNTER — Ambulatory Visit: Payer: Medicare Other | Admitting: Physician Assistant

## 2020-11-10 ENCOUNTER — Ambulatory Visit (INDEPENDENT_AMBULATORY_CARE_PROVIDER_SITE_OTHER): Payer: Medicare Other | Admitting: Physician Assistant

## 2020-11-10 ENCOUNTER — Encounter: Payer: Self-pay | Admitting: Physician Assistant

## 2020-11-10 ENCOUNTER — Other Ambulatory Visit (INDEPENDENT_AMBULATORY_CARE_PROVIDER_SITE_OTHER): Payer: Medicare Other

## 2020-11-10 VITALS — BP 140/82 | HR 76 | Ht 70.0 in | Wt 262.0 lb

## 2020-11-10 DIAGNOSIS — R152 Fecal urgency: Secondary | ICD-10-CM

## 2020-11-10 DIAGNOSIS — K760 Fatty (change of) liver, not elsewhere classified: Secondary | ICD-10-CM | POA: Diagnosis not present

## 2020-11-10 DIAGNOSIS — R197 Diarrhea, unspecified: Secondary | ICD-10-CM | POA: Diagnosis not present

## 2020-11-10 LAB — COMPREHENSIVE METABOLIC PANEL
ALT: 66 U/L — ABNORMAL HIGH (ref 0–53)
AST: 47 U/L — ABNORMAL HIGH (ref 0–37)
Albumin: 4.5 g/dL (ref 3.5–5.2)
Alkaline Phosphatase: 76 U/L (ref 39–117)
BUN: 17 mg/dL (ref 6–23)
CO2: 28 mEq/L (ref 19–32)
Calcium: 9.4 mg/dL (ref 8.4–10.5)
Chloride: 101 mEq/L (ref 96–112)
Creatinine, Ser: 1.07 mg/dL (ref 0.40–1.50)
GFR: 69.93 mL/min (ref >60.00)
Glucose, Bld: 226 mg/dL — ABNORMAL HIGH (ref 70–99)
Potassium: 4 mEq/L (ref 3.5–5.1)
Sodium: 137 mEq/L (ref 135–145)
Total Bilirubin: 1.1 mg/dL (ref 0.2–1.2)
Total Protein: 6.9 g/dL (ref 6.0–8.3)

## 2020-11-10 LAB — CBC WITH DIFFERENTIAL/PLATELET
Basophils Absolute: 0 10*3/uL (ref 0.0–0.1)
Basophils Relative: 1 % (ref 0.0–3.0)
Eosinophils Absolute: 0.1 10*3/uL (ref 0.0–0.7)
Eosinophils Relative: 2.6 % (ref 0.0–5.0)
HCT: 50.8 % (ref 39.0–52.0)
Hemoglobin: 17.4 g/dL — ABNORMAL HIGH (ref 13.0–17.0)
Lymphocytes Relative: 37.5 % (ref 12.0–46.0)
Lymphs Abs: 1.6 10*3/uL (ref 0.7–4.0)
MCHC: 34.3 g/dL (ref 30.0–36.0)
MCV: 99.5 fl (ref 78.0–100.0)
Monocytes Absolute: 0.6 10*3/uL (ref 0.1–1.0)
Monocytes Relative: 13.4 % — ABNORMAL HIGH (ref 3.0–12.0)
Neutro Abs: 1.9 10*3/uL (ref 1.4–7.7)
Neutrophils Relative %: 45.5 % (ref 43.0–77.0)
Platelets: 112 10*3/uL — ABNORMAL LOW (ref 150.0–400.0)
RBC: 5.1 Mil/uL (ref 4.22–5.81)
RDW: 13.2 % (ref 11.5–15.5)
WBC: 4.3 10*3/uL (ref 4.0–10.5)

## 2020-11-10 LAB — PROTIME-INR
INR: 1.1 ratio — ABNORMAL HIGH (ref 0.8–1.0)
Prothrombin Time: 12.3 s (ref 9.6–13.1)

## 2020-11-10 MED ORDER — GLYCOPYRROLATE 2 MG PO TABS: 2.0000 mg | ORAL_TABLET | Freq: Every morning | ORAL | 1 refills | Status: DC

## 2020-11-10 NOTE — Progress Notes (Signed)
Agree with assessment and plan as outlined.  

## 2020-11-10 NOTE — Patient Instructions (Addendum)
If you are age 72 or older, your body mass index should be between 23-30. Your Body mass index is 37.59 kg/m. If this is out of the aforementioned range listed, please consider follow up with your Primary Care Provider.  If you are age 31 or younger, your body mass index should be between 19-25. Your Body mass index is 37.59 kg/m. If this is out of the aformentioned range listed, please consider follow up with your Primary Care Provider.   Your provider has requested that you go to the basement level for lab work before leaving today. Press "B" on the elevator. The lab is located at the first door on the left as you exit the elevator.  You will be contacted by [facility] in the next 2 days to arrange a Abdominal US.  The number on your caller ID will be [phone], please answer when they call.  If you have not heard from them in 2 days please call [phone] to schedule.    START Glycopyrrolate 2 mg 1 tablet every morning.  Avoid Lactose. Decrease Alcohol Stop Artificial sweeteners  You have been scheduled to follow up with Dr. Havery Moros on Dec 29, 2020 at 11:00 am  Thank you for entrusting me with your care and choosing [facility].  Amy Esterwood, PA-C

## 2020-11-10 NOTE — Progress Notes (Signed)
Subjective:    Patient ID: Robert Castro, male    DOB: [birth year], 70 y.o.   MRN: [id number]  HPI Robert Castro is a 70 year old white male, established with Dr. Havery Moros who comes in today for follow-up.  He is requesting follow-up of his liver, and also has concerns with increased issues with fecal urgency and diarrhea which have worsened over the past several months.  He says he has had intermittent issues in the past but now over the past several months has been having mild abdominal cramping, and urgency for bowel movements at least 75% of the time.  He says this usually results in a somewhat explosive loose stool.  He is having symptoms on a daily basis, he says sometimes he will have a normal bowel movement.  On further questioning he does feel that the stool is floating and may appear oily.  He has not noted any melena or hematochezia.  He has been using Imodium on a as needed basis.  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 Appetite has been fine.  He has not had any weight loss.  He is on multiple medications but says he is on no new medications and all are at stable dosages. Regarding his alcohol intake he says he continues to drink on a daily basis, he is somewhat vague but says he usually will have 4 drinks per day sometimes less sometimes more. He has also been drinking a lot of diet sodas.  His fiance mentioned that he may have problems with lactose, he is not certain that he can correlate lactose intake with his current symptoms. Patient has history of Barrett's esophagus and numerous adenomatous colon polyps and also has had duodenal adenomas.  He has adult onset diabetes mellitus, obesity, and history of polycythemia. Last colonoscopy was done in August 2020 for follow-up of numerous adenomatous polyps.  He was noted to have a large AVM in the cecum nonbleeding, 6 polyps were removed the largest 3 to 4 mm in size and all  were tubular adenomas.  Also noted to have multiple diverticuli he is indicated for 3-year interval follow-up. Last EGD July 2021 for follow-up of EMR done at [facility] in 2019.  He was noted to have a few sessile gastric polyps in the body and one superficial gastric ulcer also questioned G AVE in the antrum.  He had 1 diminutive area of possible adenomatous tissue in the second portion of the duodenum this was biopsied and showed no evidence of adenomatous tissue.  He is indicated for follow-up EGD in 2023. He has been followed for fatty liver disease felt combination of probable NAFLD,and EtOH Last abdominal ultrasound June 2021 showed multiple gallstones, no gallbladder wall thickening and noted to have a fatty liver but no definite cirrhosis  Review of Systems Pertinent positive and negative review of systems were noted in the above HPI section.  All other review of systems was otherwise negative.  Outpatient Encounter Medications as of 11/10/2020  Medication Sig  . ALPHA LIPOIC ACID PO Take 400 mg by mouth daily.  [name] amLODipine (NORVASC) 10 MG tablet Take 10 mg Daily by mouth.   [name] anastrozole (ARIMIDEX) 1 MG tablet Take 1 mg daily by mouth.  [name] ascorbic acid (VITAMIN C) 500 MG tablet Take 1,000 mg by mouth daily.  [name] aspirin EC 81 MG tablet Take 81 mg by mouth daily.  [name] atorvastatin (LIPITOR) 10 MG tablet  Take 10 mg by mouth daily.   [name] buPROPion (WELLBUTRIN XL) 150 MG 24 hr tablet Take 150 mg by mouth every morning.  [name] buPROPion (WELLBUTRIN XL) 300 MG 24 hr tablet Take 300 mg by mouth Daily.   . calcium-vitamin D (OSCAL WITH D) 250-125 MG-UNIT tablet Take 1 tablet by mouth daily.  . cephALEXin (KEFLEX) 500 MG capsule Take 1,000 mg by mouth 2 (two) times daily.  . chlorhexidine (PERIDEX) 0.12 % solution Use as directed 10 mLs in the mouth or throat 2 (two) times daily as needed.   . Cholecalciferol 25 MCG (1000 UT) tablet Take 1,000 Units by mouth daily.   . diazepam (VALIUM) 5 MG tablet Take 5-10  mg by mouth every 6 (six) hours as needed (for spasms).   . diclofenac (VOLTAREN) 75 MG EC tablet Take 75 mg by mouth 2 (two) times daily as needed (for pain.).   [name] Dulaglutide (TRULICITY) 1.5 IW/9.7LG SOPN Inject into the skin.  . fluocinonide ointment (LIDEX) 9.21 % Apply 1 application topically 2 (two) times daily as needed (IRRITATION).  . fluticasone (FLONASE) 50 MCG/ACT nasal spray Place 1 spray into both nostrils daily as needed for allergies or rhinitis.  [name] glycopyrrolate (ROBINUL) 2 MG tablet Take 1 tablet (2 mg total) by mouth in the morning.  [name] JARDIANCE 10 MG TABS tablet Take 10 mg by mouth daily.  . Lancets (ONETOUCH ULTRASOFT) lancets 1 each by Misc.(Non-Drug; Combo Route) route daily. Dx E11.9  . metFORMIN (GLUCOPHAGE-XR) 500 MG 24 hr tablet Take 1,000 mg by mouth 2 (two) times daily.  [name] MILK THISTLE PO Take 1 capsule by mouth daily.  . Multiple Vitamins-Minerals (PRESERVISION AREDS 2 PO) Take 1 capsule by mouth 2 (two) times daily.   . Multiple Vitamins-Minerals (PRESERVISION AREDS 2 PO) Take by mouth.  . mupirocin ointment (BACTROBAN) 2 % Apply topically 2 (two) times daily. to affected area  . olmesartan (BENICAR) 20 MG tablet Take 20 mg by mouth daily.  . Omega-3 Fatty Acids (FISH OIL) 1200 MG CAPS Take 1 capsule by mouth daily.   [name] omeprazole (PRILOSEC) 20 MG capsule Take 1 capsule by mouth daily.  [name] SANTYL ointment   . sertraline (ZOLOFT) 100 MG tablet Take 200 mg by mouth daily.   . tamsulosin (FLOMAX) 0.4 MG CAPS capsule Take 0.4 mg by mouth daily.  [name] testosterone cypionate (DEPOTESTOTERONE CYPIONATE) 200 MG/ML injection Inject 200 mg into the muscle every 14 (fourteen) days.   . Thiamine HCl (B-1 PO) Take 300 mg by mouth daily.  [name] thyroid (ARMOUR) 60 MG tablet Take 60 mg by mouth daily.  . traMADol (ULTRAM) 50 MG tablet Take 50 mg by mouth every 6 (six) hours as needed (for pain.).   [name] TURMERIC PO Take 1,000 mg by mouth daily.   [name] zolpidem (AMBIEN) 10 MG tablet Take 10  mg by mouth daily as needed. For sleep   No facility-administered encounter medications on file as of 11/10/2020.   No Known Allergies Patient Active Problem List   Diagnosis Date Noted  . Genetic testing 05/01/2018  . Adenomatous duodenal polyp   . Diarrhea 05/05/2015  . GERD 12/01/2007  . DYSPHAGIA UNSPECIFIED 12/01/2007  . INTERNAL HEMORRHOIDS 03/06/2007  . BARRETTS ESOPHAGUS 01/29/2007  . DIVERTICULOSIS, COLON 08/01/2005  . COLONIC POLYPS, ADENOMATOUS 07/09/2002   Social History   Socioeconomic History  . Marital status: Single    Spouse name: Not on file  . Number of children: 0  . Years of education:  16  . Highest education level: Not on file  Occupational History  . Occupation: Art therapist and performer  Tobacco Use  . Smoking status: Former Smoker    Quit date: 08/25/2002    Years since quitting: 18.2  . Smokeless tobacco: Never Used  . Tobacco comment: in college only 40 years ago  Vaping Use  . Vaping Use: Never used  Substance and Sexual Activity  . Alcohol use: Yes    Alcohol/week: 14.0 - 28.0 standard drinks    Types: 14 - 28 Standard drinks or equivalent per week    Comment: varies  daily drinker  . Drug use: No  . Sexual activity: Not Currently  Other Topics Concern  . Not on file  Social History Narrative   Has a fiance- Darrelyn Hillock   Social Determinants of Health   Financial Resource Strain: Not on file  Food Insecurity: Not on file  Transportation Needs: Not on file  Physical Activity: Not on file  Stress: Not on file  Social Connections: Not on file  Intimate Partner Violence: Not on file    Robert Castro family history includes Atrial fibrillation in his brother; Cancer (age of onset: 76) in his mother; Heart attack in his maternal grandfather and paternal grandfather; Other in his father.      Objective:    Vitals:   11/10/20 1338  BP: 140/82  Pulse: 76    Physical Exam Well-developed well-nourished older WM in no acute  distress.  Height, VHQION,629  BMI37  HEENT; nontraumatic normocephalic, EOMI, PE R LA, sclera anicteric. Oropharynx; not examined Neck; supple, no JVD Cardiovascular; regular rate and rhythm with S1-S2, no murmur rub or gallop Pulmonary; Clear bilaterally Abdomen; obese, nondistended, nontender no palpable mass or hepatosplenomegaly, bowel sounds are active Rectal; not done today Skin; benign exam, no jaundice rash or appreciable lesions Extremities; no clubbing cyanosis or edema skin warm and dry Neuro/Psych; alert and oriented x4, grossly nonfocal mood and affect appropriate       Assessment & Plan:   #7 70 year old white male with development of fecal urgency and very frequent loose stools over the past several months. Etiology not clear he does have history of similar less severe symptoms intermittently in the past. Rule out pancreatic insufficiency, rule out IBS, rule out medication induced (on Glucophage) Rule out component of lactose intolerance Rule out secondary to significant EtOH intake  #2 history of fatty liver, no definite cirrhosis #3 diverticulosis 4.  Short segment Barrett's #5 history of duodenal adenoma status post EMR 2019 #6 possible G AVE gastric antrum #7 history of numerous adenomatous colon polyps up-to-date with colonoscopy last done August 2020 and indicated for 3-year interval follow-up August 2023 #8 cecal AVM #9 adult onset diabetes mellitus 10.  Obesity 11.  EtOH abuse 12.  Polycythemia  #13  BPH  Plan; CBC with differential, c-Met, pro time/INR We will schedule for follow-up abdominal ultrasound in about 1 month. Patient advised to significantly decrease his alcohol intake which he has not been able to do  To date Avoid lactose Stop diet sodas Will check stool for lactoferrin and fecal elastase We will give a trial of low-dose glycopyrrolate 2 mg p.o. every morning for urgency and diarrhea.  Patient was cautioned that if he has any  difficulty urinating or inability to empty his bladder to stop the medication.  Alternatively he could use Imodium 1 every morning and then take a second dose as needed later in the day as needed. We  will plan office follow-up with Dr. Havery Moros in about 6 weeks Follow-up EGD and colonoscopy summer 2023     Sunni Richardson S [place] PA-C 11/10/2020   Cc: Aletha Halim., PA-C

## 2020-11-11 ENCOUNTER — Telehealth: Payer: Self-pay | Admitting: Physician Assistant

## 2020-11-11 NOTE — Telephone Encounter (Signed)
Pt called to inform that his pharmacy told him that Glycopyrrolate needs PA and they already faxed the form over to the office.

## 2020-11-11 NOTE — Telephone Encounter (Signed)
Prior Authorization has been submitted and approved. Patient has been notified.

## 2020-11-14 ENCOUNTER — Other Ambulatory Visit: Payer: Medicare Other

## 2020-11-14 DIAGNOSIS — R197 Diarrhea, unspecified: Secondary | ICD-10-CM

## 2020-11-14 DIAGNOSIS — R152 Fecal urgency: Secondary | ICD-10-CM

## 2020-11-14 DIAGNOSIS — K760 Fatty (change of) liver, not elsewhere classified: Secondary | ICD-10-CM

## 2020-11-16 ENCOUNTER — Ambulatory Visit (HOSPITAL_COMMUNITY)
Admission: RE | Admit: 2020-11-16 | Discharge: 2020-11-16 | Disposition: A | Discharge status: Home / Self Care | Payer: Medicare Other | Source: Ambulatory Visit | Attending: Physician Assistant | Admitting: Physician Assistant

## 2020-11-16 ENCOUNTER — Other Ambulatory Visit: Payer: Self-pay

## 2020-11-16 DIAGNOSIS — R197 Diarrhea, unspecified: Secondary | ICD-10-CM

## 2020-11-16 DIAGNOSIS — K760 Fatty (change of) liver, not elsewhere classified: Secondary | ICD-10-CM | POA: Diagnosis not present

## 2020-11-16 DIAGNOSIS — R152 Fecal urgency: Secondary | ICD-10-CM | POA: Diagnosis present

## 2020-11-18 ENCOUNTER — Telehealth: Payer: Self-pay | Admitting: Physician Assistant

## 2020-11-18 NOTE — Telephone Encounter (Signed)
Left detailed message on pts voicemail on results from 3/17 and let him know the other stool tests are still in process.

## 2020-11-18 NOTE — Telephone Encounter (Signed)
Inbound call from patient requesting a call back with lab results please. 

## 2020-11-20 LAB — PANCREATIC ELASTASE, FECAL: Pancreatic Elastase-1, Stool: 31 mcg/g — ABNORMAL LOW

## 2020-11-20 LAB — FECAL LACTOFERRIN, QUANT
Fecal Lactoferrin: NEGATIVE
MICRO NUMBER:: 11671085
SPECIMEN QUALITY:: ADEQUATE

## 2020-11-30 ENCOUNTER — Other Ambulatory Visit: Payer: Self-pay

## 2020-11-30 MED ORDER — ZENPEP 3000-10000 UNITS PO CPEP: ORAL_CAPSULE | ORAL | 3 refills | Status: DC

## 2020-11-30 NOTE — Telephone Encounter (Signed)
Left message for patient to call back  

## 2020-11-30 NOTE — Telephone Encounter (Signed)
See results notes on labs from 3/21.  All additional questions answered. He will call back for additional questions or concerns.

## 2020-12-02 ENCOUNTER — Telehealth: Payer: Self-pay | Admitting: Physician Assistant

## 2020-12-02 NOTE — Telephone Encounter (Signed)
Pt called stating that he has pancreatic deficiency and sxs are getting worse. He would like some advise.

## 2020-12-02 NOTE — Telephone Encounter (Signed)
Left message for patient to call back  

## 2020-12-29 ENCOUNTER — Ambulatory Visit: Payer: Medicare Other | Admitting: Gastroenterology

## 2021-01-05 ENCOUNTER — Ambulatory Visit (INDEPENDENT_AMBULATORY_CARE_PROVIDER_SITE_OTHER): Payer: Medicare Other | Admitting: Gastroenterology

## 2021-01-05 ENCOUNTER — Encounter: Payer: Self-pay | Admitting: Gastroenterology

## 2021-01-05 VITALS — BP 142/88 | HR 102 | Ht 70.0 in | Wt 253.0 lb

## 2021-01-05 DIAGNOSIS — K8681 Exocrine pancreatic insufficiency: Secondary | ICD-10-CM | POA: Diagnosis not present

## 2021-01-05 DIAGNOSIS — Z7289 Other problems related to lifestyle: Secondary | ICD-10-CM | POA: Diagnosis not present

## 2021-01-05 DIAGNOSIS — K529 Noninfective gastroenteritis and colitis, unspecified: Secondary | ICD-10-CM

## 2021-01-05 DIAGNOSIS — Z789 Other specified health status: Secondary | ICD-10-CM

## 2021-01-05 MED ORDER — ZENPEP 40000-126000 UNITS PO CPEP: ORAL_CAPSULE | ORAL | 3 refills | Status: DC

## 2021-01-05 NOTE — Progress Notes (Signed)
HPI :  70 year old male here for a follow-up visit.  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  History of colon polyps, history of duodenal adenomas.  He was seen by Nicoletta Ba this past March with urgent loose stools.  He described seeing oil in his stools that floated in the toilet water.  He was evaluated with stool studies.  Stool lactoferrin was negative, pancreatic fecal elastase was markedly low at 31.  He has no anemia, white blood cell count normal.  He was given a trial of Creon, appears that he was given 3000 unit capsules, taking 2 capsules with meals.  He states this has helped him and has decreased the urgency and loose stools but has not resolved that.  He questions if he is on the correct dose from his reading about this.  He was given a trial of glycopyrrolate but stopped it.  He denies any abdominal pains.  No weight loss.  He denies any family history of pancreatic cancer.  He is concerned about his pancreas and risk for pancreatic cancer.  He has had a history of significant alcohol use in the past but has cut back and stopped for a period of time, then resumed at a lower level but more recently has stopped completely.  He is not smoking tobacco.  He does have a history of diclofenac use but has cut back and not using it routinely at all, if he uses this will use Voltaren gel.  Does have a history of diabetes is on Trulicity as well as metformin, for the past year.  He inquires about his history of fatty liver and risk for cirrhosis.   Patient has history of Barrett's esophagus and numerous adenomatous colon polyps and also has had duodenal adenomas.    He has had a large duodenal EMR for duodenal adenoma done at [facility] in 2019.  He had a follow-up EGD with me in July which showed no evidence of recurrence. Last abdominal ultrasound June 2021 showed multiple gallstones, no gallbladder wall thickening  and noted to have a fatty liver but no definite cirrhosis   Procedural history : EGD 03/14/2020 - Esophagogastric landmarks identified. - 1 cm hiatal hernia. - Z-line irregular as outlined above. Biopsied. - A few benign appearing gastric polyps. - Small superficial gastric ulcer with no stigmata of bleeding. - Nodular mucosa in the gastric antrum with erosive changes. Biopsied. - Normal stomach otherwise, biopsies taken to rule out H pylori - Mucosal changes in the duodenum as outlined - no high risk lesions appreciated. Biopsied.  Colonoscopy 03/20/2018 - cecal AVM, 21 polyps removed, fair prep, diverticulosis - 20 polyps adenoma, referred to genetic counselor  EGD 04/15/2018 - duodenal scar with small nodule at edge - biopsies done - no residual adenoma EGD 10/16/17 - Dr. Mont Dutton - piecemal resection of duodenal adenoma via EMR - path c/w adenoma EGD 10/07/2017 - [facility], stomach filled with food, procedure aborted EGD 09/16/2017 - polypectomy was planned but procedure aborted due to size of polyp and spasm, referred to [facility]  EGD 07/15/2017 - irregular z-line, multiple gastric polyps, duodenal polyp,  Colonoscopy 09/18/2012 - small polyps, diverticulosis, one TA - recall in 5 years  Colonoscopy 04/24/19 -The perianal and digital rectal examinations were normal. - A single large angiodysplastic lesion was found in the cecum. - A diminutive polyp was found in the cecum. The polyp was sessile. The polyp  was removed with a cold snare. Resection and retrieval were complete. - A 3 mm polyp was found in the hepatic flexure. The polyp was sessile. The polyp was removed with a cold snare. Resection and retrieval were complete. - Four sessile polyps were found in the transverse colon. The polyps were 3 to 4 mm in size. These polyps were removed with a cold snare. Resection and retrieval were complete. - Multiple small-mouthed diverticula were found in the left colon. - The colon was extremely  spastic which prolonged the procedure. - The exam was otherwise without abnormality.  Path c/w adenomas - repeat colonoscopy in 3 years   Fecal pancreatic elastase 31 Fecal lactoferrin negative   [place] 11/18/20 - IMPRESSION: 1. Cholelithiasis without sonographic evidence of acute cholecystitis. 2. Enlarged fatty liver may represent steatohepatitis. Clinical correlation is recommended.     Past Medical History:  Diagnosis Date  . Adenomatous duodenal polyp   . Arthritis   . Barrett's esophagus   . Benign enlargement of prostate   . Cataract    both eyes  . 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 Depression   . Diabetes ([place])   . Diverticulosis   . ED (erectile dysfunction)   . Fatty liver   . GERD (gastroesophageal reflux disease)   . HOH (hard of hearing)   . Hyperlipidemia   . Hypertension   . Hypothyroidism   . Internal hemorrhoids   . Leg edema, left   . Macular degeneration   . Neuromuscular disorder ([place])   . Osteoarthritis   . Peripheral neuropathy   . Sleep apnea    cpap  . Testosterone deficiency   . Tremors of nervous system   . Umbilical hernia      Past Surgical History:  Procedure Laterality Date  . BACK SURGERY  2009  . BACK SURGERY  09/2014   Dr Marykay Lex  . CATARACT EXTRACTION, BILATERAL  12/2016  . COLONOSCOPY    . ESOPHAGOGASTRODUODENOSCOPY (EGD) WITH PROPOFOL N/A 09/16/2017   Procedure: ESOPHAGOGASTRODUODENOSCOPY (EGD) WITH PROPOFOL;  Surgeon: Yetta Flock, MD;  Location: [facility];  Service: Gastroenterology;  Laterality: N/A;  . LUMBAR LAMINECTOMY/ DECOMPRESSION WITH MET-RX  2014  . TONSILLECTOMY  1956  . UMBILICAL HERNIA REPAIR     Family History  Problem Relation Age of Onset  . Atrial fibrillation Brother   . Cancer Mother 2       abdominal cancer, unk name of cancer type  . Other Father        viral encephalysis   . Heart attack Maternal Grandfather   . Heart attack Paternal  Grandfather   . Colon cancer Neg Hx   . Stomach cancer Neg Hx   . Colon polyps Neg Hx   . Esophageal cancer Neg Hx   . Rectal cancer Neg Hx    Social History   Tobacco Use  . Smoking status: Former Smoker    Quit date: 08/25/2002    Years since quitting: 18.3  . Smokeless tobacco: Never Used  . Tobacco comment: in college only 40 years ago  Vaping Use  . Vaping Use: Never used  Substance Use Topics  . Alcohol use: Yes    Alcohol/week: 14.0 - 28.0 standard drinks    Types: 14 - 28 Standard drinks or equivalent per week    Comment: varies  daily drinker  . Drug use: No   Current Outpatient Medications  Medication Sig Dispense Refill  .  ALPHA LIPOIC ACID PO Take 400 mg by mouth daily.    [name] amLODipine (NORVASC) 10 MG tablet Take 10 mg Daily by mouth.     [name] anastrozole (ARIMIDEX) 1 MG tablet Take 1 mg daily by mouth.    [name] ascorbic acid (VITAMIN C) 500 MG tablet Take 1,000 mg by mouth daily.    [name] aspirin EC 81 MG tablet Take 81 mg by mouth daily.    [name] atorvastatin (LIPITOR) 10 MG tablet Take 10 mg by mouth daily.     [name] buPROPion (WELLBUTRIN XL) 150 MG 24 hr tablet Take 150 mg by mouth every morning.    [name] buPROPion (WELLBUTRIN XL) 300 MG 24 hr tablet Take 300 mg by mouth Daily.     . calcium-vitamin D (OSCAL WITH D) 250-125 MG-UNIT tablet Take 1 tablet by mouth daily.    . chlorhexidine (PERIDEX) 0.12 % solution Use as directed 10 mLs in the mouth or throat 2 (two) times daily as needed.   0  . Cholecalciferol 25 MCG (1000 UT) tablet Take 1,000 Units by mouth daily.     . diazepam (VALIUM) 5 MG tablet Take 5-10 mg by mouth every 6 (six) hours as needed (for spasms).     . diclofenac (VOLTAREN) 75 MG EC tablet Take 75 mg by mouth 2 (two) times daily as needed (for pain.).     [name] Dulaglutide (TRULICITY) 1.5 SA/6.3KZ SOPN Inject into the skin.    . fluocinonide ointment (LIDEX) 6.01 % Apply 1 application topically 2 (two) times daily as needed (IRRITATION).    . fluticasone (FLONASE) 50  MCG/ACT nasal spray Place 1 spray into both nostrils daily as needed for allergies or rhinitis.    [name] JARDIANCE 10 MG TABS tablet Take 10 mg by mouth daily.    . Lancets (ONETOUCH ULTRASOFT) lancets 1 each by Other route daily.    . metFORMIN (GLUCOPHAGE-XR) 500 MG 24 hr tablet Take 1,000 mg by mouth 2 (two) times daily.    [name] MILK THISTLE PO Take 1 capsule by mouth daily.    . mupirocin ointment (BACTROBAN) 2 % Apply topically 2 (two) times daily. to affected area    . olmesartan (BENICAR) 20 MG tablet Take 20 mg by mouth daily.    . Omega-3 Fatty Acids (FISH OIL) 1200 MG CAPS Take 1 capsule by mouth daily.     [name] omeprazole (PRILOSEC) 20 MG capsule Take 1 capsule by mouth daily.    . Pancrelipase, Lip-Prot-Amyl, (ZENPEP) 3000-10000 units CPEP Take 2 capsules with each meal and 1 with every snack. 180 capsule 3  . sertraline (ZOLOFT) 100 MG tablet Take 200 mg by mouth daily.     . tamsulosin (FLOMAX) 0.4 MG CAPS capsule Take 0.4 mg by mouth daily.    [name] testosterone cypionate (DEPOTESTOTERONE CYPIONATE) 200 MG/ML injection Inject 200 mg into the muscle every 14 (fourteen) days.   1  . traMADol (ULTRAM) 50 MG tablet Take 50 mg by mouth every 6 (six) hours as needed (for pain.).     [name] TURMERIC PO Take 1,000 mg by mouth daily.     [name] zolpidem (AMBIEN) 10 MG tablet Take 10 mg by mouth daily as needed. For sleep     No current facility-administered medications for this visit.   No Known Allergies   Review of Systems: All systems reviewed and negative except where noted in HPI.   Lab Results  Component Value Date   WBC 4.3 11/10/2020   HGB 17.4 (  H) 11/10/2020   HCT 50.8 11/10/2020   MCV 99.5 11/10/2020   PLT 112.0 (L) 11/10/2020    Lab Results  Component Value Date   CREATININE 1.07 11/10/2020   BUN 17 11/10/2020   NA 137 11/10/2020   K 4.0 11/10/2020   CL 101 11/10/2020   CO2 28 11/10/2020    Lab Results  Component Value Date   ALT 66 (H) 11/10/2020   AST 47 (H) 11/10/2020    ALKPHOS 76 11/10/2020   BILITOT 1.1 11/10/2020     Physical Exam: BP (!) 142/88 (BP Location: Left Arm, Patient Position: Sitting, Cuff Size: Small)   Pulse (!) 102   Ht _0  (1.778 m)   Wt 253 lb (114.8 kg)   SpO2 99%   BMI 36.30 kg/m  Constitutional: Pleasant,well-developed,male in no acute distress. Neurological: Alert and oriented to person place and time. Psychiatric: Normal mood and affect. Behavior is normal.   ASSESSMENT AND PLAN: 70 year old male here for reassessment of the following:  Exocrine pancreatic insufficiency Chronic diarrhea Alcohol use Fatty liver disease  We discussed the patient's more recent history, symptoms certainly consistent with that of steatorrhea and his fecal elastase was extremely low.  He has found benefit with Zenpep given to him however appears to be quite underdosed.  He weighs over 250 pounds.  We will redosed him with Zenpep 40,000 unit capsules, 2 capsules with meals and 1 with snack and we will see how he does.  He is rather anxious about his diabetes and now his pancreatic exocrine insufficiency, and risk for pancreatic cancer.  He quires about pancreatic imaging.  I offered him an MRCP for peace of mind to ensure okay given his last cross-sectional imaging was a few years ago.  We will await his course with higher doses and Pepcid.  If his symptoms persist of GI upset and loose stools, could also consider component of this related to Trulicity but will hold off on making any adjustments to that for now.  We did discuss his ongoing alcohol use, risks for alcoholic liver disease and chronic pancreatitis.  I recommend given his medical problems to date he stop all alcohol, high risk for cirrhosis etc.  He is working on this and seems motivated to stay off alcohol.  All questions answered he agreed with the plan, would like to see him back in 6 months or so or sooner with questions.  Plan: - change dose of Zenpep to 40000 K capsules - 2 with  meals and one with snack - MRCP  - alcohol abstinence - recall EGD and colonoscopy in our system for follow up of duodenal adenomas / colonic adenomas  [place] Cellar, MD [facility]

## 2021-01-05 NOTE — Patient Instructions (Addendum)
If you are age 72 or older, your body mass index should be between 23-30. Your Body mass index is 36.3 kg/m. If this is out of the aforementioned range listed, please consider follow up with your Primary Care Provider.  If you are age 14 or younger, your body mass index should be between 19-25. Your Body mass index is 36.3 kg/m. If this is out of the aformentioned range listed, please consider follow up with your Primary Care Provider.   We have sent the following medications to your pharmacy for you to pick up at your convenience: Zenpep 40,000 units: Take 2 capsules with meals and 1 capsule with snacks  You will be contacted by [place] in the next 2 days to arrange a MRCP.  The number on your caller ID will be [phone], please answer when they call.  If you have not heard from them in 2 days please call [phone] to schedule.    Thank you for entrusting me with your care and for choosing [facility], Dr. [place] Cellar

## 2021-01-06 ENCOUNTER — Telehealth: Payer: Self-pay | Admitting: Gastroenterology

## 2021-01-06 NOTE — Telephone Encounter (Signed)
Please advise Dr Havery Moros. Per talking to [place] there isn't an assistance plan like there is with Creon. Would you like to try Creon so we can apply for assistance?

## 2021-01-06 NOTE — Telephone Encounter (Signed)
Pls call pt. He stated that his insurance is not covering Zenpet and would like some help with that.

## 2021-01-06 NOTE — Telephone Encounter (Signed)
Okay thanks for letting me know. Yes let's try Creon, same dosing, and see if that may be better covered. Thanks

## 2021-01-09 ENCOUNTER — Other Ambulatory Visit: Payer: Self-pay

## 2021-01-09 MED ORDER — PANCRELIPASE (LIP-PROT-AMYL) 36000-114000 UNITS PO CPEP: ORAL_CAPSULE | ORAL | 11 refills | Status: DC

## 2021-01-09 NOTE — Telephone Encounter (Signed)
Inbound call from patient. Questions about the Creon prescription. Best contact number [phone]

## 2021-01-09 NOTE — Telephone Encounter (Signed)
New Rx for Creon dose of 36,000 units, 2 with meals and one with snacks. Left a detailed message for the patient.

## 2021-01-10 NOTE — Telephone Encounter (Signed)
Called and left message for patient with details to take 2 capsules with meals and one capsule with snacks.  Indicated he should call back if he has any other questions.

## 2021-01-11 ENCOUNTER — Other Ambulatory Visit: Payer: Self-pay

## 2021-01-11 ENCOUNTER — Other Ambulatory Visit: Payer: Self-pay | Admitting: Gastroenterology

## 2021-01-11 ENCOUNTER — Ambulatory Visit (HOSPITAL_COMMUNITY)
Admission: RE | Admit: 2021-01-11 | Discharge: 2021-01-11 | Disposition: A | Discharge status: Home / Self Care | Payer: Medicare Other | Source: Ambulatory Visit | Attending: Gastroenterology | Admitting: Gastroenterology

## 2021-01-11 DIAGNOSIS — K529 Noninfective gastroenteritis and colitis, unspecified: Secondary | ICD-10-CM

## 2021-01-11 DIAGNOSIS — K8681 Exocrine pancreatic insufficiency: Secondary | ICD-10-CM

## 2021-01-11 DIAGNOSIS — F1090 Alcohol use, unspecified, uncomplicated: Secondary | ICD-10-CM

## 2021-01-11 DIAGNOSIS — Z789 Other specified health status: Secondary | ICD-10-CM

## 2021-01-11 DIAGNOSIS — Z7289 Other problems related to lifestyle: Secondary | ICD-10-CM

## 2021-01-11 MED ORDER — GADOBUTROL 1 MMOL/ML IV SOLN
10.0000 mL | Freq: Once | INTRAVENOUS | Status: AC | PRN
  Administered 2021-01-11: 10 mL via INTRAVENOUS

## 2021-01-13 ENCOUNTER — Telehealth: Payer: Self-pay

## 2021-01-13 NOTE — Telephone Encounter (Signed)
Patient called and wanted to voice his concern that he was originally significantly under dosed on Zenpep in 10-2020. He spent a lot of money that turned out to be "worthless" until he saw Dr. Havery Moros in May and his dose was increased significantly. He indicates he is upset at all the money he wasted on the lower dose which did nothing to help him.  He also asked if there was anything that could be done to help with the current cost of Creon which costs him $145 for a 30 day supply.  A Creon Nurse Navigator has been in touch with the patient and has suggested he see if he would qualify for Patient Assistant thru [employer]. He indicates he does not know if he would meet the income requirements. I let him know that the Nurse Navigator would be most knowledgeable about options to help with the cost. He indicates he will work with her and pursue that option to see if he qualifies.

## 2021-01-13 NOTE — Telephone Encounter (Signed)
Patient called and requested a call back regarding his MRI results and what they mean.  I called back and left a message that Dr. Havery Moros sent him a MyChart message this afternoon explaining the findings. I read him the message in the voicemail, all which was reassuring and not cause for concern. I asked that he call us back with any questions.

## 2021-01-16 NOTE — Telephone Encounter (Signed)
Sorry to hear this. Thanks for letting me know. Whichever is cheaper - Zenpep or Creon, based on patient assistance, does not make a difference to me which one he takes. Hopefully he is covered with Creon assistance or can try looking into alternative pharmacies. I was not aware of his prior dose until I saw him in the office, hopefully it makes him feel better

## 2021-05-28 ENCOUNTER — Emergency Department (HOSPITAL_COMMUNITY)
Admission: EM | Admit: 2021-05-28 | Discharge: 2021-05-29 | Disposition: A | Discharge status: Home / Self Care | Payer: Medicare Other | Attending: Emergency Medicine | Admitting: Emergency Medicine

## 2021-05-28 ENCOUNTER — Encounter (HOSPITAL_COMMUNITY): Payer: Self-pay | Admitting: Emergency Medicine

## 2021-05-28 DIAGNOSIS — Z7984 Long term (current) use of oral hypoglycemic drugs: Secondary | ICD-10-CM | POA: Insufficient documentation

## 2021-05-28 DIAGNOSIS — K469 Unspecified abdominal hernia without obstruction or gangrene: Secondary | ICD-10-CM | POA: Insufficient documentation

## 2021-05-28 DIAGNOSIS — Y92010 Kitchen of single-family (private) house as the place of occurrence of the external cause: Secondary | ICD-10-CM | POA: Diagnosis not present

## 2021-05-28 DIAGNOSIS — Z79899 Other long term (current) drug therapy: Secondary | ICD-10-CM | POA: Insufficient documentation

## 2021-05-28 DIAGNOSIS — E1140 Type 2 diabetes mellitus with diabetic neuropathy, unspecified: Secondary | ICD-10-CM | POA: Diagnosis not present

## 2021-05-28 DIAGNOSIS — Z87891 Personal history of nicotine dependence: Secondary | ICD-10-CM | POA: Diagnosis not present

## 2021-05-28 DIAGNOSIS — M62830 Muscle spasm of back: Secondary | ICD-10-CM | POA: Diagnosis not present

## 2021-05-28 DIAGNOSIS — W010XXA Fall on same level from slipping, tripping and stumbling without subsequent striking against object, initial encounter: Secondary | ICD-10-CM | POA: Diagnosis not present

## 2021-05-28 DIAGNOSIS — E039 Hypothyroidism, unspecified: Secondary | ICD-10-CM | POA: Diagnosis not present

## 2021-05-28 DIAGNOSIS — Z7982 Long term (current) use of aspirin: Secondary | ICD-10-CM | POA: Diagnosis not present

## 2021-05-28 DIAGNOSIS — R1084 Generalized abdominal pain: Secondary | ICD-10-CM

## 2021-05-28 DIAGNOSIS — W19XXXA Unspecified fall, initial encounter: Secondary | ICD-10-CM

## 2021-05-28 DIAGNOSIS — I10 Essential (primary) hypertension: Secondary | ICD-10-CM | POA: Diagnosis not present

## 2021-05-28 DIAGNOSIS — M62838 Other muscle spasm: Secondary | ICD-10-CM

## 2021-05-28 DIAGNOSIS — R109 Unspecified abdominal pain: Secondary | ICD-10-CM | POA: Diagnosis present

## 2021-05-28 NOTE — ED Triage Notes (Signed)
Patient reports trip and fall with head injury and syncopal episode in kitchen yesterday. C/o pain in back and abdomen since fall. Denies taking blood thinner. Ambulatory with two canes.

## 2021-05-29 ENCOUNTER — Emergency Department (HOSPITAL_COMMUNITY): Payer: Medicare Other

## 2021-05-29 DIAGNOSIS — K469 Unspecified abdominal hernia without obstruction or gangrene: Secondary | ICD-10-CM | POA: Diagnosis not present

## 2021-05-29 LAB — CBC WITH DIFFERENTIAL/PLATELET
Abs Immature Granulocytes: 0.03 10*3/uL (ref 0.00–0.07)
Basophils Absolute: 0 10*3/uL (ref 0.0–0.1)
Basophils Relative: 0 %
Eosinophils Absolute: 0 10*3/uL (ref 0.0–0.5)
Eosinophils Relative: 0 %
HCT: 47 % (ref 39.0–52.0)
Hemoglobin: 15.7 g/dL (ref 13.0–17.0)
Immature Granulocytes: 0 %
Lymphocytes Relative: 19 %
Lymphs Abs: 1.4 10*3/uL (ref 0.7–4.0)
MCH: 34.3 pg — ABNORMAL HIGH (ref 26.0–34.0)
MCHC: 33.4 g/dL (ref 30.0–36.0)
MCV: 102.6 fL — ABNORMAL HIGH (ref 80.0–100.0)
Monocytes Absolute: 1.2 10*3/uL — ABNORMAL HIGH (ref 0.1–1.0)
Monocytes Relative: 16 %
Neutro Abs: 4.8 10*3/uL (ref 1.7–7.7)
Neutrophils Relative %: 65 %
Platelets: 125 10*3/uL — ABNORMAL LOW (ref 150–400)
RBC: 4.58 MIL/uL (ref 4.22–5.81)
RDW: 13.4 % (ref 11.5–15.5)
WBC: 7.5 10*3/uL (ref 4.0–10.5)
nRBC: 0 % (ref 0.0–0.2)

## 2021-05-29 LAB — COMPREHENSIVE METABOLIC PANEL
ALT: 41 U/L (ref 0–44)
AST: 26 U/L (ref 15–41)
Albumin: 4.4 g/dL (ref 3.5–5.0)
Alkaline Phosphatase: 55 U/L (ref 38–126)
Anion gap: 15 (ref 5–15)
BUN: 21 mg/dL (ref 8–23)
CO2: 23 mmol/L (ref 22–32)
Calcium: 9.4 mg/dL (ref 8.9–10.3)
Chloride: 104 mmol/L (ref 98–111)
Creatinine, Ser: 0.98 mg/dL (ref 0.61–1.24)
GFR, Estimated: >60 mL/min (ref >60)
Glucose, Bld: 139 mg/dL — ABNORMAL HIGH (ref 70–99)
Potassium: 3.8 mmol/L (ref 3.5–5.1)
Sodium: 142 mmol/L (ref 135–145)
Total Bilirubin: 1.7 mg/dL — ABNORMAL HIGH (ref 0.3–1.2)
Total Protein: 7.1 g/dL (ref 6.5–8.1)

## 2021-05-29 LAB — MAGNESIUM: Magnesium: 2.3 mg/dL (ref 1.7–2.4)

## 2021-05-29 MED ORDER — KETOROLAC TROMETHAMINE 15 MG/ML IJ SOLN
15.0000 mg | Freq: Once | INTRAMUSCULAR | Status: AC
  Administered 2021-05-29: 15 mg via INTRAVENOUS
  Filled 2021-05-29: qty 1

## 2021-05-29 MED ORDER — HYDROMORPHONE HCL 1 MG/ML IJ SOLN
1.0000 mg | Freq: Once | INTRAMUSCULAR | Status: AC
  Administered 2021-05-29: 1 mg via INTRAVENOUS
  Filled 2021-05-29: qty 1

## 2021-05-29 MED ORDER — TIZANIDINE HCL 2 MG PO TABS: 2.0000 mg | ORAL_TABLET | Freq: Three times a day (TID) | ORAL | 0 refills | Status: DC | PRN

## 2021-05-29 MED ORDER — DIAZEPAM 5 MG PO TABS
5.0000 mg | ORAL_TABLET | Freq: Once | ORAL | Status: AC
  Administered 2021-05-29: 5 mg via ORAL
  Filled 2021-05-29: qty 1

## 2021-05-29 NOTE — ED Provider Notes (Signed)
[facility] DEPT Provider Note   CSN: [id number] Arrival date & time: 05/28/21  1644     History Chief Complaint  Patient presents with   Robert Castro is a 70 y.o. male.  HPI 70 year old male presents after a fall and hitting his head and injuring his back.  Patient states that he fell yesterday morning (10/1) after his legs gave out and he slipped.  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  He has a spine doctor in [place] that he called because he hit his head and lost consciousness after hitting his head and then has been having back pain and spasms.  He is requiring a cane to walk due to the pain.  He states that the neurosurgeon told him to come to the ER for imaging including his head.  He had a headache and some nausea earlier but this is gone now.  He denies any new weakness or numbness in his extremities but his legs are chronically weak and have neuropathy.  He chronically has a little incontinence from prostate issues but no new incontinence.  He is having severe spasms from his back through his abdomen that come and go.  They tight his entire abdomen and back.  No neck pain or injury. Pain is rated as severe. No blood thinner use.  He did not feel lightheaded or dizzy prior to the fall.  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    stenosis and arthritis   Depression    Diabetes ([place])    Diverticulosis    ED (erectile dysfunction)    Fatty liver    GERD (gastroesophageal reflux disease)    HOH (hard of hearing)    Hyperlipidemia    Hypertension    Hypothyroidism    Internal hemorrhoids    Leg edema, left    Macular degeneration    Neuromuscular disorder (HCC)    Osteoarthritis    Peripheral neuropathy    Sleep  apnea    cpap   Testosterone deficiency    Tremors of nervous system    Umbilical hernia     Patient Active Problem List   Diagnosis Date Noted   Genetic testing 05/01/2018   Adenomatous duodenal polyp    Diarrhea 05/05/2015   GERD 12/01/2007   DYSPHAGIA UNSPECIFIED 12/01/2007   INTERNAL HEMORRHOIDS 03/06/2007   BARRETTS ESOPHAGUS 01/29/2007   DIVERTICULOSIS, COLON 08/01/2005   COLONIC POLYPS, ADENOMATOUS 07/09/2002    Past Surgical History:  Procedure Laterality Date   BACK SURGERY  2009   BACK SURGERY  09/2014   Dr Marykay Lex   CATARACT EXTRACTION, BILATERAL  12/2016   COLONOSCOPY     ESOPHAGOGASTRODUODENOSCOPY (EGD) WITH PROPOFOL N/A 09/16/2017   Procedure: ESOPHAGOGASTRODUODENOSCOPY (EGD) WITH PROPOFOL;  Surgeon: Yetta Flock, MD;  Location: [facility] ENDOSCOPY;  Service: Gastroenterology;  Laterality: N/A;   LUMBAR LAMINECTOMY/ DECOMPRESSION WITH MET-RX  2014   TONSILLECTOMY  9470   UMBILICAL HERNIA REPAIR         Family History  Problem Relation Age of Onset   Atrial fibrillation Brother    Cancer Mother 16       abdominal cancer, unk name of cancer type   Other Father  viral encephalysis    Heart attack Maternal Grandfather    Heart attack Paternal Grandfather    Colon cancer Neg Hx    Stomach cancer Neg Hx    Colon polyps Neg Hx    Esophageal cancer Neg Hx    Rectal cancer Neg Hx     Social History   Tobacco Use   Smoking status: Former    Types: Cigarettes    Quit date: 08/25/2002    Years since quitting: 18.7   Smokeless tobacco: Never   Tobacco comments:    in college only 40 years ago  Vaping Use   Vaping Use: Never used  Substance Use Topics   Alcohol use: Yes    Alcohol/week: 14.0 - 28.0 standard drinks    Types: 14 - 28 Standard drinks or equivalent per week    Comment: varies  daily drinker   Drug use: No    Home Medications Prior to Admission medications   Medication Sig Start Date End Date Taking? Authorizing Provider   tiZANidine (ZANAFLEX) 2 MG tablet Take 1 tablet (2 mg total) by mouth every 8 (eight) hours as needed for muscle spasms. 05/29/21  Yes Sherwood Gambler, MD  ALPHA LIPOIC ACID PO Take 400 mg by mouth daily.    [provider]  amLODipine (NORVASC) 10 MG tablet Take 10 mg Daily by mouth.  02/14/11   [provider]  anastrozole (ARIMIDEX) 1 MG tablet Take 1 mg daily by mouth.    [provider]  ascorbic acid (VITAMIN C) 500 MG tablet Take 1,000 mg by mouth daily.    [provider]  aspirin EC 81 MG tablet Take 81 mg by mouth daily.    [provider]  atorvastatin (LIPITOR) 10 MG tablet Take 10 mg by mouth daily.  09/06/16   [provider]  buPROPion (WELLBUTRIN XL) 150 MG 24 hr tablet Take 150 mg by mouth every morning. 03/29/20   [provider]  buPROPion (WELLBUTRIN XL) 300 MG 24 hr tablet Take 300 mg by mouth Daily.  03/05/11   [provider]  calcium-vitamin D (OSCAL WITH D) 250-125 MG-UNIT tablet Take 1 tablet by mouth daily.    [provider]  chlorhexidine (PERIDEX) 0.12 % solution Use as directed 10 mLs in the mouth or throat 2 (two) times daily as needed.  06/24/14   [provider]  Cholecalciferol 25 MCG (1000 UT) tablet Take 1,000 Units by mouth daily.     [provider]  diazepam (VALIUM) 5 MG tablet Take 5-10 mg by mouth every 6 (six) hours as needed (for spasms).  11/30/10   [provider]  diclofenac (VOLTAREN) 75 MG EC tablet Take 75 mg by mouth 2 (two) times daily as needed (for pain.).  08/12/12   [provider]  Dulaglutide (TRULICITY) 1.5 LZ/7.6BH SOPN Inject into the skin.    [provider]  fluocinonide ointment (LIDEX) 4.19 % Apply 1 application topically 2 (two) times daily as needed (IRRITATION).    [provider]  fluticasone (FLONASE) 50 MCG/ACT nasal spray Place 1 spray into both nostrils daily as needed for allergies or rhinitis.     [provider]  JARDIANCE 10 MG TABS tablet Take 10 mg by mouth daily. 07/30/19   [provider]  Lancets (ONETOUCH ULTRASOFT) lancets 1 each by Other route daily. 08/07/16   [provider]  lipase/protease/amylase (CREON) 36000 UNITS CPEP capsule Take 2 capsules (72,000 Units total) by mouth 3 (  three) times daily with meals. May also take 1 capsule (36,000 Units total) as needed (with snacks). 01/09/21   Armbruster, Carlota Raspberry, MD  metFORMIN (GLUCOPHAGE-XR) 500 MG 24 hr tablet Take 1,000 mg by mouth 2 (two) times daily.    [provider]  MILK THISTLE PO Take 1 capsule by mouth daily.    [provider]  mupirocin ointment (BACTROBAN) 2 % Apply topically 2 (two) times daily. to affected area 12/18/19   [provider]  olmesartan (BENICAR) 20 MG tablet Take 20 mg by mouth daily.    [provider]  Omega-3 Fatty Acids (FISH OIL) 1200 MG CAPS Take 1 capsule by mouth daily.     [provider]  omeprazole (PRILOSEC) 20 MG capsule Take 1 capsule by mouth daily. 03/24/20   [provider]  sertraline (ZOLOFT) 100 MG tablet Take 200 mg by mouth daily.     [provider]  tamsulosin (FLOMAX) 0.4 MG CAPS capsule Take 0.4 mg by mouth daily. 11/05/20   [provider]  testosterone cypionate (DEPOTESTOTERONE CYPIONATE) 200 MG/ML injection Inject 200 mg into the muscle every 14 (fourteen) days.  06/02/14   [provider]  traMADol (ULTRAM) 50 MG tablet Take 50 mg by mouth every 6 (six) hours as needed (for pain.).  03/16/14   [provider]  TURMERIC PO Take 1,000 mg by mouth daily.     [provider]  zolpidem (AMBIEN) 10 MG tablet Take 10 mg by mouth daily as needed. For sleep 03/25/13   [provider]    Allergies    Patient has no known allergies.  Review of Systems   Review of Systems  Respiratory:  Negative for shortness of breath.   Cardiovascular:  Positive for  chest pain (bilateral ribs).  Gastrointestinal:  Positive for abdominal pain and nausea. Negative for vomiting.  Musculoskeletal:  Positive for back pain and myalgias.  Neurological:  Positive for headaches. Negative for syncope and light-headedness.  All other systems reviewed and are negative.  Physical Exam Updated Vital Signs BP (!) 167/102   Pulse 89   Temp 98.1 F (36.7 C) (Oral)   Resp 14   Ht _0  (1.778 m)   Wt 115.7 kg   SpO2 96%   BMI 36.59 kg/m   Physical Exam Vitals and nursing note reviewed.  Constitutional:      General: He is in acute distress (in pain).     Appearance: He is well-developed.  HENT:     Head: Normocephalic and atraumatic.     Right Ear: External ear normal.     Left Ear: External ear normal.     Nose: Nose normal.  Eyes:     General:        Right eye: No discharge.        Left eye: No discharge.     Extraocular Movements: Extraocular movements intact.     Pupils: Pupils are equal, round, and reactive to light.  Cardiovascular:     Rate and Rhythm: Normal rate and regular rhythm.     Heart sounds: Normal heart sounds.  Pulmonary:     Effort: Pulmonary effort is normal.     Breath sounds: Normal breath sounds.  Abdominal:     Palpations: Abdomen is soft.     Tenderness: There is no abdominal tenderness.     Hernia: A hernia is present.     Comments: In between spasms, the patient has no reproducible abdominal tenderness.  He has a large abdominal wall hernia.  His entire abdomen becomes tight whenever he does experience the spasms and he is diffusely tender.  Musculoskeletal:     Cervical back: Neck supple. No bony tenderness. No spinous process tenderness or muscular tenderness.     Thoracic back: No bony tenderness.     Lumbar back: No bony tenderness.  Skin:    General: Skin is warm and dry.  Neurological:     Mental Status: He is alert.     Comments: CN 3-12 grossly intact. 5/5 strength in both upper extremities.  Difficult to  assess strength in both lower extremities as this induces pain in his back and abdomen.  However he is fairly freely moving both extremities symmetrically.  Grossly normal sensation. Normal finger to nose.   Psychiatric:        Mood and Affect: Mood is not anxious.    ED Results / Procedures / Treatments   Labs (all labs ordered are listed, but only abnormal results are displayed) Labs Reviewed  CBC WITH DIFFERENTIAL/PLATELET - Abnormal; Notable for the following components:      Result Value   MCV 102.6 (*)    MCH 34.3 (*)    Platelets 125 (*)    Monocytes Absolute 1.2 (*)    All other components within normal limits  COMPREHENSIVE METABOLIC PANEL - Abnormal; Notable for the following components:   Glucose, Bld 139 (*)    Total Bilirubin 1.7 (*)    All other components within normal limits  MAGNESIUM    EKG EKG Interpretation  Date/Time:  Monday May 29 2021 01:05:09 EDT Ventricular Rate:  91 PR Interval:  215 QRS Duration: 111 QT Interval:  364 QTC Calculation: 448 R Axis:   -60 Text Interpretation: Sinus rhythm Prolonged PR interval Left anterior fascicular block Abnormal R-wave progression, late transition similar to 2009 Confirmed by Sherwood Gambler [phone]) on 05/29/2021 1:25:44 AM  Radiology DG Ribs Bilateral W/Chest  Result Date: 05/29/2021 CLINICAL DATA:  Status post fall. EXAM: BILATERAL RIBS AND CHEST - 4+ VIEW COMPARISON:  None. FINDINGS: No fracture or other bone lesions are seen involving the ribs. Postoperative changes are seen within the lower thoracic spine and lumbar spine. There is no evidence of pneumothorax or pleural effusion. Both lungs are clear. Heart size and mediastinal contours are within normal limits. IMPRESSION: No acute osseous abnormality. Electronically Signed   By: Virgina Norfolk M.D.   On: 05/29/2021 01:08   DG Thoracic Spine W/Swimmers  Result Date: 05/29/2021 CLINICAL DATA:  Status post fall. EXAM: THORACIC SPINE - 3 VIEWS  COMPARISON:  None. FINDINGS: There is no evidence of an acute thoracic spine fracture. Alignment is normal. Marked severity endplate sclerosis and anterior osteophyte formation is seen within the mid to lower thoracic spine. Bilateral pedicle screws are seen at the levels of T11 and T12. IMPRESSION: 1. No acute findings. 2. Marked severity degenerative changes with postoperative changes at T11 and T12. Electronically Signed   By: Virgina Norfolk M.D.   On: 05/29/2021 01:03   DG Lumbar Spine Complete  Result Date: 05/29/2021 CLINICAL DATA:  Status post fall. EXAM: LUMBAR SPINE - COMPLETE 4+ VIEW COMPARISON:  Jan 14, 2013 FINDINGS: There is no evidence of an acute lumbar spine fracture. There is mild dextroscoliosis. Multiple bilateral pedicle screws are seen within the lower thoracic spine, throughout the lumbar spine and within the superior aspect of the pelvis. IMPRESSION: 1. No acute osseous abnormality. 2. Extensive postoperative changes throughout the  spine and pelvis. Electronically Signed   By: Virgina Norfolk M.D.   On: 05/29/2021 01:05    Procedures Procedures   Medications Ordered in ED Medications  HYDROmorphone (DILAUDID) injection 1 mg (1 mg Intravenous Given 05/29/21 0100)  diazepam (VALIUM) tablet 5 mg (5 mg Oral Given 05/29/21 0100)  ketorolac (TORADOL) 15 MG/ML injection 15 mg (15 mg Intravenous Given 05/29/21 0100)  HYDROmorphone (DILAUDID) injection 1 mg (1 mg Intravenous Given 05/29/21 0330)    ED Course  I have reviewed the triage vital signs and the nursing notes.  Pertinent labs & imaging results that were available during my care of the patient were reviewed by me and considered in my medical decision making (see chart for details).    MDM Rules/Calculators/A&P                           Patient's x-ray imaging is unremarkable.  Labs are unremarkable.  He is feeling somewhat better with some Dilaudid, Toradol and p.o. Valium.  Pain seem to be doing better but then  started to recur, especially with diffuse abdominal discomfort and spasm.  While I think this is probably muscular in nature, given continued pain requiring another dose of Dilaudid I discussed getting a CT of his abdomen.  Previously we discussed getting CT head and I think it is low likelihood that he has a serious head injury given no further headache, nausea, vomiting, or dizziness.  However after originally agreeing to head CT because his specialist had wanted it, he later declined it.  I do not think this is unreasonable as I have a lower suspicion of head injury.  However while waiting on the CT, the patient came walking out of the room with his canes and indicated he wants to leave.  He states he can no longer stay here as he has been here too long.  He does seem to understand the risks of leaving early without proper work-up including potential death.  He will go home with friend at the bedside.  We will give a muscle relaxer prescription.  He was advised that he could return at any time. Final Clinical Impression(s) / ED Diagnoses Final diagnoses:  Fall  Generalized abdominal pain  Muscle spasm    Rx / DC Orders ED Discharge Orders          Ordered    tiZANidine (ZANAFLEX) 2 MG tablet  Every 8 hours PRN        05/29/21 0330             Sherwood Gambler, MD 05/29/21 [phone]

## 2021-05-29 NOTE — ED Notes (Signed)
Patient transported to CT 

## 2021-05-29 NOTE — ED Notes (Signed)
Patient has no concerns after AVS has been reviewed and patient education provided. Patient discharged. Patient refused to go to CT and wanted to be discharged. MD made it clear to patient he should get a CT to be safe but patient refused.

## 2021-05-29 NOTE — Discharge Instructions (Addendum)
You are leaving prior to your full work-up being completed.  It was recommended to get a CT scan of the abdomen and pelvis.  By leaving now, there is some concern that we could be missing an intra-abdominal emergency.  This could result in short and long-term consequences and even death.  If at any point you change your mind and want to come back or you develop new or worsening symptoms he should come back to the emergency department or call 911.  If you develop worsening, continued, or recurrent abdominal pain, uncontrolled vomiting, fever, chest or back pain, weakness or numbness in your legs, incontinence or any other new/concerning symptoms then return to the ER for evaluation.

## 2021-06-23 ENCOUNTER — Telehealth: Payer: Self-pay | Admitting: Gastroenterology

## 2021-06-23 NOTE — Telephone Encounter (Signed)
Returned call to patient. His phone went straight to vm, lm on vm for patient to return call.

## 2021-06-23 NOTE — Telephone Encounter (Signed)
Inbound call from patient stating he fell about a month ago and started to have pain since then.  Wants to speak with a nurse to discuss whether he should setup an appt with GI.  Please advise.

## 2021-06-26 NOTE — Telephone Encounter (Signed)
Returned call to patient. His phone goes straight to vm again. Lm on vm for patient to return call.

## 2021-06-27 NOTE — Telephone Encounter (Signed)
Left message for pt to call back  °

## 2021-06-29 NOTE — Telephone Encounter (Signed)
No return call received. Will await further communication from patient.  

## 2021-09-12 ENCOUNTER — Other Ambulatory Visit: Payer: Self-pay | Admitting: Physician Assistant

## 2021-09-12 DIAGNOSIS — M546 Pain in thoracic spine: Secondary | ICD-10-CM

## 2021-09-12 DIAGNOSIS — M545 Low back pain, unspecified: Secondary | ICD-10-CM

## 2021-09-12 DIAGNOSIS — M4014 Other secondary kyphosis, thoracic region: Secondary | ICD-10-CM

## 2021-09-12 DIAGNOSIS — M4327 Fusion of spine, lumbosacral region: Secondary | ICD-10-CM

## 2021-09-13 ENCOUNTER — Other Ambulatory Visit: Payer: Self-pay | Admitting: Physician Assistant

## 2021-09-13 DIAGNOSIS — M4327 Fusion of spine, lumbosacral region: Secondary | ICD-10-CM

## 2021-09-13 DIAGNOSIS — M546 Pain in thoracic spine: Secondary | ICD-10-CM

## 2021-09-13 DIAGNOSIS — M4014 Other secondary kyphosis, thoracic region: Secondary | ICD-10-CM

## 2021-09-13 DIAGNOSIS — M545 Low back pain, unspecified: Secondary | ICD-10-CM

## 2021-09-15 ENCOUNTER — Ambulatory Visit (INDEPENDENT_AMBULATORY_CARE_PROVIDER_SITE_OTHER): Payer: Medicare Other | Admitting: Nurse Practitioner

## 2021-09-15 ENCOUNTER — Encounter: Payer: Self-pay | Admitting: Nurse Practitioner

## 2021-09-15 VITALS — BP 128/74 | HR 96 | Ht 70.0 in | Wt 250.2 lb

## 2021-09-15 DIAGNOSIS — K8681 Exocrine pancreatic insufficiency: Secondary | ICD-10-CM | POA: Diagnosis not present

## 2021-09-15 NOTE — Progress Notes (Signed)
ASSESSMENT AND PLAN    # 71 yo male with EPI. Zenpep was cost prohibitive.  Taking Creon 36K, he increased dose to 3 with meals and 2 with snacks. Urgency improved and having only 1-2 BMs a day but his stools are stool unformed and oily which bothers him.  --He inquires if there is need for further testing. I do not think there is at this point. He asks about SIBO which is a good thought but this still seems more related to EPI / steatorrhea.  --Doubt metformin is playing a part since he is not having true diarrhea.   --He does not limit fat intake and is still drinking several mixed drinks a night,  both of which can worsen symptoms.  --We talked about limiting amount / type of fat intake. Advised not to omit fat as that could lead to malabsorption of certain vitamins. Will locate some dietary information and get it to him next week. He may benefit from a consult with a dietician at some point ( EPI, DM, obesity). --at this point his weight loss seems proportionate to efforts to lose weight. However he is at risk for malabsorption of fat soluble vitamins.  --He will follow up in clinic in March with Dr. Havery Moros.   Addendum: located dietary information as below. Will get him this information next week after I talk with Dr. Havery Moros about  his pancreatic enzymes. Patient increases about a further increase in dose and he already taking more than recommended amount for his weight.  In general should avoid highly processed foods , foods  high in trans fat, hydrogenated oils, and saturated fat. Instead look for foods that contain monounsaturated fat, polyunsaturated fat, omega-3 fatty acids. Olive oil, peanut oil, nuts, seeds, and fish, such as salmon and tuna, all contain healthy fats.  # Chronic thrombocytopenia, no reported cirrhosis on MRCP.  --Reiterated need for Etoh cessation.     HISTORY OF PRESENT ILLNESS    Chief Complaint : oily stools  Robert Castro is a 71 y.o. male known  to Dr. 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  Additional medical history as listed in [place] .   Patient has extensive GI history. Most recently being followed for EPI   March 2022 - office visit with  Nicoletta Ba, PA. Described fecal urgency and diarrhea.  Stools were oily and described as floating.  Diagnosed with pancreatic insufficiency, fecal elastase low at 31.  Started on pancreatic enzymes.   May 2022 - office with Dr. Havery Moros. Pancreatic enzymes felt to be underdosed. Changed to Zenpep 40,000 units 2 capsules with meals and 1 with snacks.  Alcohol use was discussed in regards to risk for alcoholic liver disease and chronic pancreatitis.  Recommendations to stop alcohol were made.  Patient was concerned about his risk for pancreatic cancer given EPI and diabetes so MRCP was ordered.   His MRCP showed a normal pancreas, hepatic steatosis, cholelithiasis without evidence for cholecystitis or choledocholithiasis.  Indeterminate lesion in the spleen likely benign.   INTERVAL HISTORY:  For the most part he is having only 1-2 bowel movements a day.  His stools are round, unformed and oily.  Some days his stools are more normal. He occasionally has  diarrhea but this is definitely not his norm.  His weight is down about 10 pounds but he is actively trying to lose weight.  He does not feel his weight loss is out of proportion to efforts to lose weight.  He does not watch his fat intake.  He is still drinking several mixed drinks a night.    PREVIOUS ENDOSCOPIC EVALUATIONS / PERTINENT STUDIES:   Procedural history : EGD 03/14/2020 - Esophagogastric landmarks identified. - 1 cm hiatal hernia. - Z-line irregular as  outlined above. Biopsied. - A few benign appearing gastric polyps. - Small superficial gastric ulcer with no stigmata of bleeding. - Nodular mucosa in the gastric antrum with erosive changes. Biopsied. - Normal stomach otherwise, biopsies taken to rule out H pylori - Mucosal changes in the duodenum as outlined - no high risk lesions appreciated. Biopsied.   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 Mont Dutton - piecemal resection of duodenal adenoma via EMR - path c/w adenoma EGD 10/07/2017 - [facility], stomach filled with food, procedure aborted EGD 09/16/2017 - polypectomy was planned but procedure aborted due to size of polyp and spasm, referred to [facility]   EGD 07/15/2017 - irregular z-line, multiple gastric polyps, duodenal polyp,  Colonoscopy 09/18/2012 - small polyps, diverticulosis, one TA - recall in 5 years   Colonoscopy 04/24/19 - The perianal and digital rectal examinations were normal. - A single large angiodysplastic lesion was found in the cecum. - A diminutive polyp was found in the cecum. The polyp was sessile. The polyp was removed with a cold snare. Resection and retrieval were complete. - A 3 mm polyp was found in the hepatic flexure. The polyp was sessile. The polyp was removed with a cold snare. Resection and retrieval were complete. - Four sessile polyps were found in the transverse colon. The polyps were 3 to 4 mm in size. These polyps were removed with a cold snare. Resection and retrieval were complete. - Multiple small-mouthed diverticula were found in the left colon. - The colon was extremely spastic which prolonged the procedure. - The exam was otherwise without abnormality.   Path c/w adenomas - repeat colonoscopy in 3 years  Fecal pancreatic elastase 31 Fecal  lactoferrin negative  [place] 11/18/20 - IMPRESSION: 1. Cholelithiasis without sonographic evidence of acute cholecystitis. 2. Enlarged fatty liver may represent steatohepatitis. Clinical correlation is recommended  May 2022 MRCP IMPRESSION: 1. Normal pancreas. 2. Hepatic steatosis. 3. Cholelithiasis without evidence cholecystitis. No choledocholithiasis 4. Indeterminate lesion in the spleen likely represents a benign complex cyst or hemangioma.   Current Medications, Allergies, Past Medical History, Past Surgical History, Family History and Social History were reviewed in [employer] record.     Current Outpatient Medications  Medication Sig Dispense Refill   ALPHA LIPOIC ACID PO Take 400 mg by mouth daily.     amLODipine (NORVASC) 10 MG tablet Take 10 mg Daily by mouth.      anastrozole (ARIMIDEX) 1 MG tablet Take 1 mg daily by mouth.     ascorbic acid (VITAMIN C) 500 MG tablet Take 1,000 mg by mouth daily.     aspirin EC 81 MG tablet Take 81 mg by mouth daily.     atorvastatin (LIPITOR) 10 MG tablet Take 10 mg by mouth daily.  buPROPion (WELLBUTRIN XL) 150 MG 24 hr tablet Take 150 mg by mouth every morning.     buPROPion (WELLBUTRIN XL) 300 MG 24 hr tablet Take 300 mg by mouth Daily.      calcium-vitamin D (OSCAL WITH D) 250-125 MG-UNIT tablet Take 1 tablet by mouth daily.     chlorhexidine (PERIDEX) 0.12 % solution Use as directed 10 mLs in the mouth or throat 2 (two) times daily as needed.   0   Cholecalciferol 25 MCG (1000 UT) tablet Take 1,000 Units by mouth daily.      diazepam (VALIUM) 5 MG tablet Take 5-10 mg by mouth every 6 (six) hours as needed (for spasms).      diclofenac (VOLTAREN) 75 MG EC tablet Take 75 mg by mouth 2 (two) times daily as needed (for pain.).      Dulaglutide (TRULICITY) 1.5 BO/1.7PZ SOPN Inject into the skin.     fluocinonide ointment (LIDEX) 0.25 % Apply 1 application topically 2 (two) times daily as needed (IRRITATION).      fluticasone (FLONASE) 50 MCG/ACT nasal spray Place 1 spray into both nostrils daily as needed for allergies or rhinitis.     JARDIANCE 10 MG TABS tablet Take 10 mg by mouth daily.     Lancets (ONETOUCH ULTRASOFT) lancets 1 each by Other route daily.     lipase/protease/amylase (CREON) 36000 UNITS CPEP capsule Take 2 capsules (72,000 Units total) by mouth 3 (three) times daily with meals. May also take 1 capsule (36,000 Units total) as needed (with snacks). 240 capsule 11   metFORMIN (GLUCOPHAGE-XR) 500 MG 24 hr tablet Take 1,000 mg by mouth 2 (two) times daily.     MILK THISTLE PO Take 1 capsule by mouth daily.     mupirocin ointment (BACTROBAN) 2 % Apply topically 2 (two) times daily. to affected area     olmesartan (BENICAR) 20 MG tablet Take 20 mg by mouth daily.     Omega-3 Fatty Acids (FISH OIL) 1200 MG CAPS Take 1 capsule by mouth daily.      omeprazole (PRILOSEC) 20 MG capsule Take 1 capsule by mouth daily.     sertraline (ZOLOFT) 100 MG tablet Take 200 mg by mouth daily.      tamsulosin (FLOMAX) 0.4 MG CAPS capsule Take 0.4 mg by mouth daily.     testosterone cypionate (DEPOTESTOTERONE CYPIONATE) 200 MG/ML injection Inject 200 mg into the muscle every 14 (fourteen) days.   1   tiZANidine (ZANAFLEX) 2 MG tablet Take 1 tablet (2 mg total) by mouth every 8 (eight) hours as needed for muscle spasms. 15 tablet 0   traMADol (ULTRAM) 50 MG tablet Take 50 mg by mouth every 6 (six) hours as needed (for pain.).      TURMERIC PO Take 1,000 mg by mouth daily.      zolpidem (AMBIEN) 10 MG tablet Take 10 mg by mouth daily as needed. For sleep     No current facility-administered medications for this visit.    Review of Systems: No chest pain. No shortness of breath. No urinary complaints.   PHYSICAL EXAM :    Wt Readings from Last 3 Encounters:  05/28/21 255 lb (115.7 kg)  01/05/21 253 lb (114.8 kg)  11/10/20 262 lb (118.8 kg)    BP 128/74 (BP Location: Left Arm, Patient Position:  Sitting, Cuff Size: Normal)    Pulse 96    Ht 5\' 10"  (1.778 m)    Wt 250 lb 4 oz (113.5 kg)  BMI 35.91 kg/m  Constitutional:  pleasant male in no acute distress. Psychiatric:  Normal mood and affect. Behavior is normal. EENT: Pupils normal.  Conjunctivae are normal. No scleral icterus. Neck supple.  Cardiovascular: Normal rate, regular rhythm. No edema Pulmonary/chest: Effort normal and breath sounds normal. No wheezing, rales or rhonchi. Abdominal: Soft, protuberant, nontender. Bowel sounds active throughout. There are no masses palpable. No hepatomegaly. Neurological: Alert and oriented to person place and time. Skin: Skin is warm and dry. No rashes noted.  I spent 30 minutes total reviewing records, obtaining history, performing exam, counseling patient and documenting visit / findings.    Tye Savoy, NP  09/15/2021, 1:51 PM

## 2021-09-15 NOTE — Patient Instructions (Signed)
Will call next week to discuss pancreatic enzymes.   If you are age 73 or older, your body mass index should be between 23-30. Your Body mass index is 35.91 kg/m. If this is out of the aforementioned range listed, please consider follow up with your Primary Care Provider.  If you are age 75 or younger, your body mass index should be between 19-25. Your Body mass index is 35.91 kg/m. If this is out of the aformentioned range listed, please consider follow up with your Primary Care Provider.   ________________________________________________________  The [place] providers would like to encourage you to use [facility] to communicate with providers for non-urgent requests or questions.  Due to long hold times on the telephone, sending your provider a message by [facility] may be a faster and more efficient way to get a response.  Please allow 48 business hours for a response.  Please remember that this is for non-urgent requests.  _______________________________________________________

## 2021-09-15 NOTE — Progress Notes (Signed)
Agree with assessment as outlined. He recently had an MRCP, I don't think further workup is needed at this time. He needs to stop all alcohol use I think okay to take 3 Creon per meal. He weighs 250lbs, according to drug insert, if > 230lbs the min dose is 2 x 36K units per meal, but the max is 7 caps per meal. I don't think he needs max dose but if he wants to take 3 or 4 caps per meal, if it helps him, I think that is fine. Thanks

## 2021-09-19 ENCOUNTER — Telehealth: Payer: Self-pay

## 2021-09-19 NOTE — Telephone Encounter (Signed)
Called the patient back. No answer. Left a very detailed message for the patient.

## 2021-09-19 NOTE — Telephone Encounter (Signed)
Inbound cal from patient returning nurse's call.  Requests for nurse to leave detailed voice mail if he does not answer.

## 2021-09-19 NOTE — Telephone Encounter (Signed)
Patient returning your call, states that he has some questions. Seeking advice, please advise.

## 2021-09-19 NOTE — Telephone Encounter (Signed)
Left message for patient to please call back. 

## 2021-09-19 NOTE — Telephone Encounter (Signed)
-----   Message from Willia Craze, NP sent at 09/17/2021  7:59 PM EST ----- Eustaquio Maize, please let patient know I spoke with Dr. Havery Moros about hight dose of Creon. He can continue 2 Creon with snacks and take up to 4 with meals.  I told him I would get some dietary information for him for EPI:  In general should avoid highly processed foods , foods  high in trans fat, hydrogenated oils, and saturated fat. Instead look for foods that contain monounsaturated fat, polyunsaturated fat, omega-3 fatty acids. Olive oil, peanut oil, nuts, seeds, and fish, such as salmon and tuna, all contain healthy fats.

## 2021-09-20 ENCOUNTER — Other Ambulatory Visit: Payer: Self-pay

## 2021-09-20 ENCOUNTER — Ambulatory Visit
Admission: RE | Admit: 2021-09-20 | Discharge: 2021-09-20 | Disposition: A | Discharge status: Home / Self Care | Payer: Medicare Other | Source: Ambulatory Visit | Attending: Physician Assistant | Admitting: Physician Assistant

## 2021-09-20 DIAGNOSIS — M4014 Other secondary kyphosis, thoracic region: Secondary | ICD-10-CM

## 2021-09-20 DIAGNOSIS — M546 Pain in thoracic spine: Secondary | ICD-10-CM

## 2021-09-20 DIAGNOSIS — M4327 Fusion of spine, lumbosacral region: Secondary | ICD-10-CM

## 2021-09-20 DIAGNOSIS — M545 Low back pain, unspecified: Secondary | ICD-10-CM

## 2021-09-20 NOTE — Telephone Encounter (Signed)
Called. Voicemail. Left a message of my return call.

## 2021-09-20 NOTE — Telephone Encounter (Signed)
Called the patient back. Voicemail picked up again. Did not leave a message this time.

## 2021-09-20 NOTE — Telephone Encounter (Signed)
Patient is returning your call.  

## 2021-09-22 ENCOUNTER — Ambulatory Visit
Admission: RE | Admit: 2021-09-22 | Discharge: 2021-09-22 | Disposition: A | Discharge status: Home / Self Care | Payer: Medicare Other | Source: Ambulatory Visit | Attending: Physician Assistant | Admitting: Physician Assistant

## 2021-09-22 ENCOUNTER — Other Ambulatory Visit: Payer: Self-pay

## 2021-09-22 DIAGNOSIS — M546 Pain in thoracic spine: Secondary | ICD-10-CM

## 2021-09-22 DIAGNOSIS — M4014 Other secondary kyphosis, thoracic region: Secondary | ICD-10-CM

## 2021-09-22 DIAGNOSIS — M4327 Fusion of spine, lumbosacral region: Secondary | ICD-10-CM

## 2021-09-22 DIAGNOSIS — M545 Low back pain, unspecified: Secondary | ICD-10-CM

## 2021-09-26 NOTE — Telephone Encounter (Signed)
Called the patient. Voicemail answers. Left him the exact message from [place]. Advised if he has questions to call back. Closing this encounter.

## 2021-10-17 ENCOUNTER — Other Ambulatory Visit: Payer: Self-pay

## 2021-10-17 MED ORDER — PANCRELIPASE (LIP-PROT-AMYL) 36000-114000 UNITS PO CPEP: ORAL_CAPSULE | ORAL | 11 refills | Status: DC

## 2021-10-25 ENCOUNTER — Other Ambulatory Visit: Payer: Self-pay

## 2021-10-25 ENCOUNTER — Encounter (HOSPITAL_BASED_OUTPATIENT_CLINIC_OR_DEPARTMENT_OTHER): Payer: Self-pay | Admitting: Physical Therapy

## 2021-10-25 ENCOUNTER — Ambulatory Visit: Payer: Medicare Other | Admitting: Gastroenterology

## 2021-10-25 ENCOUNTER — Ambulatory Visit (HOSPITAL_BASED_OUTPATIENT_CLINIC_OR_DEPARTMENT_OTHER): Payer: Medicare Other | Attending: Physical Medicine and Rehabilitation | Admitting: Physical Therapy

## 2021-10-25 DIAGNOSIS — M546 Pain in thoracic spine: Secondary | ICD-10-CM | POA: Insufficient documentation

## 2021-10-25 DIAGNOSIS — M6281 Muscle weakness (generalized): Secondary | ICD-10-CM | POA: Insufficient documentation

## 2021-10-25 DIAGNOSIS — M545 Low back pain, unspecified: Secondary | ICD-10-CM | POA: Diagnosis present

## 2021-10-25 DIAGNOSIS — R262 Difficulty in walking, not elsewhere classified: Secondary | ICD-10-CM | POA: Diagnosis present

## 2021-10-25 NOTE — Therapy (Addendum)
OUTPATIENT PHYSICAL THERAPY THORACOLUMBAR EVALUATION   Patient Name: Robert Castro MRN: [id number] DOB:10-16-1950, 71 y.o., male Today's Date: 10/25/2021   PT End of Session - 10/25/21 1728     Visit Number 1    Number of Visits 24    PT Start Time 1400    PT Stop Time 1500    PT Time Calculation (min) 60 min    Activity Tolerance Patient tolerated treatment well    Behavior During Therapy WFL for tasks assessed/performed             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    stenosis and arthritis   Depression    Diabetes ([place])    Diverticulosis    ED (erectile dysfunction)    Fatty liver    GERD (gastroesophageal reflux disease)    HOH (hard of hearing)    Hyperlipidemia    Hypertension    Hypothyroidism    Internal hemorrhoids    Leg edema, left    Macular degeneration    Neuromuscular disorder ([facility])    Osteoarthritis    Peripheral neuropathy    Sleep apnea    cpap   Testosterone deficiency    Tremors of nervous system    Umbilical hernia    Past Surgical History:  Procedure Laterality Date   BACK SURGERY  2009   BACK SURGERY  09/2014   Dr Marykay Lex   CATARACT EXTRACTION, BILATERAL  12/2016   COLONOSCOPY     ESOPHAGOGASTRODUODENOSCOPY (EGD) WITH PROPOFOL N/A 09/16/2017   Procedure: ESOPHAGOGASTRODUODENOSCOPY (EGD) WITH PROPOFOL;  Surgeon: Yetta Flock, MD;  Location: [facility];  Service: Gastroenterology;  Laterality: N/A;   LUMBAR LAMINECTOMY/ DECOMPRESSION WITH MET-RX  2014   TONSILLECTOMY  8916   UMBILICAL HERNIA REPAIR     Patient Active Problem List   Diagnosis Date Noted   Genetic testing 05/01/2018   Adenomatous duodenal polyp    Diarrhea 05/05/2015   GERD 12/01/2007   DYSPHAGIA UNSPECIFIED 12/01/2007   INTERNAL HEMORRHOIDS 03/06/2007   BARRETTS ESOPHAGUS  01/29/2007   DIVERTICULOSIS, COLON 08/01/2005   COLONIC POLYPS, ADENOMATOUS 07/09/2002    PCP: Aletha Halim., PA-C  REFERRING PROVIDER: Doran Clay, MD  REFERRING DIAG:   M48.00  (ICD-10-CM) - Spinal stenosis, site unspecified           G62.9 (ICD-10-CM) - Polyneuropathy, unspecified M19.90 (ICD-10-CM) - Unspecified osteoarthritis, unspecified site           M48.061 (ICD-10-CM) - Spinal stenosis, 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  Pt slipped on water at home and fell in early October.  Pt had a loss of consciousness and went to the ER.  He didn't have a concussion and had x rays.  Pt states his fusion was intact.  Pt had pain a couple of days later and reports having constant pain.  He went to see his surgeon Dr. Maryclare Bean Hey who stated his fusion was fine.  Pt continued to have constant pain and had a CT scan of lumbar and MRI on lumbar.  When Dr. Marykay Lex saw the imaging, he recommended surgery.   Pt underwent posterior T3-Iliac Wing revision extension instrumentation and fusion with T10 Laminectomy, pedicle fixation and rods 13 or more vertebral segments, pelvic fixation other than sacrum, and laminectomy, facetectomy, foraminotomy on 1 lumbar segment on 10/03/2021.  PT records in [place] indicated T9-L2 revision extension instrumentation and fusion.  Pt has B/L/T  precautions, no bending, lifting, and twisting.  Pt was discharged from hospital to rehab facility which he stayed for 9 days.  He received PT and OT and has a HEP handout.   Pt states he is able to stand up straighter and walk with improved posture with less pain.  He reports having mm spasms.  He is limited with his ADLs, standing activities, and normal functional mobility skills including bed mobility, transfers, and ambulation.  Pt is limited with ambulation distance and states he is slow with walking.  He is unable to lift and hold instruments to play music.  He is unable to swim and perform any workout activities in the fitness center.      PERTINENT HISTORY:  -posterior T3-Iliac Wing revision extension instrumentation and fusion with T10 Laminectomy, pedicle fixation and rods 13 or more vertebral segments, pelvic fixation other than sacrum, and laminectomy, facetectomy, foraminotomy on 1 lumbar segment on 10/03/2021. -Hx of 3 prior back surgeries including fusion and decompression; 2016 lumbar fusion -Neuropathy, degenerative scoliosis, R foot drop,  Macular degeneration, DM, depression, and anxiety   PAIN:  Are you having pain? Yes NPRS scale: 6/10 current, 10/10 worst, 3/10 best Pain location: Mid and lower thoracic and lumbar pain.  Pt denies pain in LE's.   PRECAUTIONS: Back and Fall; Pt had fusion and laminectomy.  He has bending, lifting, and twisting precautions.  Falls due to neuropathy.  Pt has R foot drop.  Multiple prior back surgeries.  WEIGHT BEARING RESTRICTIONS No  FALLS:  Has patient fallen in last 6 months? Yes, Number of falls: 3  LIVING ENVIRONMENT: Lives with: lives alone Lives in: 1 level home Stairs: 3-4 steps with rail to enter the front Has following equipment at home: Single point cane, Walker - 2 wheeled, and Environmental consultant - 4 wheeled  OCCUPATION: Pt is retired.  PLOF: Independent; Pt used SPC on occasion especially at night.  He was able to ambulate community  distance without significant difficulty. He was careful with stairs.  Pt states he has had falls since 2019 due to neuropathy.  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  Pt is a member at [employer] and was working out by swimming and using some of the machines in the fitness center.     PATIENT GOALS wants to be able to travel, improve ambulation, back and LE strength. Pt would like to get in the water when he can.    OBJECTIVE:  DIAGNOSTIC FINDINGS:  X rays, CT scan, and MRI before surgery.  PATIENT SURVEYS:  FOTO 50 with a goal of 52 at visit #12.   COGNITION:  Overall cognitive status: Within functional limits for tasks assessed    OBSERVATION: Incision in central spine form upper thoracic to upper lumbar.  Incision has some scabbing at proximal incision and mid to distal incision.  It is healing well and has no drainage and no signs of infection.    SENSATION:  Light touch: Appears intact; 2+ sensation to LT t/o bilat LE dermatomes    POSTURE:  Kyphotic posture and fwd flexed posture.  Pt leans to the left.  PALPATION: Pt had no significant tenderness with palpation of lumbar and thoracic paraspinals.    LE AROM/PROM:  A/PROM Right 10/25/2021 Left 10/25/2021  Hip flexion    Hip extension    Hip abduction    Hip adduction    Hip internal rotation    Hip external rotation    Knee flexion    Knee extension    Ankle dorsiflexion 3/12 17  Ankle plantarflexion 54 40  Ankle inversion    Ankle eversion     (Blank rows = not tested)  LE Strength: Strength not formally tested. Pt able to extend bilat knees in sitting though not through full range. Pt has a hx of R foot drop and is limited with active DF.  He is unable to perform standing toe raises well on R LE.  FUNCTIONAL TESTS:  Pt uses hand for sit/stand transfers and performs independently.  He is very slow and tentative with stand to sit transfers.      GAIT: Assistive device utilized: Single point cane Comments: Pt ambulates with minimally forward flexed posture and leftward lean.  He has decreased gait speed and decreased step length bilat.  He ambulated with and without cane.      TODAY'S TREATMENT  Reviewed HEP.  Pt states he has been performing standing exercises including hip abd, hip ext, and mini squats.  Pt also was performing some T band exercises.  Educated pt in what exercises to avoid per protocol.   See below for pt education.   PATIENT EDUCATION:  Education details: PT answered Pt's questions.  POC, objective findings, post op protocol and restrictions including bending, lifting, and twisting precautions.  Importance of walking without aggravating or increasing sx's.   Person educated: Patient Education method: Explanation Education comprehension: verbalized understanding and needs further education   HOME EXERCISE PROGRAM: Pt has a HEP from rehab facility.  Will provide pt with HEP next visit.   ASSESSMENT:  CLINICAL IMPRESSION: Patient is a 71 y.o. male 3 weeks and 1 day s/p posterior T3-Iliac Wing revision extension instrumentation and fusion with T10 Laminectomy, pedicle fixation and rods 13 or more vertebral segments, pelvic fixation other than sacrum, and laminectomy, facetectomy, foraminotomy on 1 lumbar segment.  He has a hx of 3 prior back surgeries including lumbar fusion.  Pt states he is able to stand up straighter and walk with improved posture with less pain since surgery.  He is limited with his ADLs, standing activities, and normal functional mobility skills including bed mobility, transfers, and ambulation.  He enjoys playing music but is unable to lift and hold instruments to play music.  He is unable to perform his normal workout activities.  Pt should benefit from skilled PT services per protocol to address impairments and goals and improve overall function.  OBJECTIVE IMPAIRMENTS Abnormal  gait, decreased activity tolerance, decreased balance, decreased endurance, decreased mobility, difficulty walking, decreased ROM, decreased strength, hypomobility, impaired flexibility, postural dysfunction, and pain.   ACTIVITY LIMITATIONS cleaning, community activity, laundry, shopping, and ambulation .   PERSONAL FACTORS 3+ comorbidities: Hx of prior back surgeries including lumbar fusion, Neuropathy, R foot drop,  Macular degeneration, and DM   are also affecting patient's functional outcome.    REHAB POTENTIAL: Good  CLINICAL DECISION MAKING: Stable/uncomplicated  EVALUATION COMPLEXITY: Low   GOALS:  SHORT TERM GOALS:  STG Name Target Date Goal status  1 Pt will be independent and compliant with HEP for improved pain, strength, mobility, and function.  Baseline:  11/22/2021 INITIAL  2 Pt will demo improved gait speed, posture, and bilat step length with gait for improved community ambulation.  Baseline:  11/22/2021 INITIAL  3 Pt will be able to perform his normal daily transfers without difficulty.   Baseline: 12/06/2021 INITIAL  4 Pt will progress his walking program without adverse effects for improved tolerance with activity and ambulation.   Baseline: 12/20/2021 INITIAL  5 Pt will demo good form with a log roll for improved bed mobility Baseline: 11/15/2021 INITIAL             LONG TERM GOALS:   LTG Name Target Date Goal status  1 Pt will be able to ambulate extended community distance with LRAD without significant difficulty and pain.  Baseline: 01/17/2022 INITIAL  2 Pt will be able to play music without significant pain.  Baseline: 01/17/2022 INITIAL  3 Pt will demo improved core strength by progressing with core exercises per protocol without adverse effects for improved tolerance to and performance of daily activities and functional mobility.  Baseline: 01/17/2022 INITIAL  4 Pt will be able to his perform normal ADLs, standing activities and appropriate household chores  without significant pain or difficulty.  Baseline: 01/17/2022 INITIAL  5 Pt will be able to ambulate in grocery store and shop without significant pain.  Baseline: 01/03/2022 INITIAL             PLAN: PT FREQUENCY:  1-2 times per week for 2-3 weeks and 2 times per week afterwards  PT DURATION: 12 weeks  PLANNED INTERVENTIONS: Therapeutic exercises, Therapeutic activity, Neuromuscular re-education, Balance training, Gait training, Patient/Family education, Joint mobilization, Stair training, DME instructions, Aquatic Therapy, Dry Needling, Electrical stimulation, Cryotherapy, Moist heat, Taping, and Manual therapy  PLAN FOR NEXT SESSION:  Teach log rolling.  Cont per fusion protocol.  Pt will bring in his HEP handout next visit.  Perform TrA isometric contractions, quad sets, SAQ, and Ankle pumps and add to HEP per pt tolerance.   Pt sees MD tomorrow.     Selinda Michaels III PT, DPT 10/25/21 10:45 PM

## 2021-10-27 ENCOUNTER — Ambulatory Visit: Payer: Medicare Other | Admitting: Gastroenterology

## 2021-11-01 ENCOUNTER — Ambulatory Visit (HOSPITAL_BASED_OUTPATIENT_CLINIC_OR_DEPARTMENT_OTHER): Payer: Medicare Other | Admitting: Physical Therapy

## 2021-11-01 ENCOUNTER — Telehealth (HOSPITAL_BASED_OUTPATIENT_CLINIC_OR_DEPARTMENT_OTHER): Payer: Self-pay | Admitting: Physical Therapy

## 2021-11-01 NOTE — Telephone Encounter (Signed)
Patient did not show for PT apppointment.  Called and left HIPAA compliant message requesting patient return phone call to confirm next upcoming appt.  [phone]. ? ?Kerin Perna, PTA ?11/01/21 2:30 PM ? ?

## 2021-11-16 ENCOUNTER — Ambulatory Visit (HOSPITAL_BASED_OUTPATIENT_CLINIC_OR_DEPARTMENT_OTHER): Payer: Medicare Other | Admitting: Physical Therapy

## 2021-11-16 ENCOUNTER — Encounter (HOSPITAL_BASED_OUTPATIENT_CLINIC_OR_DEPARTMENT_OTHER): Payer: Self-pay | Admitting: Physical Therapy

## 2021-11-16 ENCOUNTER — Other Ambulatory Visit: Payer: Self-pay

## 2021-11-16 DIAGNOSIS — M6281 Muscle weakness (generalized): Secondary | ICD-10-CM

## 2021-11-16 DIAGNOSIS — M546 Pain in thoracic spine: Secondary | ICD-10-CM | POA: Diagnosis not present

## 2021-11-16 DIAGNOSIS — M545 Low back pain, unspecified: Secondary | ICD-10-CM

## 2021-11-16 NOTE — Therapy (Signed)
?OUTPATIENT PHYSICAL THERAPY THORACOLUMBAR EVALUATION ? ? ?Patient Name: Robert Castro ?MRN: [id number] ?DOB:Jun 25, 1951, 71 y.o., male ?Today's Date: 11/16/2021 ? ? PT End of Session - 11/16/21 1741   ? ? Visit Number 2   ? Number of Visits 24   ? PT Start Time 0258   ? PT Stop Time 1730   ? PT Time Calculation (min) 45 min   ? Activity Tolerance Patient tolerated treatment well   ? Behavior During Therapy [facility] for tasks assessed/performed   ? ?  ?  ? ?  ? ? ? ?Past Medical History:  ?Diagnosis Date  ? Adenomatous duodenal polyp   ? Arthritis   ? Barrett's esophagus   ? Benign enlargement of prostate   ? Cataract   ? both eyes  ? 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 stenosis and arthritis  ? Depression   ? Diabetes ([facility])   ? Diverticulosis   ? ED (erectile dysfunction)   ? Fatty liver   ? GERD (gastroesophageal reflux disease)   ? HOH (hard of hearing)   ? Hyperlipidemia   ? Hypertension   ? Hypothyroidism   ? Internal hemorrhoids   ? Leg edema, left   ? Macular degeneration   ? Neuromuscular disorder ([place])   ? Osteoarthritis   ? Peripheral neuropathy   ? Sleep apnea   ? cpap  ? Testosterone deficiency   ? Tremors of nervous system   ? Umbilical hernia   ? ?Past Surgical History:  ?Procedure Laterality Date  ? BACK SURGERY  2009  ? BACK SURGERY  09/2014  ? Robert Castro  ? CATARACT EXTRACTION, BILATERAL  12/2016  ? COLONOSCOPY    ? ESOPHAGOGASTRODUODENOSCOPY (EGD) WITH PROPOFOL N/A 09/16/2017  ? Procedure: ESOPHAGOGASTRODUODENOSCOPY (EGD) WITH PROPOFOL;  Surgeon: Robert Flock, MD;  Location: [facility];  Service: Gastroenterology;  Laterality: N/A;  ? LUMBAR LAMINECTOMY/ DECOMPRESSION WITH MET-RX  2014  ? TONSILLECTOMY  1956  ? UMBILICAL HERNIA REPAIR    ? ?Patient Active Problem List  ? Diagnosis Date Noted  ? Genetic testing 05/01/2018  ? Adenomatous duodenal polyp   ? Diarrhea 05/05/2015  ? GERD 12/01/2007  ? DYSPHAGIA UNSPECIFIED 12/01/2007  ? INTERNAL HEMORRHOIDS 03/06/2007  ? BARRETTS  ESOPHAGUS 01/29/2007  ? DIVERTICULOSIS, COLON 08/01/2005  ? COLONIC POLYPS, ADENOMATOUS 07/09/2002  ? ? ?PCP: Robert Castro., PA-C ? ?REFERRING PROVIDER: Aletha Castro., PA-C ? ?REFERRING DIAG:  ? M48.00  (ICD-10-CM) - Spinal stenosis, site unspecified ?          G62.9 (ICD-10-CM) - Polyneuropathy, unspecified ?M19.90 (ICD-10-CM) - Unspecified osteoarthritis, unspecified site ?          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           M62.81 (ICD-10-CM) - Muscle weakness (generalized) ? ?THERAPY DIAG:  ?Pain in thoracic spine ? ?Low back pain, unspecified back pain laterality, unspecified chronicity, unspecified whether sciatica present ? ?Muscle weakness (generalized) ? ?ONSET DATE: October 2022 ? ?SUBJECTIVE:                                                                                                                                                                                          ? ?  SUBJECTIVE STATEMENT: ?Pt has a hx of 3 prior back surgeries including fusion and decompression.  Pt slipped on water at home and fell in early October.  Pt had a loss of consciousness and went to the ER.  He didn't have a concussion and had x rays.  Pt states his fusion was intact.  Pt had pain a couple of days later and reports having constant pain.  He went to see his surgeon Robert. Maryclare Bean Castro who stated his fusion was fine.  Pt continued to have constant pain and had a CT scan of lumbar and MRI on lumbar.  When Robert. Marykay Castro saw the imaging, he recommended surgery.  ? ?Pt underwent posterior T3-Iliac Wing revision extension instrumentation and fusion with T10 Laminectomy, pedicle fixation and rods 13 or more vertebral segments, pelvic fixation other than sacrum, and laminectomy, facetectomy, foraminotomy on 1 lumbar segment on 10/03/2021.  PT records in [place] indicated T9-L2 revision extension instrumentation and fusion.  Pt has B/L/T precautions, no bending, lifting, and  twisting.  Pt was discharged from hospital to rehab facility which he stayed for 9 days.  He received PT and OT and has a HEP handout.  ? ?Pt states he is able to stand up straighter and walk with improved posture with less pain.  He reports having mm spasms.  He is limited with his ADLs, standing activities, and normal functional mobility skills including bed mobility, transfers, and ambulation.  Pt is limited with ambulation distance and states he is slow with walking.  He is unable to lift and hold instruments to play music.  He is unable to swim and perform any workout activities in the fitness center.    ? ? ?3/23 ?Robert Castro presents to session. ? ?Pt's fiance states that he fell this morning while trying to getting to the tub. Pt states he did not get his head this morning. He states he did not lose consciousness. Fiance states she had to call EMS in order to get him up. She is not here all the time. She lives in [place] about 50% of the time. In the last 3 weeks, the pt has had significant weakness that is new in nature and he is unable to stand/walk for long distances. She also reports there being a mental status change with increased levels of confusion and understanding. Pt has also fallen >5 times in the last 3 weeks.  ? ?PERTINENT HISTORY:  ?-posterior T3-Iliac Wing revision extension instrumentation and fusion with T10 Laminectomy, pedicle fixation and rods 13 or more vertebral segments, pelvic fixation other than sacrum, and laminectomy, facetectomy, foraminotomy on 1 lumbar segment on 10/03/2021. ?-Hx of 3 prior back surgeries including fusion and decompression; 2016 lumbar fusion ?-Neuropathy, degenerative scoliosis, R foot drop,  Macular degeneration, DM, depression, and anxiety  ? ?PAIN:  ?Are you having pain? Yes ?NPRS scale: 6/10 current, 10/10 worst, 3/10 best ?Pain location: Mid and lower thoracic and lumbar pain.  Pt denies pain in LE's. ? ? ?PRECAUTIONS: Back and Fall; Pt had fusion and  laminectomy.  He has bending, lifting, and twisting precautions.  Falls due to neuropathy.  Pt has R foot drop.  Multiple prior back surgeries. ? ? ?FALLS:  ?Has patient fallen in last 6 months? Yes, Number of falls: 3 ? ?LIVING ENVIRONMENT: ?Lives with: lives alone ?Lives in: 1 level home ?Stairs: 3-4 steps with rail to enter the front ?Has following equipment at home: Single point cane, Walker - 2 wheeled, and [employer] -  4 wheeled ? ?OCCUPATION: Pt is retired. ? ?PLOF: Independent; Pt used SPC on occasion especially at night.  He was able to ambulate community distance without significant difficulty. He was careful with stairs.  Pt states he has had falls since 2019 due to neuropathy.  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  Pt is a member at [employer] and was working out by swimming and using some of the machines in the fitness center.    ? ?PATIENT GOALS wants to be able to travel, improve ambulation, back and LE strength. Pt would like to get in the water when he can.  ? ? ?OBJECTIVE:  ? ?DIAGNOSTIC FINDINGS:  ?X rays, CT scan, and MRI before surgery. ? ?PATIENT SURVEYS:  ?FOTO 50 with a goal of 52 at visit #12. ? ? ?COGNITION: ? Overall cognitive status: Impaired from baseline  ? Poor awareness of current surgical precautions  ? ?FUNCTIONAL TESTS:  ?Unable to perform STS transfers without minA from therapist and fiance to steady W/C. Poor safety awareness as well as significant tetany with stand and sit ?  ? ?GAIT: ?Assistive device utilized: Single point cane ?Comments: 52f, grossly unsteady with tremor-like movements at LE and UE; widened BOS, CGA throughout ? ? ? ?TODAY'S TREATMENT  ?Pt edu about current functional status, safety, need for HHPT, and current deficits ? ? ?PATIENT EDUCATION:  ?Education details: PT answered Pt's questions.  POC, objective findings, post op protocol and restrictions including bending, lifting, and twisting precautions.   Importance of walking without aggravating or increasing sx's.   ?Person educated: Patient ?Education method: Explanation ?Education comprehension: verbalized understanding and needs further education ? ? ?HOME EXERCISE PRO

## 2021-11-23 ENCOUNTER — Encounter (HOSPITAL_BASED_OUTPATIENT_CLINIC_OR_DEPARTMENT_OTHER): Payer: Medicare Other | Admitting: Physical Therapy

## 2021-11-27 ENCOUNTER — Ambulatory Visit (HOSPITAL_BASED_OUTPATIENT_CLINIC_OR_DEPARTMENT_OTHER): Payer: Medicare Other | Admitting: Physical Therapy

## 2021-11-30 ENCOUNTER — Encounter (HOSPITAL_BASED_OUTPATIENT_CLINIC_OR_DEPARTMENT_OTHER): Payer: Medicare Other | Admitting: Physical Therapy

## 2021-12-04 ENCOUNTER — Ambulatory Visit (HOSPITAL_BASED_OUTPATIENT_CLINIC_OR_DEPARTMENT_OTHER): Payer: Medicare Other | Admitting: Physical Therapy

## 2021-12-06 ENCOUNTER — Ambulatory Visit (HOSPITAL_BASED_OUTPATIENT_CLINIC_OR_DEPARTMENT_OTHER): Payer: Medicare Other | Admitting: Physical Therapy

## 2021-12-10 NOTE — Therapy (Addendum)
?OUTPATIENT PHYSICAL THERAPY PROGRESS NOTE ? ? ?Progress Note ?Reporting Period 10/25/2021 to 12/11/2021 ? ?See note below for Objective Data and Assessment of Progress/Goals.  ? ? ? ? ? ?Patient Name: Robert Castro ?MRN: [id number] ?DOB:05-20-1951, 71 y.o., male ?Today's Date: 12/11/2021 ? ? PT End of Session - 12/11/21 1544   ? ? Visit Number 3   ? Number of Visits 19   ? Date for PT Re-Evaluation 02/05/22   ? Authorization Type Medicare A and B   ? Progress Note Due on Visit 13   ? PT Start Time 2878   ? PT Stop Time 1530   ? PT Time Calculation (min) 45 min   ? Activity Tolerance Patient tolerated treatment well   ? Behavior During Therapy [facility] for tasks assessed/performed   ? ?  ?  ? ?  ? ? ? ? ?Past Medical History:  ?Diagnosis Date  ? Adenomatous duodenal polyp   ? Arthritis   ? Barrett's esophagus   ? Benign enlargement of prostate   ? Cataract   ? both eyes  ? 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 stenosis and arthritis  ? Depression   ? Diabetes ([place])   ? Diverticulosis   ? ED (erectile dysfunction)   ? Fatty liver   ? GERD (gastroesophageal reflux disease)   ? HOH (hard of hearing)   ? Hyperlipidemia   ? Hypertension   ? Hypothyroidism   ? Internal hemorrhoids   ? Leg edema, left   ? Macular degeneration   ? Neuromuscular disorder ([place])   ? Osteoarthritis   ? Peripheral neuropathy   ? Sleep apnea   ? cpap  ? Testosterone deficiency   ? Tremors of nervous system   ? Umbilical hernia   ? ?Past Surgical History:  ?Procedure Laterality Date  ? BACK SURGERY  2009  ? BACK SURGERY  09/2014  ? Dr Marykay Lex  ? CATARACT EXTRACTION, BILATERAL  12/2016  ? COLONOSCOPY    ? ESOPHAGOGASTRODUODENOSCOPY (EGD) WITH PROPOFOL N/A 09/16/2017  ? Procedure: ESOPHAGOGASTRODUODENOSCOPY (EGD) WITH PROPOFOL;  Surgeon: Yetta Flock, MD;  Location: [facility];  Service: Gastroenterology;  Laterality: N/A;  ? LUMBAR LAMINECTOMY/ DECOMPRESSION WITH MET-RX  2014  ? TONSILLECTOMY  1956  ? UMBILICAL HERNIA REPAIR     ? ?Patient Active Problem List  ? Diagnosis Date Noted  ? Genetic testing 05/01/2018  ? Adenomatous duodenal polyp   ? Diarrhea 05/05/2015  ? GERD 12/01/2007  ? DYSPHAGIA UNSPECIFIED 12/01/2007  ? INTERNAL HEMORRHOIDS 03/06/2007  ? BARRETTS ESOPHAGUS 01/29/2007  ? DIVERTICULOSIS, COLON 08/01/2005  ? COLONIC POLYPS, ADENOMATOUS 07/09/2002  ? ? ?PCP: Aletha Halim., PA-C ? ?REFERRING PROVIDER: Doran Clay, MD  ? ?REFERRING DIAG:  ? M48.00  (ICD-10-CM) - Spinal stenosis, site unspecified ?          G62.9 (ICD-10-CM) - Polyneuropathy, unspecified ?M19.90 (ICD-10-CM) - Unspecified osteoarthritis, unspecified site ?          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           M62.81 (ICD-10-CM) - Muscle weakness (generalized) ? ?THERAPY DIAG:  ?Pain in thoracic spine ? ?Low back pain, unspecified back pain laterality, unspecified chronicity, unspecified whether sciatica present ? ?Muscle weakness (generalized) ? ?Difficulty in walking, not elsewhere classified ? ?ONSET DATE: October 2022 ? ?SUBJECTIVE:                                                                                                                                                                                          ? ?  SUBJECTIVE STATEMENT: ?-Pt has a hx of 3 prior back surgeries including fusion and decompression.  Pt slipped on water at home and fell in early October.  Pt had a loss of consciousness and went to the ER.  He didn't have a concussion and had x rays.  Pt states his fusion was intact.  Pt had pain a couple of days later and reports having constant pain.  He went to see his surgeon Dr. Maryclare Bean Hey who stated his fusion was fine.  Pt continued to have constant pain and had a CT scan of lumbar and MRI on lumbar.  When Dr. Marykay Lex saw the imaging, he recommended surgery.  ? ?-Pt underwent posterior T3-Iliac Wing revision extension instrumentation and fusion with T10 Laminectomy, pedicle  fixation and rods 13 or more vertebral segments, pelvic fixation other than sacrum, and laminectomy, facetectomy, foraminotomy on 1 lumbar segment on 10/03/2021.  PT records in [place] indicated T9-L2 revision extension instrumentation and fusion.  Pt has B/L/T precautions, no bending, lifting, and twisting.  Pt was discharged from hospital to rehab facility which he stayed for 9 days.  He received PT and OT and has a HEP handout.  ? ?-Pt is 9 weeks and 6 days post op.  He was evaluated on 10/25/2021.  MD notes indicated pt presented to [facility] for evaluation of Encephalopathy, toxic on 11/17/2021.  He was referred for inpatient psychiatric consultation by Hospital Medicine for encephalopathy and paranoia.   He was hospitalized at [facility] from 11/17/2021 - 12/08/2021 with altered mental status, unspecified altered mental status type.  Pt had a brain CT and MRI which showed no acute intracranial abnormalities.  Pt had confusion and is unsure of what all was done while in the hospital previously.  Pt reports he was instructed to not use alcohol with meds.  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  Pt brought in a MD note indicating pt to participate in assisted water aquatics.   ? ?-Pt states he is making slow progress with function and mobility and has made more progress mentally.  Pt states his pain is improving.  Pt reports having increased pain with transfers and ambulation.  He is limited with his ADLs, standing activities, and normal functional mobility skills including bed mobility, transfers, and ambulation.  He has not been playing music and is unable to swim and perform his workout activities in the fitness center.  Pt states he came to [place] recently and tried the recumbent bike.  He stopped due to having pain.  Pt instructed to not use recumbent bike at this time.  Pt states his fiance is his caregiver.    ? ?PERTINENT HISTORY:  ?-posterior T3-Iliac Wing revision  extension instrumentation and fusion with T10 Laminectomy, pedicle fixation and rods 13 or more vertebral segments, pelvic fixation other than sacrum, and laminectomy, facetectomy, foraminotomy on 1 lumbar segment on 10/03/2021. ?-Hx of 3 prior back surgeries including fusion and decompression; 2016 lumbar fusion ?-Neuropathy, degenerative scoliosis, R foot drop,  Warnicke Korsakov syndrome, Macular degeneration, DM, depression, and anxiety  ? ?PAIN:  ?Are you having pain? Yes ?NPRS scale: 0-3/10 current, 6-9/10 worst, 0/10 best ?Pain location: Mid and lower thoracic and lumbar pain.  Pt denies pain in LE's. ? ? ?PRECAUTIONS: Back and Fall; Pt had fusion and laminectomy.  He has bending, lifting, and twisting precautions.  Falls due to neuropathy.  Pt has R foot drop.  Multiple prior back surgeries. ? ? ?  FALLS:  ?Has patient fallen in last 6 months? Yes, Number of falls: 3 ? ?LIVING ENVIRONMENT: ?Lives with: lives alone ?Lives in: 1 level home ?Stairs: 3-4 steps with rail to enter the front ?Has following equipment at home: Single point cane, Walker - 2 wheeled, and Tiger - 4 wheeled ? ?OCCUPATION: Pt is retired. ? ?PLOF: Independent; Pt used SPC on occasion especially at night.  He was able to ambulate community distance without significant difficulty. He was careful with stairs.  Pt states he has had falls since 2019 due to neuropathy.  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  Pt is a member at [employer] and was working out by swimming and using some of the machines in the fitness center.    ? ?PATIENT GOALS wants to be able to travel, improve ambulation, back and LE strength. Pt would like to get in the water when he can.  ? ? ?OBJECTIVE:  ? ?DIAGNOSTIC FINDINGS:  ?X rays, CT scan, and MRI before surgery. ?brain CT and MRI which showed no acute intracranial abnormalities. ? ?TODAY'S TREATMENT  ? ?Physical Performance Testing: ? Assessed gait, ROM, strength,  tenderness, sit to stand transfer, pain level, and reported function. ?Pt completed FOTO.  ? ? ?PATIENT SURVEYS:  ?FOTO 47 with a goal of 52 at visit #12. ?Risk Adjusted Statistical FOTO* 80. ?FOTO has decreased by 3 points

## 2021-12-11 ENCOUNTER — Ambulatory Visit (HOSPITAL_BASED_OUTPATIENT_CLINIC_OR_DEPARTMENT_OTHER): Payer: Medicare Other | Admitting: Physical Therapy

## 2021-12-11 ENCOUNTER — Encounter (HOSPITAL_BASED_OUTPATIENT_CLINIC_OR_DEPARTMENT_OTHER): Payer: Self-pay | Admitting: Physical Therapy

## 2021-12-11 ENCOUNTER — Ambulatory Visit (HOSPITAL_BASED_OUTPATIENT_CLINIC_OR_DEPARTMENT_OTHER): Payer: Medicare Other | Attending: Physical Medicine and Rehabilitation | Admitting: Physical Therapy

## 2021-12-11 DIAGNOSIS — M546 Pain in thoracic spine: Secondary | ICD-10-CM | POA: Insufficient documentation

## 2021-12-11 DIAGNOSIS — R262 Difficulty in walking, not elsewhere classified: Secondary | ICD-10-CM | POA: Diagnosis present

## 2021-12-11 DIAGNOSIS — M545 Low back pain, unspecified: Secondary | ICD-10-CM | POA: Insufficient documentation

## 2021-12-11 DIAGNOSIS — M6281 Muscle weakness (generalized): Secondary | ICD-10-CM | POA: Diagnosis present

## 2021-12-14 ENCOUNTER — Encounter (HOSPITAL_BASED_OUTPATIENT_CLINIC_OR_DEPARTMENT_OTHER): Payer: Medicare Other | Admitting: Physical Therapy

## 2021-12-17 NOTE — Therapy (Signed)
?OUTPATIENT PHYSICAL THERAPY TREATMENT ? ? ? ?Patient Name: Robert Castro ?MRN: [id number] ?DOB:Mar 04, 1951, 71 y.o., male ?Today's Date: 12/19/2021 ? ? PT End of Session - 12/18/21 1714   ? ? Visit Number 4   ? Number of Visits 19   ? Date for PT Re-Evaluation 02/05/22   ? Authorization Type Medicare A and B   ? Progress Note Due on Visit 13   ? PT Start Time 1610   ? PT Stop Time 1655   ? PT Time Calculation (min) 45 min   ? Activity Tolerance Patient tolerated treatment well   ? Behavior During Therapy [facility] for tasks assessed/performed   ? ?  ?  ? ?  ? ? ? ? ? ?Past Medical History:  ?Diagnosis Date  ? Adenomatous duodenal polyp   ? Arthritis   ? Barrett's esophagus   ? Benign enlargement of prostate   ? Cataract   ? both eyes  ? 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 stenosis and arthritis  ? Depression   ? Diabetes ([place])   ? Diverticulosis   ? ED (erectile dysfunction)   ? Fatty liver   ? GERD (gastroesophageal reflux disease)   ? HOH (hard of hearing)   ? Hyperlipidemia   ? Hypertension   ? Hypothyroidism   ? Internal hemorrhoids   ? Leg edema, left   ? Macular degeneration   ? Neuromuscular disorder ([place])   ? Osteoarthritis   ? Peripheral neuropathy   ? Sleep apnea   ? cpap  ? Testosterone deficiency   ? Tremors of nervous system   ? Umbilical hernia   ? ?Past Surgical History:  ?Procedure Laterality Date  ? BACK SURGERY  2009  ? BACK SURGERY  09/2014  ? Dr Marykay Lex  ? CATARACT EXTRACTION, BILATERAL  12/2016  ? COLONOSCOPY    ? ESOPHAGOGASTRODUODENOSCOPY (EGD) WITH PROPOFOL N/A 09/16/2017  ? Procedure: ESOPHAGOGASTRODUODENOSCOPY (EGD) WITH PROPOFOL;  Surgeon: Yetta Flock, MD;  Location: [facility];  Service: Gastroenterology;  Laterality: N/A;  ? LUMBAR LAMINECTOMY/ DECOMPRESSION WITH MET-RX  2014  ? TONSILLECTOMY  1956  ? UMBILICAL HERNIA REPAIR    ? ?Patient Active Problem List  ? Diagnosis Date Noted  ? Genetic testing 05/01/2018  ? Adenomatous duodenal polyp   ? Diarrhea 05/05/2015  ?  GERD 12/01/2007  ? DYSPHAGIA UNSPECIFIED 12/01/2007  ? INTERNAL HEMORRHOIDS 03/06/2007  ? BARRETTS ESOPHAGUS 01/29/2007  ? DIVERTICULOSIS, COLON 08/01/2005  ? COLONIC POLYPS, ADENOMATOUS 07/09/2002  ? ? ?PCP: Aletha Halim., PA-C ? ?REFERRING PROVIDER: Doran Clay, MD  ? ?REFERRING DIAG:  ? M48.00  (ICD-10-CM) - Spinal stenosis, site unspecified ?          G62.9 (ICD-10-CM) - Polyneuropathy, unspecified ?M19.90 (ICD-10-CM) - Unspecified osteoarthritis, unspecified site ?          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           M62.81 (ICD-10-CM) - Muscle weakness (generalized) ? ?THERAPY DIAG:  ?Pain in thoracic spine ? ?Low back pain, unspecified back pain laterality, unspecified chronicity, unspecified whether sciatica present ? ?Muscle weakness (generalized) ? ?Difficulty in walking, not elsewhere classified ? ?ONSET DATE: October 2022 ? ?SUBJECTIVE:                                                                                                                                                                                          ? ?  SUBJECTIVE STATEMENT: ?-Pt has a hx of 3 prior back surgeries including fusion and decompression.  Pt underwent posterior T3-Iliac Wing revision extension instrumentation and fusion with T10 Laminectomy, pedicle fixation and rods 13 or more vertebral segments, pelvic fixation other than sacrum, and laminectomy, facetectomy, foraminotomy on 1 lumbar segment on 10/03/2021.  PT records in [place] indicated T9-L2 revision extension instrumentation and fusion.  Pt has B/L/T precautions; no bending, lifting, and twisting.  .  ? ?-Pt is 10 weeks and 6 days post op.  He was hospitalized at [facility] from 11/17/2021 - 12/08/2021 with altered mental status, unspecified altered mental status type.   ? ?-Pt reports having increased pain with transfers and ambulation.  He is limited with his ADLs, standing activities, and normal  functional mobility skills including bed mobility, transfers, and ambulation.  He has not been playing music and is unable to swim and perform his workout activities in the fitness center.  Pt states he has not tried the recumbent bike since before last PT Rx.  Pt walked 15 mins a week ago and had increased pain.  He has not done that since.  Pt c/o's of pain in bilat sides and ribs which wraps around to the front and back.  Pt c/o's of spasms.  Pt reports increased pain with bed mobility.  Pt states he has incontinence issues and sees the urologist soon.  ? ?PERTINENT HISTORY:  ?-posterior T3-Iliac Wing revision extension instrumentation and fusion with T10 Laminectomy, pedicle fixation and rods 13 or more vertebral segments, pelvic fixation other than sacrum, and laminectomy, facetectomy, foraminotomy on 1 lumbar segment on 10/03/2021. ?-Hx of 3 prior back surgeries including fusion and decompression; 2016 lumbar fusion ?-Neuropathy, degenerative scoliosis, R foot drop,  Warnicke Korsakov syndrome, Macular degeneration, DM, depression, and anxiety  ? ?PAIN:  ?Are you having pain? Yes ?NPRS scale: 2-3/10 current, 6-9/10 worst, 0/10 best ?Pain location: Mid and lower thoracic and lumbar pain.  Pt denies pain in LE's. ? ? ?PRECAUTIONS: Back and Fall; Pt had fusion and laminectomy.  He has bending, lifting, and twisting precautions.  Falls due to neuropathy.  Pt has R foot drop.  Multiple prior back surgeries. ? ? ?FALLS:  ?Has patient fallen in last 6 months? Yes, Number of falls: 3 ? ?LIVING ENVIRONMENT: ?Lives with: lives alone ?Lives in: 1 level home ?Stairs: 3-4 steps with rail to enter the front ?Has following equipment at home: Single point cane, Walker - 2 wheeled, and [place] - 4 wheeled ? ?OCCUPATION: Pt is retired. ? ?PLOF: Independent; Pt used SPC on occasion especially at night.  He was able to ambulate community distance without significant difficulty. He was careful with stairs.  Pt states he has had falls  since 2019 due to neuropathy.  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  Pt is a member at [employer] and was working out by swimming and using some of the machines in the fitness center.    ? ?PATIENT GOALS wants to be able to travel, improve ambulation, back and LE strength. Pt would like to get in the water when he can.  ? ? ?OBJECTIVE:  ? ?DIAGNOSTIC FINDINGS:  ?X rays, CT scan, and MRI before surgery. ?brain CT and MRI which showed no acute intracranial abnormalities. ? ?TODAY'S TREATMENT   ? ? Therapeutic Exercise: ?  -Reviewed current function, HEP compliance, pain levels, and response to prior Rx.  ?-Educated pt concerning TrA contraction performance and  palpation.  Pt performed TrA contractions. ?-Pt performed: ? Supine marching with TrA 2x10 ? Supine alt LE ext with TrA 2x10 ? LAQ 2x10 bilat ? AP's 2x10 reps bilat  ?-Pt received supine manual HS stretch 2x30 sec bilat ?-Gave pt a HEP handout and educated pt in correct form and appropriate frequency.     ?  -PT answered pt's questions.  ? ?Therapeutic Activity: ?Educated pt with log rolling and pt performed log rolling ? ? ? ?PATIENT EDUCATION:  ?Education details: Gave pt a HEP handout and educated pt in correct form and appropriate frequency.  Exercise form and POC ?Person educated: Patient ?Education method: Explanation, demonstration, verbal and tactile cues, handout ?Education comprehension: verbalized understanding and needs further education, verbal and tactile cues required, returned demonstration ? ? ?HOME EXERCISE PROGRAM: ?Access Code: [email or web address] ?URL: [email or web address] ?Date: 12/18/2021 ?Prepared by: Ronny Flurry ? ?Exercises ?- Supine Transversus Abdominis Bracing - Hands on Stomach  - 3 x daily - 7 x weekly - 1-2 sets - 10 reps ?- Supine March  - 1-2 x daily - 7 x weekly - 2 sets - 10 reps ?- Supine Core Control with Heel Slide  - 1 x daily - 7 x weekly - 2 sets - 10 reps ?-  Seated Long Arc Quad  - 1 x daily - 7 x weekly - 2 sets - 10 reps ?- Seated Ankle Pumps on Table  - 2-3 x daily - 7 x weekly - 2 sets - 10 reps  ? ?ASSESSMENT: ? ?CLINICAL IMPRESSION: ? Pt has increased pain

## 2021-12-18 ENCOUNTER — Encounter (HOSPITAL_BASED_OUTPATIENT_CLINIC_OR_DEPARTMENT_OTHER): Payer: Medicare Other | Admitting: Physical Therapy

## 2021-12-18 ENCOUNTER — Ambulatory Visit (HOSPITAL_BASED_OUTPATIENT_CLINIC_OR_DEPARTMENT_OTHER): Payer: Medicare Other | Admitting: Physical Therapy

## 2021-12-18 DIAGNOSIS — M546 Pain in thoracic spine: Secondary | ICD-10-CM

## 2021-12-18 DIAGNOSIS — R262 Difficulty in walking, not elsewhere classified: Secondary | ICD-10-CM

## 2021-12-18 DIAGNOSIS — M545 Low back pain, unspecified: Secondary | ICD-10-CM

## 2021-12-18 DIAGNOSIS — M6281 Muscle weakness (generalized): Secondary | ICD-10-CM

## 2021-12-19 ENCOUNTER — Ambulatory Visit: Payer: Medicare Other | Admitting: Gastroenterology

## 2021-12-19 ENCOUNTER — Encounter (HOSPITAL_BASED_OUTPATIENT_CLINIC_OR_DEPARTMENT_OTHER): Payer: Self-pay | Admitting: Physical Therapy

## 2021-12-21 ENCOUNTER — Encounter (HOSPITAL_BASED_OUTPATIENT_CLINIC_OR_DEPARTMENT_OTHER): Payer: Medicare Other | Admitting: Physical Therapy

## 2021-12-26 ENCOUNTER — Ambulatory Visit (HOSPITAL_BASED_OUTPATIENT_CLINIC_OR_DEPARTMENT_OTHER): Payer: Medicare Other | Attending: Physical Medicine and Rehabilitation | Admitting: Physical Therapy

## 2021-12-26 DIAGNOSIS — R262 Difficulty in walking, not elsewhere classified: Secondary | ICD-10-CM | POA: Diagnosis present

## 2021-12-26 DIAGNOSIS — M6281 Muscle weakness (generalized): Secondary | ICD-10-CM | POA: Insufficient documentation

## 2021-12-26 DIAGNOSIS — M546 Pain in thoracic spine: Secondary | ICD-10-CM | POA: Insufficient documentation

## 2021-12-26 DIAGNOSIS — M545 Low back pain, unspecified: Secondary | ICD-10-CM | POA: Insufficient documentation

## 2021-12-26 NOTE — Therapy (Signed)
?OUTPATIENT PHYSICAL THERAPY TREATMENT ? ? ? ?Patient Name: Robert Castro ?MRN: [id number] ?DOB:1950/12/14, 71 y.o., male ?Today's Date: 12/27/2021 ? ? PT End of Session - 12/26/21 1534   ? ? Visit Number 5   ? Number of Visits 19   ? Date for PT Re-Evaluation 02/05/22   ? Authorization Type Medicare A and B   ? Progress Note Due on Visit 13   ? PT Start Time 1528   ? PT Stop Time 1601   Rx time limited due to pt arriving late  ? PT Time Calculation (min) 33 min   ? Activity Tolerance Patient tolerated treatment well   ? Behavior During Therapy [facility] for tasks assessed/performed   ? ?  ?  ? ?  ? ? ? ? ? ? ?Past Medical History:  ?Diagnosis Date  ? Adenomatous duodenal polyp   ? Arthritis   ? Barrett's esophagus   ? Benign enlargement of prostate   ? Cataract   ? both eyes  ? 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 stenosis and arthritis  ? Depression   ? Diabetes ([place])   ? Diverticulosis   ? ED (erectile dysfunction)   ? Fatty liver   ? GERD (gastroesophageal reflux disease)   ? HOH (hard of hearing)   ? Hyperlipidemia   ? Hypertension   ? Hypothyroidism   ? Internal hemorrhoids   ? Leg edema, left   ? Macular degeneration   ? Neuromuscular disorder ([place])   ? Osteoarthritis   ? Peripheral neuropathy   ? Sleep apnea   ? cpap  ? Testosterone deficiency   ? Tremors of nervous system   ? Umbilical hernia   ? ?Past Surgical History:  ?Procedure Laterality Date  ? BACK SURGERY  2009  ? BACK SURGERY  09/2014  ? Dr Marykay Lex  ? CATARACT EXTRACTION, BILATERAL  12/2016  ? COLONOSCOPY    ? ESOPHAGOGASTRODUODENOSCOPY (EGD) WITH PROPOFOL N/A 09/16/2017  ? Procedure: ESOPHAGOGASTRODUODENOSCOPY (EGD) WITH PROPOFOL;  Surgeon: Yetta Flock, MD;  Location: [facility];  Service: Gastroenterology;  Laterality: N/A;  ? LUMBAR LAMINECTOMY/ DECOMPRESSION WITH MET-RX  2014  ? TONSILLECTOMY  1956  ? UMBILICAL HERNIA REPAIR    ? ?Patient Active Problem List  ? Diagnosis Date Noted  ? Genetic testing 05/01/2018  ? Adenomatous  duodenal polyp   ? Diarrhea 05/05/2015  ? GERD 12/01/2007  ? DYSPHAGIA UNSPECIFIED 12/01/2007  ? INTERNAL HEMORRHOIDS 03/06/2007  ? BARRETTS ESOPHAGUS 01/29/2007  ? DIVERTICULOSIS, COLON 08/01/2005  ? COLONIC POLYPS, ADENOMATOUS 07/09/2002  ? ? ?PCP: Aletha Halim., PA-C ? ?REFERRING PROVIDER: Doran Clay, MD  ? ?REFERRING DIAG:  ? M48.00  (ICD-10-CM) - Spinal stenosis, site unspecified ?          G62.9 (ICD-10-CM) - Polyneuropathy, unspecified ?M19.90 (ICD-10-CM) - Unspecified osteoarthritis, unspecified site ?          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           M62.81 (ICD-10-CM) - Muscle weakness (generalized) ? ?THERAPY DIAG:  ?Pain in thoracic spine ? ?Low back pain, unspecified back pain laterality, unspecified chronicity, unspecified whether sciatica present ? ?Muscle weakness (generalized) ? ?Difficulty in walking, not elsewhere classified ? ?ONSET DATE: October 2022 ? ?SUBJECTIVE:                                                                                                                                                                                          ? ?  SUBJECTIVE STATEMENT: ?-Pt has a hx of 3 prior back surgeries including fusion and decompression.  Pt underwent posterior T3-Iliac Wing revision extension instrumentation and fusion with T10 Laminectomy, pedicle fixation and rods 13 or more vertebral segments, pelvic fixation other than sacrum, and laminectomy, facetectomy, foraminotomy on 1 lumbar segment on 10/03/2021.  PT records in [place] indicated T9-L2 revision extension instrumentation and fusion.  Pt has B/L/T precautions; no bending, lifting, and twisting. ? ?-Pt is 12 weeks post op.  He was hospitalized at [facility] from 11/17/2021 - 12/08/2021 with altered mental status, unspecified altered mental status type.   ? ?-He is limited with his ADLs, standing activities, and normal functional mobility skills including bed  mobility, transfers, and ambulation.  He has not been playing music and is unable to swim and perform his workout activities in the fitness center.  Pt has spasms.  Pt reports increased pain with bed mobility.  Pt states he has incontinence issues and sees the urologist soon.  Pt states he has PEI which causes him an urgency with his bowels and had issues with that today before Rx.  Pt states his mobility is much better since last Rx.  Pt has been ambulating some outside with rollator.  He walked 35 mins outside with rollator with 1 seated rest break yesterday.  Pt also walked in the grocery store over the weekend.   ? ?PERTINENT HISTORY:  ?-posterior T3-Iliac Wing revision extension instrumentation and fusion with T10 Laminectomy, pedicle fixation and rods 13 or more vertebral segments, pelvic fixation other than sacrum, and laminectomy, facetectomy, foraminotomy on 1 lumbar segment on 10/03/2021. ?-Hx of 3 prior back surgeries including fusion and decompression; 2016 lumbar fusion ?-Neuropathy, degenerative scoliosis, R foot drop,  Warnicke Korsakov syndrome, Macular degeneration, DM, depression, and anxiety  ? ?PAIN:  ?Are you having pain? Yes ?NPRS scale: Pt states he's not really hurting bad today just feels weak all over.  ?Pain location: Mid and lower thoracic and lumbar pain.  Pt denies pain in LE's. ? ? ?PRECAUTIONS: Back and Fall; Pt had fusion and laminectomy.  He has bending, lifting, and twisting precautions.  Falls due to neuropathy.  Pt has R foot drop.  Multiple prior back surgeries. ? ? ?FALLS:  ?Has patient fallen in last 6 months? Yes, Number of falls: 3 ? ?LIVING ENVIRONMENT: ?Lives with: lives alone ?Lives in: 1 level home ?Stairs: 3-4 steps with rail to enter the front ?Has following equipment at home: Single point cane, Walker - 2 wheeled, and [place] - 4 wheeled ? ?OCCUPATION: Pt is retired. ? ?PLOF: Independent; Pt used SPC on occasion especially at night.  He was able to ambulate community  distance without significant difficulty. He was careful with stairs.  Pt states he has had falls since 2019 due to neuropathy.  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  Pt is a member at [employer] and was working out by swimming and using some of the machines in the fitness center.    ? ?PATIENT GOALS wants to be able to travel, improve ambulation, back and LE strength. Pt would like to get in the water when he can.  ? ? ?OBJECTIVE:  ? ?DIAGNOSTIC FINDINGS:  ?X rays, CT scan, and MRI before surgery. ?brain CT and MRI which showed no acute intracranial abnormalities. ? ?TODAY'S TREATMENT   ? ? Therapeutic Exercise: ?  -Reviewed current function, HEP compliance, pain levels, and response to prior Rx.  ?-  Educated pt concerning TrA contraction performance and palpation.  Pt performed TrA contractions with cuing for correct form ?-Pt performed: ? Supine marching with TrA 2x10 ? Supine alt LE ext with TrA 2x10 ? LAQ 2x10 bilat and with AP's for nn flossing 2x10 each ?Supine clams with TrA with RTB x 12 reps and with GTB x 10 reps  ? ?-Gave pt a HEP handout and educated pt in correct form and appropriate frequency.     ?  -PT answered pt's questions.  ? ?Therapeutic Activity: ?Educated pt with log rolling and pt performed log rolling ? ? ? ?PATIENT EDUCATION:  ?Education details: Gave pt a HEP handout and educated pt in correct form and appropriate frequency.  Exercise form and POC ?Person educated: Patient ?Education method: Explanation, demonstration, verbal and tactile cues, handout ?Education comprehension: verbalized understanding and needs further education, verbal and tactile cues required, returned demonstration ? ? ?HOME EXERCISE PROGRAM: ?Access Code: [email or web address] ?URL: [email or web address] ?Date: 12/18/2021 ?Prepared by: Ronny Flurry ? ?Exercises ?- Supine Transversus Abdominis Bracing - Hands on Stomach  - 3 x daily - 7 x weekly - 1-2 sets - 10  reps ?- Supine March  - 1-2 x daily - 7 x weekly - 2 sets - 10 reps ?- Supine Core Control with Heel Slide  - 1 x daily - 7 x weekly - 2 sets - 10 reps ?- Seated Long Arc Quad  - 1 x daily - 7 x weekly - 2 sets

## 2021-12-27 ENCOUNTER — Encounter (HOSPITAL_BASED_OUTPATIENT_CLINIC_OR_DEPARTMENT_OTHER): Payer: Self-pay | Admitting: Physical Therapy

## 2022-01-01 NOTE — Therapy (Incomplete)
?OUTPATIENT PHYSICAL THERAPY TREATMENT ? ? ? ?Patient Name: Robert Castro ?MRN: [id number] ?DOB:Jun 19, 1951, 71 y.o., male ?Today's Date: 01/01/2022 ? ? ? ? ? ? ? ? ?Past Medical History:  ?Diagnosis Date  ? Adenomatous duodenal polyp   ? Arthritis   ? Barrett's esophagus   ? Benign enlargement of prostate   ? Cataract   ? both eyes  ? 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 stenosis and arthritis  ? Depression   ? Diabetes ([place])   ? Diverticulosis   ? ED (erectile dysfunction)   ? Fatty liver   ? GERD (gastroesophageal reflux disease)   ? HOH (hard of hearing)   ? Hyperlipidemia   ? Hypertension   ? Hypothyroidism   ? Internal hemorrhoids   ? Leg edema, left   ? Macular degeneration   ? Neuromuscular disorder ([place])   ? Osteoarthritis   ? Peripheral neuropathy   ? Sleep apnea   ? cpap  ? Testosterone deficiency   ? Tremors of nervous system   ? Umbilical hernia   ? ?Past Surgical History:  ?Procedure Laterality Date  ? BACK SURGERY  2009  ? BACK SURGERY  09/2014  ? Dr Marykay Lex  ? CATARACT EXTRACTION, BILATERAL  12/2016  ? COLONOSCOPY    ? ESOPHAGOGASTRODUODENOSCOPY (EGD) WITH PROPOFOL N/A 09/16/2017  ? Procedure: ESOPHAGOGASTRODUODENOSCOPY (EGD) WITH PROPOFOL;  Surgeon: Yetta Flock, MD;  Location: [facility];  Service: Gastroenterology;  Laterality: N/A;  ? LUMBAR LAMINECTOMY/ DECOMPRESSION WITH MET-RX  2014  ? TONSILLECTOMY  1956  ? UMBILICAL HERNIA REPAIR    ? ?Patient Active Problem List  ? Diagnosis Date Noted  ? Genetic testing 05/01/2018  ? Adenomatous duodenal polyp   ? Diarrhea 05/05/2015  ? GERD 12/01/2007  ? DYSPHAGIA UNSPECIFIED 12/01/2007  ? INTERNAL HEMORRHOIDS 03/06/2007  ? BARRETTS ESOPHAGUS 01/29/2007  ? DIVERTICULOSIS, COLON 08/01/2005  ? COLONIC POLYPS, ADENOMATOUS 07/09/2002  ? ? ?PCP: Aletha Halim., PA-C ? ?REFERRING PROVIDER: Doran Clay, MD  ? ?REFERRING DIAG:  ? M48.00  (ICD-10-CM) - Spinal stenosis, site unspecified ?          G62.9 (ICD-10-CM) - Polyneuropathy,  unspecified ?M19.90 (ICD-10-CM) - Unspecified osteoarthritis, unspecified site ?          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           M62.81 (ICD-10-CM) - Muscle weakness (generalized) ? ?THERAPY DIAG:  ?No diagnosis found. ? ?ONSET DATE: October 2022 ? ?SUBJECTIVE:                                                                                                                                                                                          ? ?  SUBJECTIVE STATEMENT: ?-Pt has a hx of 3 prior back surgeries including fusion and decompression.  Pt underwent posterior T3-Iliac Wing revision extension instrumentation and fusion with T10 Laminectomy, pedicle fixation and rods 13 or more vertebral segments, pelvic fixation other than sacrum, and laminectomy, facetectomy, foraminotomy on 1 lumbar segment on 10/03/2021.  PT records in [place] indicated T9-L2 revision extension instrumentation and fusion.  Pt has B/L/T precautions; no bending, lifting, and twisting. ? ?-Pt is 13 weeks post op.  He was hospitalized at [facility] from 11/17/2021 - 12/08/2021 with altered mental status, unspecified altered mental status type.   ? ?-He is limited with his ADLs, standing activities, and normal functional mobility skills including bed mobility, transfers, and ambulation.  He has not been playing music and is unable to swim and perform his workout activities in the fitness center.  Pt has spasms.  Pt reports increased pain with bed mobility.  Pt states he has incontinence issues and sees the urologist soon.  Pt states he has PEI which causes him an urgency with his bowels and had issues with that today before Rx.  Pt states his mobility is much better since last Rx.  Pt has been ambulating some outside with rollator.  He walked 35 mins outside with rollator with 1 seated rest break yesterday.  Pt also walked in the grocery store over the weekend.   ? ?PERTINENT HISTORY:   ?-posterior T3-Iliac Wing revision extension instrumentation and fusion with T10 Laminectomy, pedicle fixation and rods 13 or more vertebral segments, pelvic fixation other than sacrum, and laminectomy, facetectomy, foraminotomy on 1 lumbar segment on 10/03/2021. ?-Hx of 3 prior back surgeries including fusion and decompression; 2016 lumbar fusion ?-Neuropathy, degenerative scoliosis, R foot drop,  Warnicke Korsakov syndrome, Macular degeneration, DM, depression, and anxiety  ? ?PAIN:  ?Are you having pain? Yes ?NPRS scale: Pt states he's not really hurting bad today just feels weak all over.  ?Pain location: Mid and lower thoracic and lumbar pain.  Pt denies pain in LE's. ? ? ?PRECAUTIONS: Back and Fall; Pt had fusion and laminectomy.  He has bending, lifting, and twisting precautions.  Falls due to neuropathy.  Pt has R foot drop.  Multiple prior back surgeries. ? ? ?FALLS:  ?Has patient fallen in last 6 months? Yes, Number of falls: 3 ? ?LIVING ENVIRONMENT: ?Lives with: lives alone ?Lives in: 1 level home ?Stairs: 3-4 steps with rail to enter the front ?Has following equipment at home: Single point cane, Walker - 2 wheeled, and [place] - 4 wheeled ? ?OCCUPATION: Pt is retired. ? ?PLOF: Independent; Pt used SPC on occasion especially at night.  He was able to ambulate community distance without significant difficulty. He was careful with stairs.  Pt states he has had falls since 2019 due to neuropathy.  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  Pt is a member at [employer] and was working out by swimming and using some of the machines in the fitness center.    ? ?PATIENT GOALS wants to be able to travel, improve ambulation, back and LE strength. Pt would like to get in the water when he can.  ? ? ?OBJECTIVE:  ? ?DIAGNOSTIC FINDINGS:  ?X rays, CT scan, and MRI before surgery. ?brain CT and MRI which showed no acute intracranial abnormalities. ? ?TODAY'S TREATMENT    ? ? Therapeutic Exercise: ?  -Reviewed current function, HEP compliance, pain levels, and response to prior Rx.  ?-  Educated pt concerning TrA contraction performance and palpation.  Pt performed TrA contractions with cuing for correct form ?-Pt performed: ? Supine marching with TrA 2x10 ? Supine alt LE ext with TrA 2x10 ? LAQ 2x10 bilat and with AP's for nn flossing 2x10 each ?Supine clams with TrA with RTB x 12 reps and with GTB x 10 reps  ? ?-Gave pt a HEP handout and educated pt in correct form and appropriate frequency.     ?  -PT answered pt's questions.  ? ?Therapeutic Activity: ?Educated pt with log rolling and pt performed log rolling ? ? ? ?PATIENT EDUCATION:  ?Education details: Gave pt a HEP handout and educated pt in correct form and appropriate frequency.  Exercise form and POC ?Person educated: Patient ?Education method: Explanation, demonstration, verbal and tactile cues, handout ?Education comprehension: verbalized understanding and needs further education, verbal and tactile cues required, returned demonstration ? ? ?HOME EXERCISE PROGRAM: ?Access Code: [email or web address] ?URL: [email or web address] ?Date: 12/18/2021 ?Prepared by: Ronny Flurry ? ?Exercises ?- Supine Transversus Abdominis Bracing - Hands on Stomach  - 3 x daily - 7 x weekly - 1-2 sets - 10 reps ?- Supine March  - 1-2 x daily - 7 x weekly - 2 sets - 10 reps ?- Supine Core Control with Heel Slide  - 1 x daily - 7 x weekly - 2 sets - 10 reps ?- Seated Long Arc Quad  - 1 x daily - 7 x weekly - 2 sets - 10 reps ?- Seated Ankle Pumps on Table  - 2-3 x daily - 7 x weekly - 2 sets - 10 reps  ? ?Updated HEP: ?- Hooklying Clamshell with Resistance  - 1 x daily - 4-5 x weekly - 2 sets - 10 reps ? ?ASSESSMENT: ? ?CLINICAL IMPRESSION: ? Rx time limited due to pt arriving late to Rx.  Pt presents to Rx reporting improved mobility.  He has increased his ambulation distance.  Pt required cuing for correct form with log roll and demonstrated  improved form with cuing.  Pt had much improved ease and less pain with bed mobility.  He still required time to lie supine to let things settle before starting his exercises.  Pt has difficulty with performin

## 2022-01-02 ENCOUNTER — Ambulatory Visit (HOSPITAL_BASED_OUTPATIENT_CLINIC_OR_DEPARTMENT_OTHER): Payer: Medicare Other | Admitting: Physical Therapy

## 2022-01-09 ENCOUNTER — Ambulatory Visit (HOSPITAL_BASED_OUTPATIENT_CLINIC_OR_DEPARTMENT_OTHER): Payer: Medicare Other | Admitting: Physical Therapy

## 2022-01-09 DIAGNOSIS — R262 Difficulty in walking, not elsewhere classified: Secondary | ICD-10-CM

## 2022-01-09 DIAGNOSIS — M6281 Muscle weakness (generalized): Secondary | ICD-10-CM

## 2022-01-09 DIAGNOSIS — M546 Pain in thoracic spine: Secondary | ICD-10-CM

## 2022-01-09 DIAGNOSIS — M545 Low back pain, unspecified: Secondary | ICD-10-CM

## 2022-01-09 NOTE — Therapy (Signed)
?OUTPATIENT PHYSICAL THERAPY TREATMENT ? ? ? ?Patient Name: Robert Castro ?MRN: [id number] ?DOB:[birth year], 71 y.o., male ?Today's Date: 01/10/2022 ? ? PT End of Session - 01/09/22 1653   ? ? Visit Number 6   ? Number of Visits 19   ? Date for PT Re-Evaluation 02/05/22   ? Authorization Type Medicare A and B   ? Progress Note Due on Visit 13   ? PT Start Time 1610   ? PT Stop Time 4098   ? PT Time Calculation (min) 41 min   ? Activity Tolerance Patient tolerated treatment well   ? Behavior During Therapy [facility] for tasks assessed/performed   ? ?  ?  ? ?  ? ? ? ? ? ? ? ?Past Medical History:  ?Diagnosis Date  ? Adenomatous duodenal polyp   ? Arthritis   ? Barrett's esophagus   ? Benign enlargement of prostate   ? Cataract   ? both eyes  ? 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 stenosis and arthritis  ? Depression   ? Diabetes ([place])   ? Diverticulosis   ? ED (erectile dysfunction)   ? Fatty liver   ? GERD (gastroesophageal reflux disease)   ? HOH (hard of hearing)   ? Hyperlipidemia   ? Hypertension   ? Hypothyroidism   ? Internal hemorrhoids   ? Leg edema, left   ? Macular degeneration   ? Neuromuscular disorder ([place])   ? Osteoarthritis   ? Peripheral neuropathy   ? Sleep apnea   ? cpap  ? Testosterone deficiency   ? Tremors of nervous system   ? Umbilical hernia   ? ?Past Surgical History:  ?Procedure Laterality Date  ? BACK SURGERY  2009  ? BACK SURGERY  09/2014  ? Dr Marykay Lex  ? CATARACT EXTRACTION, BILATERAL  12/2016  ? COLONOSCOPY    ? ESOPHAGOGASTRODUODENOSCOPY (EGD) WITH PROPOFOL N/A 09/16/2017  ? Procedure: ESOPHAGOGASTRODUODENOSCOPY (EGD) WITH PROPOFOL;  Surgeon: Yetta Flock, MD;  Location: [facility];  Service: Gastroenterology;  Laterality: N/A;  ? LUMBAR LAMINECTOMY/ DECOMPRESSION WITH MET-RX  2014  ? TONSILLECTOMY  1956  ? UMBILICAL HERNIA REPAIR    ? ?Patient Active Problem List  ? Diagnosis Date Noted  ? Genetic testing 05/01/2018  ? Adenomatous duodenal polyp   ? Diarrhea 05/05/2015  ?  GERD 12/01/2007  ? DYSPHAGIA UNSPECIFIED 12/01/2007  ? INTERNAL HEMORRHOIDS 03/06/2007  ? BARRETTS ESOPHAGUS 01/29/2007  ? DIVERTICULOSIS, COLON 08/01/2005  ? COLONIC POLYPS, ADENOMATOUS 07/09/2002  ? ? ?PCP: Aletha Halim., PA-C ? ?REFERRING PROVIDER: Doran Clay, MD  ? ?REFERRING DIAG:  ? M48.00  (ICD-10-CM) - Spinal stenosis, site unspecified ?          G62.9 (ICD-10-CM) - Polyneuropathy, unspecified ?M19.90 (ICD-10-CM) - Unspecified osteoarthritis, unspecified site ?          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           M62.81 (ICD-10-CM) - Muscle weakness (generalized) ? ?THERAPY DIAG:  ?Pain in thoracic spine ? ?Low back pain, unspecified back pain laterality, unspecified chronicity, unspecified whether sciatica present ? ?Muscle weakness (generalized) ? ?Difficulty in walking, not elsewhere classified ? ?ONSET DATE: October 2022 ? ?SUBJECTIVE:                                                                                                                                                                                          ? ?  SUBJECTIVE STATEMENT: ?-Pt has a hx of 3 prior back surgeries including fusion and decompression.  Pt underwent posterior T3-Iliac Wing revision extension instrumentation and fusion with T10 Laminectomy, pedicle fixation and rods 13 or more vertebral segments, pelvic fixation other than sacrum, and laminectomy, facetectomy, foraminotomy on 1 lumbar segment on 10/03/2021.  PT records in [place] indicated T9-L2 revision extension instrumentation and fusion.  Pt has B/L/T precautions; no bending, lifting, and twisting. ? ?-Pt is 14 weeks post op.  He was hospitalized at [facility] from 11/17/2021 - 12/08/2021 with altered mental status, unspecified altered mental status type.   ? ?-Pt reports he has had a "remarkable improvement over the past few days".  He reports improved mobility including bed mobility and performance of  transfers including getting off the couch and getting into the car.  Pt also reports improved ambulation and standing tolerance.  Pt reports he has not been compliant with HEP.   ? ?PERTINENT HISTORY:  ?-posterior T3-Iliac Wing revision extension instrumentation and fusion with T10 Laminectomy, pedicle fixation and rods 13 or more vertebral segments, pelvic fixation other than sacrum, and laminectomy, facetectomy, foraminotomy on 1 lumbar segment on 10/03/2021. ?-Hx of 3 prior back surgeries including fusion and decompression; 2016 lumbar fusion ?-Neuropathy, degenerative scoliosis, R foot drop,  Warnicke Korsakov syndrome, Macular degeneration, DM, depression, and anxiety  ? ?PAIN:  ?Are you having pain? No pain at rest ?NPRS scale: 0/10 ?Pain location:  Pt reports his rib pain is not gone, but is 75% better.  Pt denies pain in LE's. ? ? ?PRECAUTIONS: Back and Fall; Pt had fusion and laminectomy.  He has bending, lifting, and twisting precautions.  Falls due to neuropathy.  Pt has R foot drop.  Multiple prior back surgeries. ? ? ?FALLS:  ?Has patient fallen in last 6 months? Yes, Number of falls: 3 ? ?LIVING ENVIRONMENT: ?Lives with: lives alone ?Lives in: 1 level home ?Stairs: 3-4 steps with rail to enter the front ?Has following equipment at home: Single point cane, Walker - 2 wheeled, and [place] - 4 wheeled ? ?OCCUPATION: Pt is retired. ? ?PLOF: Independent; Pt used SPC on occasion especially at night.  He was able to ambulate community distance without significant difficulty. He was careful with stairs.  Pt states he has had falls since 2019 due to neuropathy.  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  Pt is a member at [employer] and was working out by swimming and using some of the machines in the fitness center.    ? ?PATIENT GOALS wants to be able to travel, improve ambulation, back and LE strength. Pt would like to get in the water when he can.   ? ? ?OBJECTIVE:  ? ?DIAGNOSTIC FINDINGS:  ?X rays, CT scan, and MRI before surgery. ?brain CT and MRI which showed no acute intracranial abnormalities. ? ?TODAY'S TREATMENT   ? ? Therapeutic Exercise: ?  -Reviewed current function, HEP compliance, pain levels, and response to prior Rx.  ?-Educated pt concerning TrA contraction performance and palpation.  Pt performed TrA contractions with cuing for correct form ?-Pt performed: ? Supine marching with TrA 2x10 ? Supine alt LE ext with TrA 2x10 ? Supine alt UE/LE with TrA 2x10 reps ? Supine clams with with GTB 2 x 10-12 reps ? Supine SLR x 10 reps bilat ? Supine manual HS stretch 2x20 sec bilat ? LAQ 2x10 bilat with GTB ?  LAQ with AP's for nn flossing 2x10 each ? ?Neuro Re-ed Activities:  (for improved postural stability, balance, and functional stability) ?Standing rows with TrA with scap retraction with RTB 2x10 reps ?Standing shoulder ext with TrA with scap retraction with RTB 2x10 reps  ?Standing with FA on airex 2x40 sec and with FT x30 sec with SBA/CGA ?Standing with FA with EC on floor 2x20 sec with CGA ? ?  -PT answered pt's questions.  ? ?Therapeutic Activity: ?Educated pt with log rolling and pt performed log rolling ? ? ? ?PATIENT EDUCATION:  ?Education details:  Encouraged pt to be compliant with HEP.  Exercise rationale, log rolling, exercise form and POC ?Person educated: Patient ?Education method: Explanation, demonstration, verbal and tactile cues,  ?Education comprehension: verbalized understanding and needs further education, verbal and tactile cues required, returned demonstration ? ? ?HOME EXERCISE PROGRAM: ?Access Code: [email or web address] ?URL: [email or web address] ?Date: 12/18/2021 ?Prepared by: Ronny Flurry ? ?Exercises ?- Supine Transversus Abdominis Bracing - Hands on Stomach  - 3 x daily - 7 x weekly - 1-2 sets - 10 reps ?- Supine March  - 1-2 x daily - 7 x weekly - 2 sets - 10 reps ?- Supine Core Control with Heel Slide  - 1 x  daily - 7 x weekly - 2 sets - 10 reps ?- Seated Long Arc Quad  - 1 x daily - 7 x weekly - 2 sets - 10 reps ?- Seated Ankle Pumps on Table  - 2-3 x daily - 7 x weekly - 2 sets - 10 reps  ? ? ? ?ASSESSMENT: ? ?CLINI

## 2022-01-10 ENCOUNTER — Encounter (HOSPITAL_BASED_OUTPATIENT_CLINIC_OR_DEPARTMENT_OTHER): Payer: Self-pay | Admitting: Physical Therapy

## 2022-01-11 ENCOUNTER — Ambulatory Visit (HOSPITAL_BASED_OUTPATIENT_CLINIC_OR_DEPARTMENT_OTHER): Payer: Medicare Other | Admitting: Physical Therapy

## 2022-01-11 DIAGNOSIS — R262 Difficulty in walking, not elsewhere classified: Secondary | ICD-10-CM

## 2022-01-11 DIAGNOSIS — M546 Pain in thoracic spine: Secondary | ICD-10-CM

## 2022-01-11 DIAGNOSIS — M6281 Muscle weakness (generalized): Secondary | ICD-10-CM

## 2022-01-11 DIAGNOSIS — M545 Low back pain, unspecified: Secondary | ICD-10-CM

## 2022-01-11 NOTE — Therapy (Signed)
OUTPATIENT PHYSICAL THERAPY TREATMENT    Patient Name: Robert Castro MRN: [id number] DOB:1951-05-17, 71 y.o., male Today's Date: 01/12/2022   PT End of Session - 01/11/22 1536     Visit Number 7    Number of Visits 19    Date for PT Re-Evaluation 02/05/22    Authorization Type Medicare A and B    Progress Note Due on Visit 13    PT Start Time 1529    PT Stop Time 1608    PT Time Calculation (min) 39 min    Activity Tolerance Patient tolerated treatment well    Behavior During Therapy WFL for tasks assessed/performed                   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    stenosis and arthritis   Depression    Diabetes ([place])    Diverticulosis    ED (erectile dysfunction)    Fatty liver    GERD (gastroesophageal reflux disease)    HOH (hard of hearing)    Hyperlipidemia    Hypertension    Hypothyroidism    Internal hemorrhoids    Leg edema, left    Macular degeneration    Neuromuscular disorder ([facility])    Osteoarthritis    Peripheral neuropathy    Sleep apnea    cpap   Testosterone deficiency    Tremors of nervous system    Umbilical hernia    Past Surgical History:  Procedure Laterality Date   BACK SURGERY  2009   BACK SURGERY  09/2014   Dr Marykay Lex   CATARACT EXTRACTION, BILATERAL  12/2016   COLONOSCOPY     ESOPHAGOGASTRODUODENOSCOPY (EGD) WITH PROPOFOL N/A 09/16/2017   Procedure: ESOPHAGOGASTRODUODENOSCOPY (EGD) WITH PROPOFOL;  Surgeon: Yetta Flock, MD;  Location: [facility];  Service: Gastroenterology;  Laterality: N/A;   LUMBAR LAMINECTOMY/ DECOMPRESSION WITH MET-RX  2014   TONSILLECTOMY  5993   UMBILICAL HERNIA REPAIR     Patient Active Problem List   Diagnosis Date Noted   Genetic testing 05/01/2018   Adenomatous duodenal polyp    Diarrhea 05/05/2015    GERD 12/01/2007   DYSPHAGIA UNSPECIFIED 12/01/2007   INTERNAL HEMORRHOIDS 03/06/2007   BARRETTS ESOPHAGUS 01/29/2007   DIVERTICULOSIS, COLON 08/01/2005   COLONIC POLYPS, ADENOMATOUS 07/09/2002    PCP: Aletha Halim., PA-C  REFERRING PROVIDER: Doran Clay, MD   REFERRING DIAG:   M48.00  (ICD-10-CM) - Spinal stenosis, site unspecified           G62.9 (ICD-10-CM) - Polyneuropathy, unspecified M19.90 (ICD-10-CM) - Unspecified osteoarthritis, unspecified site           M48.061 (ICD-10-CM) - Spinal stenosis, lumbar 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  Pt underwent posterior T3-Iliac Wing revision extension instrumentation and fusion with T10 Laminectomy, pedicle fixation and rods 13 or more vertebral segments, pelvic fixation other than sacrum, and laminectomy, facetectomy, foraminotomy on 1 lumbar segment on 10/03/2021.  PT records in [place] indicated T9-L2 revision extension instrumentation and fusion.  Pt has B/L/T precautions; no bending, lifting, and twisting.  -Pt is 14 weeks and 2 days post op.  He was hospitalized at [facility] from 11/17/2021 - 12/08/2021 with altered mental status, unspecified altered mental status type.    -Pt states he is getting better everyday.  He reports improved mobility including bed mobility and performance of transfers  including getting off the couch and getting into the car.  Pt also reports improved ambulation and standing tolerance.  Pt reports he has not been compliant with HEP.  Pt states he felt good after prior Rx.    PERTINENT HISTORY:  -posterior T3-Iliac Wing revision extension instrumentation and fusion with T10 Laminectomy, pedicle fixation and rods 13 or more vertebral segments, pelvic fixation other than sacrum, and laminectomy, facetectomy, foraminotomy on 1 lumbar segment on 10/03/2021. -Hx of 3 prior back surgeries including fusion and decompression; 2016 lumbar fusion -Neuropathy, degenerative scoliosis, R foot drop,  Warnicke Korsakov syndrome, Macular degeneration, DM, depression, and anxiety   PAIN:  NPRS scale: "barely having any pain" Pain location:  Pt reports his rib pain is not gone, but is 75% better.  Pt denies pain in LE's.   PRECAUTIONS: Back and Fall; Pt had fusion and laminectomy.  He has bending, lifting, and twisting precautions.  Falls due to neuropathy.  Pt has R foot drop.  Multiple prior back surgeries.   FALLS:  Has patient fallen in last 6 months? Yes, Number of falls: 3  LIVING ENVIRONMENT: Lives with: lives alone Lives in: 1 level home Stairs: 3-4 steps with rail to enter the front Has following equipment at home: Single point cane, Walker - 2 wheeled, and Environmental consultant - 4 wheeled  OCCUPATION: Pt is retired.  PLOF: Independent; Pt used SPC on occasion especially at night.  He was able to ambulate community distance without significant difficulty. He was careful with stairs.  Pt states he has had falls since 2019 due to neuropathy.  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  Pt is a member at [employer] and was working out by swimming and using some of the machines in the fitness center.     PATIENT GOALS wants to be able to travel, improve ambulation, back and LE strength. Pt would like to get in the water when he can.     OBJECTIVE:   DIAGNOSTIC FINDINGS:  X rays, CT scan, and MRI before surgery. brain CT and MRI which showed no acute intracranial abnormalities.  TODAY'S TREATMENT     Therapeutic Exercise:   -Reviewed current function, HEP compliance, pain levels, and response to prior Rx.  -Educated pt concerning TrA contraction performance and palpation.  Pt performed TrA contractions with cuing for correct form -Reviewed HEP -Pt performed:  Supine alt LE ext with TrA 2x10  Supine alt UE/LE with TrA 2x10 reps  Supine clams with with GTB 2 x 10-12 reps  Supine SLR x 10 reps bilat  Supine manual HS stretch 2x20 sec bilat  LAQ 2x10 bilat with GTB          LAQ with  AP's for nn flossing 2x10 each  Neuro Re-ed Activities:  (for improved postural stability, balance, and functional stability) Standing rows with TrA with scap retraction with RTB 2x10 reps Standing shoulder ext with TrA with scap retraction with RTB 2x10 reps  Standing with FT on airex  with FT 3x30 sec with SBA/CGA Standing with FA with EC on floor 2x20 sec with CGA    -PT answered pt's questions.    PATIENT EDUCATION:  Education details:  Encouraged pt to be compliant with HEP.  Exercise rationale, log rolling, exercise form and POC Person educated: Patient Education method: Explanation, demonstration, verbal and tactile cues,  Education comprehension: verbalized understanding and needs further education, verbal and tactile cues required, returned demonstration   HOME EXERCISE PROGRAM: Access Code: [email or web address] URL: [email or web address] Date: 12/18/2021 Prepared by: Ronny Flurry  Exercises - Supine Transversus Abdominis Bracing - Hands on Stomach  - 3 x daily - 7 x weekly - 1-2 sets - 10 reps - Supine March  - 1-2 x daily - 7 x weekly - 2 sets - 10 reps - Supine Core Control with Heel Slide  - 1 x daily - 7 x weekly - 2 sets - 10 reps - Seated Long Arc Quad  - 1 x daily - 7 x weekly - 2 sets - 10 reps -  Seated Ankle Pumps on Table  - 2-3 x daily - 7 x weekly - 2 sets - 10 reps     ASSESSMENT:  CLINICAL IMPRESSION: Pt is making good progress with functional mobility as evidenced by subjective reports and visual observation of gait and bed mobility.  He demonstrates much improved form, speed, and ease with log rolling.  Pt performed exercises well with cuing and instruction on correct form.  Pt performed exercises per protocol well with cuing for correct form.  Pt is weaker on R than L with supine SLR having an extensor lag on R.  Pt is not compliant with HEP and PT encouraged pt to perform HEP.  Pt responded well to Rx having no increased pain after Rx.  Pt should benefit from skilled PT services per protocol to address impairments and goals and improve overall function.          OBJECTIVE IMPAIRMENTS Abnormal gait, decreased activity tolerance, decreased balance, decreased endurance, decreased mobility, difficulty walking, decreased ROM, decreased strength, hypomobility, impaired flexibility, postural dysfunction, and pain.   ACTIVITY LIMITATIONS cleaning, community activity, laundry, shopping, and ambulation .   PERSONAL FACTORS 3+ comorbidities: Hx of prior back surgeries including lumbar fusion, Neuropathy, R foot drop,  Macular degeneration, and DM   are also affecting patient's functional outcome.    REHAB POTENTIAL: Good  CLINICAL DECISION MAKING: Stable/uncomplicated  EVALUATION COMPLEXITY: Low   GOALS:  SHORT TERM GOALS:  STG Name Target Date Goal status  1 Pt will be independent and compliant with HEP for improved pain, strength, mobility, and function.  Baseline:  02/02/2022  INITIAL  2 Pt will demo improved gait speed, posture, and bilat step length with gait for improved community ambulation.  Baseline:  02/09/2022 INITIAL  3 Pt will be able to perform his normal daily transfers without difficulty.   Baseline: 01/08/2022 INITIAL  4 Pt will progress his walking program  without adverse effects for improved tolerance with activity and ambulation.   Baseline: 02/09/2022  INITIAL  5 Pt will demo good form with a log roll for improved bed mobility Baseline: 02/02/2022 INITIAL  LONG TERM GOALS:   LTG Name Target Date Goal status  1 Pt will be able to ambulate extended community distance with LRAD without significant difficulty and pain.  Baseline: 02/05/2022 INITIAL  2 Pt will be able to play music without significant pain.  Baseline: 02/05/2022 INITIAL  3 Pt will demo improved core strength by progressing with core exercises per protocol without adverse effects for improved tolerance to and performance of daily activities and functional mobility.  Baseline: 02/05/2022 INITIAL  4 Pt will be able to his perform normal ADLs, standing activities and appropriate household chores without significant pain or difficulty.  Baseline: 02/05/2022 INITIAL  5 Pt will be able to ambulate in grocery store and shop without significant pain.  Baseline: 02/05/2022 INITIAL             PLAN: PT FREQUENCY: 2 times per week  PT DURATION: 8 weeks  PLANNED INTERVENTIONS: Therapeutic exercises, Therapeutic activity, Neuromuscular re-education, Balance training, Gait training, Patient/Family education, Joint mobilization, Stair training, DME instructions, Aquatic Therapy, Dry Needling, Electrical stimulation, Cryotherapy, Moist heat, Taping, and Manual therapy  PLAN FOR NEXT SESSION:  cont with log rolling.  Cont with core and LE strengthening per fusion protocol.  Review current HEP.      Selinda Michaels III PT, DPT 01/12/22 5:18 PM

## 2022-01-12 ENCOUNTER — Encounter (HOSPITAL_BASED_OUTPATIENT_CLINIC_OR_DEPARTMENT_OTHER): Payer: Self-pay | Admitting: Physical Therapy

## 2022-01-16 ENCOUNTER — Ambulatory Visit (HOSPITAL_BASED_OUTPATIENT_CLINIC_OR_DEPARTMENT_OTHER): Payer: Medicare Other | Admitting: Physical Therapy

## 2022-01-16 DIAGNOSIS — M546 Pain in thoracic spine: Secondary | ICD-10-CM | POA: Diagnosis not present

## 2022-01-16 DIAGNOSIS — M545 Low back pain, unspecified: Secondary | ICD-10-CM

## 2022-01-16 DIAGNOSIS — M6281 Muscle weakness (generalized): Secondary | ICD-10-CM

## 2022-01-16 DIAGNOSIS — R262 Difficulty in walking, not elsewhere classified: Secondary | ICD-10-CM

## 2022-01-16 NOTE — Therapy (Signed)
OUTPATIENT PHYSICAL THERAPY TREATMENT    Patient Name: Robert Castro MRN: [id number] DOB:Jul 05, 1951, 71 y.o., male Today's Date: 01/17/2022   PT End of Session - 01/16/22 1628     Visit Number 8    Number of Visits 19    Date for PT Re-Evaluation 02/05/22    Authorization Type Medicare A and B    PT Start Time 1615    PT Stop Time 1700    PT Time Calculation (min) 45 min    Activity Tolerance Patient tolerated treatment well    Behavior During Therapy WFL for tasks assessed/performed                   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    stenosis and arthritis   Depression    Diabetes ([place])    Diverticulosis    ED (erectile dysfunction)    Fatty liver    GERD (gastroesophageal reflux disease)    HOH (hard of hearing)    Hyperlipidemia    Hypertension    Hypothyroidism    Internal hemorrhoids    Leg edema, left    Macular degeneration    Neuromuscular disorder (HCC)    Osteoarthritis    Peripheral neuropathy    Sleep apnea    cpap   Testosterone deficiency    Tremors of nervous system    Umbilical hernia    Past Surgical History:  Procedure Laterality Date   BACK SURGERY  2009   BACK SURGERY  09/2014   Dr Marykay Lex   CATARACT EXTRACTION, BILATERAL  12/2016   COLONOSCOPY     ESOPHAGOGASTRODUODENOSCOPY (EGD) WITH PROPOFOL N/A 09/16/2017   Procedure: ESOPHAGOGASTRODUODENOSCOPY (EGD) WITH PROPOFOL;  Surgeon: Yetta Flock, MD;  Location: [facility];  Service: Gastroenterology;  Laterality: N/A;   LUMBAR LAMINECTOMY/ DECOMPRESSION WITH MET-RX  2014   TONSILLECTOMY  2010   UMBILICAL HERNIA REPAIR     Patient Active Problem List   Diagnosis Date Noted   Genetic testing 05/01/2018   Adenomatous duodenal polyp    Diarrhea 05/05/2015   GERD 12/01/2007   DYSPHAGIA  UNSPECIFIED 12/01/2007   INTERNAL HEMORRHOIDS 03/06/2007   BARRETTS ESOPHAGUS 01/29/2007   DIVERTICULOSIS, COLON 08/01/2005   COLONIC POLYPS, ADENOMATOUS 07/09/2002    PCP: Aletha Halim., PA-C  REFERRING PROVIDER: Doran Clay, MD   REFERRING DIAG:   M48.00  (ICD-10-CM) - Spinal stenosis, site unspecified           G62.9 (ICD-10-CM) - Polyneuropathy, unspecified M19.90 (ICD-10-CM) - Unspecified osteoarthritis, unspecified site           M48.061 (ICD-10-CM) - Spinal stenosis, lumbar 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  Pt underwent posterior T3-Iliac Wing revision extension instrumentation and fusion with T10 Laminectomy, pedicle fixation and rods 13 or more vertebral segments, pelvic fixation other than sacrum, and laminectomy, facetectomy, foraminotomy on 1 lumbar segment on 10/03/2021.  PT records in [place] indicated T9-L2 revision extension instrumentation and fusion.  Pt has B/L/T precautions; no bending, lifting, and twisting.  -Pt is 15 weeks post op.  He was hospitalized at [facility] from 11/17/2021 - 12/08/2021 with altered mental status, unspecified altered mental status type.    -Pt states he is getting better everyday.  He reports improved mobility including bed mobility and performance of transfers including getting off the couch and getting into  the car.  Pt also reports improved ambulation and standing tolerance.  Pt states he felt good after prior Rx.  Pt reports he has been compliant with HEP.  He states he has had no increased pain with HEP.      PERTINENT HISTORY:  -posterior T3-Iliac Wing revision extension instrumentation and fusion with T10 Laminectomy, pedicle fixation and rods 13 or more vertebral segments, pelvic fixation other than sacrum, and laminectomy, facetectomy, foraminotomy on 1 lumbar segment on 10/03/2021. -Hx of 3 prior back surgeries including fusion and decompression; 2016 lumbar fusion -Neuropathy, degenerative scoliosis, R foot drop,  Warnicke Korsakov syndrome, Macular degeneration, DM, depression, and anxiety   PAIN:  NPRS scale: "barely having any pain" Pain location:  Pt reports his rib pain is not gone, but is 75% better.  Pt denies pain in LE's.   PRECAUTIONS: Back and Fall; Pt had fusion and laminectomy.  He has bending, lifting, and twisting precautions.  Falls due to neuropathy.  Pt has R foot drop.  Multiple prior back surgeries.   FALLS:  Has patient fallen in last 6 months? Yes, Number of falls: 3  LIVING ENVIRONMENT: Lives with: lives alone Lives in: 1 level home Stairs: 3-4 steps with rail to enter the front Has following equipment at home: Single point cane, Walker - 2 wheeled, and Environmental consultant - 4 wheeled  OCCUPATION: Pt is retired.  PLOF: Independent; Pt used SPC on occasion especially at night.  He was able to ambulate community distance without significant difficulty. He was careful with stairs.  Pt states he has had falls since 2019 due to neuropathy.  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  Pt is a member at [employer] and was working out by swimming and using some of the machines in the fitness center.     PATIENT GOALS wants to be able to travel, improve ambulation, back and LE strength. Pt would like to get in the water when he can.     OBJECTIVE:   DIAGNOSTIC FINDINGS:  X rays, CT scan, and MRI before surgery. brain CT and MRI which showed no acute intracranial abnormalities.  TODAY'S TREATMENT     Therapeutic Exercise:   -Reviewed current function, HEP compliance, pain levels, and response to prior Rx.  -Educated pt concerning TrA contraction performance and palpation.  Pt performed TrA contractions with cuing and instruction for correct form -Reviewed HEP -Pt performed:  Supine alt LE ext with TrA while holding ball at 90 deg flexion 2x10  Supine alt UE/LE with TrA 2x10 reps  Supine clams with with GTB 2 x 12 reps  Supine SLR x 10 reps bilat  Supine manual HS stretch 2x20 sec  bilat  Supine HS stretch with strap x20 sec bilat  S/L clams 2x10 reps  LAQ 2x10 bilat with GTB          LAQ with AP's for nn flossing 2x10 each Standing rows with TrA with scap retraction with RTB 2x10 reps Standing shoulder ext with TrA with scap retraction with RTB 2x10 reps     -PT answered pt's questions.    Therapeutic Activities:  Educated pt with log rolling.   PATIENT EDUCATION:  Education details:  TrA contraction.  Encouraged pt to be compliant with HEP.  Exercise rationale, log rolling, exercise form and POC Person educated: Patient Education method: Explanation, demonstration, verbal and tactile cues,  Education comprehension: verbalized understanding and needs further education, verbal and tactile cues required, returned demonstration   HOME EXERCISE PROGRAM: Access Code: [email or web address] URL: [email or web address] Date: 12/18/2021 Prepared by: Ronny Flurry  Exercises - Supine Transversus Abdominis Bracing - Hands on Stomach  - 3 x daily - 7 x weekly - 1-2 sets - 10 reps - Supine March  - 1-2 x daily - 7 x weekly - 2 sets - 10 reps - Supine Core Control with Heel Slide  - 1 x daily - 7 x weekly - 2 sets - 10 reps - Seated Long Arc Quad  - 1 x daily - 7 x weekly - 2 sets - 10 reps - Seated Ankle  Pumps on Table  - 2-3 x daily - 7 x weekly - 2 sets - 10 reps     ASSESSMENT:  CLINICAL IMPRESSION: Pt continues to have difficulty with performing TrA contractions correctly.  PT spent time educating pt concerning correct form with TrA including palpation and contraction.  Pt reports compliance with HEP.  Pt is improving with tolerance for exercises and progressing with exercises per protocol.  He continues to improve with functional mobility including ambulation and bed mobility.  Pt did require some cuing for log rolling though has improved with ease and speed.  He responded well to Rx having no increased pain after Rx.  Pt should benefit from continued skilled PT services per protocol to address impairments and goals and improve overall function.         OBJECTIVE IMPAIRMENTS Abnormal gait, decreased activity tolerance, decreased balance, decreased endurance, decreased mobility, difficulty walking, decreased ROM, decreased strength, hypomobility, impaired flexibility, postural dysfunction, and pain.   ACTIVITY LIMITATIONS cleaning, community activity, laundry, shopping, and ambulation .   PERSONAL FACTORS 3+ comorbidities: Hx of prior back surgeries including lumbar fusion, Neuropathy, R foot drop,  Macular degeneration, and DM   are also affecting patient's functional outcome.    REHAB POTENTIAL: Good  CLINICAL DECISION MAKING: Stable/uncomplicated  EVALUATION COMPLEXITY: Low   GOALS:  SHORT TERM GOALS:  STG Name Target Date Goal status  1 Pt will be independent and compliant with HEP for improved pain, strength, mobility, and function.  Baseline:  02/07/2022  INITIAL  2 Pt will demo improved gait speed, posture, and bilat step length with gait for improved community ambulation.  Baseline:  02/14/2022 INITIAL  3 Pt will be able to perform his normal daily transfers without difficulty.   Baseline: 01/08/2022 INITIAL  4 Pt will progress his walking program without adverse effects  for improved tolerance with activity and ambulation.   Baseline: 02/14/2022  INITIAL  5 Pt will demo good form with a log roll for improved bed mobility Baseline: 02/07/2022 INITIAL             LONG  TERM GOALS:   LTG Name Target Date Goal status  1 Pt will be able to ambulate extended community distance with LRAD without significant difficulty and pain.  Baseline: 02/05/2022 INITIAL  2 Pt will be able to play music without significant pain.  Baseline: 02/05/2022 INITIAL  3 Pt will demo improved core strength by progressing with core exercises per protocol without adverse effects for improved tolerance to and performance of daily activities and functional mobility.  Baseline: 02/05/2022 INITIAL  4 Pt will be able to his perform normal ADLs, standing activities and appropriate household chores without significant pain or difficulty.  Baseline: 02/05/2022 INITIAL  5 Pt will be able to ambulate in grocery store and shop without significant pain.  Baseline: 02/05/2022 INITIAL             PLAN: PT FREQUENCY: 2 times per week  PT DURATION: 8 weeks  PLANNED INTERVENTIONS: Therapeutic exercises, Therapeutic activity, Neuromuscular re-education, Balance training, Gait training, Patient/Family education, Joint mobilization, Stair training, DME instructions, Aquatic Therapy, Dry Needling, Electrical stimulation, Cryotherapy, Moist heat, Taping, and Manual therapy  PLAN FOR NEXT SESSION:  cont with log rolling.  Cont with core and LE strengthening per fusion protocol.  Progress HEP per pt tolerance and exercise form      Selinda Michaels III PT, DPT 01/17/22 8:45 PM

## 2022-01-17 ENCOUNTER — Ambulatory Visit (INDEPENDENT_AMBULATORY_CARE_PROVIDER_SITE_OTHER): Payer: Medicare Other | Admitting: Gastroenterology

## 2022-01-17 ENCOUNTER — Encounter (HOSPITAL_BASED_OUTPATIENT_CLINIC_OR_DEPARTMENT_OTHER): Payer: Self-pay | Admitting: Physical Therapy

## 2022-01-17 ENCOUNTER — Encounter: Payer: Self-pay | Admitting: Gastroenterology

## 2022-01-17 VITALS — BP 126/68 | HR 75 | Ht 70.25 in | Wt 234.0 lb

## 2022-01-17 DIAGNOSIS — K8681 Exocrine pancreatic insufficiency: Secondary | ICD-10-CM

## 2022-01-17 DIAGNOSIS — K529 Noninfective gastroenteritis and colitis, unspecified: Secondary | ICD-10-CM

## 2022-01-17 DIAGNOSIS — Z86010 Personal history of colonic polyps: Secondary | ICD-10-CM | POA: Diagnosis not present

## 2022-01-17 DIAGNOSIS — D132 Benign neoplasm of duodenum: Secondary | ICD-10-CM | POA: Diagnosis not present

## 2022-01-17 DIAGNOSIS — K760 Fatty (change of) liver, not elsewhere classified: Secondary | ICD-10-CM

## 2022-01-17 MED ORDER — PANCRELIPASE (LIP-PROT-AMYL) 36000-114000 UNITS PO CPEP: ORAL_CAPSULE | ORAL | 5 refills | Status: DC

## 2022-01-17 MED ORDER — NA SULFATE-K SULFATE-MG SULF 17.5-3.13-1.6 GM/177ML PO SOLN: 1.0000 | Freq: Once | ORAL | 0 refills | Status: AC

## 2022-01-17 NOTE — Patient Instructions (Addendum)
If you are age 73 or older, your body mass index should be between 23-30. Your Body mass index is 33.34 kg/m. If this is out of the aforementioned range listed, please consider follow up with your Primary Care Provider.  If you are age 2 or younger, your body mass index should be between 19-25. Your Body mass index is 33.34 kg/m. If this is out of the aformentioned range listed, please consider follow up with your Primary Care Provider.   ________________________________________________________  The [facility] providers would like to encourage you to use [facility] to communicate with providers for non-urgent requests or questions.  Due to long hold times on the telephone, sending your provider a message by [facility] may be a faster and more efficient way to get a response.  Please allow 48 business hours for a response.  Please remember that this is for non-urgent requests.  _______________________________________________________  Dennis Bast have been scheduled for an endoscopy and colonoscopy. Please follow the written instructions given to you at your visit today. Please pick up your prep supplies at the pharmacy within the next 1-3 days. If you use inhalers (even only as needed), please bring them with you on the day of your procedure.   Change Creon dosing.  Take 5 capsules with each meal and take 2 capsules with each snake.  A new prescription has been sent to your pharmacy.  Please purchase the following medications over the counter and take as directed: Citrucel: Take once daily  Thank you for entrusting me with your care and for choosing [facility], Dr. [place] Cellar

## 2022-01-17 NOTE — Progress Notes (Signed)
HPI :  71 year old male here for a follow-up visit.  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  History of colon polyps, history of duodenal adenomas, and pancreatic insufficiency.  Recall he presented in Spring 2022 with steatorrhea. Stool lactoferrin was negative, pancreatic fecal elastase was markedly low at 31.  He was started on Creon at the time.  He denied any abdominal pains with this, did not have any weight loss at the time.  He has no family history of pancreatic cancer.  He has had a history of significant alcohol use in the past.  He also has type 2 diabetes.  MRCP was obtained in May 2022 showing a normal-appearing pancreas.  He was also noted to have hepatic steatosis on that exam as well as cholelithiasis and a benign cyst vs. hemangioma of the spleen.  He has been on escalating doses of Creon since have last seen him.  He is currently taking 36,000 units of Creon capsules, 4 capsules with each meal and 1 or 2 with a snack.  He weighs 234 pounds.  He definitely does notice some improvement with the Creon although not consistently.  Some days his bowels are fine, sometimes he continues to have stearrhea, perhaps 50% of the time he will pass fatty/greasy stools that float and water.  He does admit that sometimes he eats without taking them.  He on average has 1 bowel movement per day, occasional urgency.  No blood in his stools.  He has been on Trulicity for a few years now for his diabetes.  Since I have last seen him he had a spinal fusion in February.  It looks like in March she was admitted to [facility] with delirium, thought to be combination of alcohol and benzodiazepines.  He was admitted for detox.  He has got through that and states has been completely alcohol free since that time and doing much better.  He states his mobility is better, his pain is much better, he is lost about 20 pounds during  this process.  He states this is the best he has felt in a long time.  His diabetes is under better control, A1c went from 7.2-6.7.  This is actually improved before he has lost his weight.  He is awaiting follow-up labs for that.  He denies any cardiopulmonary symptoms at this time.  Recall he also has history of Barrett's esophagus and numerous adenomatous colon polyps and also has had duodenal adenomas.    He has had a large duodenal EMR for duodenal adenoma done at [facility] in 2019.  He had a follow-up EGD with me in July 2021 which showed no evidence of recurrence.  He has had multiple colonoscopies in the past with numerous polyps as well.  He is due for surveillance colonoscopy in August, surveillance endoscopy in July.    Procedural history : EGD 03/14/2020 - Esophagogastric landmarks identified. - 1 cm hiatal hernia. - Z-line irregular as outlined above. Biopsied. - A few benign appearing gastric polyps. - Small superficial gastric ulcer with no stigmata of bleeding. - Nodular mucosa in the gastric antrum with erosive changes. Biopsied. - Normal stomach otherwise, biopsies taken to rule out H pylori - Mucosal changes in the duodenum as outlined - no high risk lesions appreciated. Biopsied.   Colonoscopy 03/20/2018 - cecal AVM, 21 polyps removed, fair prep, diverticulosis - 20 polyps adenoma, referred to genetic counselor  EGD 04/15/2018 - duodenal scar with small nodule at edge - biopsies done - no residual adenoma EGD 10/16/17 - Dr. Mont Dutton - piecemal resection of duodenal adenoma via EMR - path c/w adenoma EGD 10/07/2017 - [facility], stomach filled with food, procedure aborted EGD 09/16/2017 - polypectomy was planned but procedure aborted due to size of polyp and spasm, referred to [facility]   EGD 07/15/2017 - irregular z-line, multiple gastric polyps, duodenal polyp,  Colonoscopy 09/18/2012 - small polyps, diverticulosis, one TA - recall in 5 years   Colonoscopy 04/24/19 - The perianal and digital  rectal examinations were normal. - A single large angiodysplastic lesion was found in the cecum. - A diminutive polyp was found in the cecum. The polyp was sessile. The polyp was removed with a cold snare. Resection and retrieval were complete. - A 3 mm polyp was found in the hepatic flexure. The polyp was sessile. The polyp was removed with a cold snare. Resection and retrieval were complete. - Four sessile polyps were found in the transverse colon. The polyps were 3 to 4 mm in size. These polyps were removed with a cold snare. Resection and retrieval were complete. - Multiple small-mouthed diverticula were found in the left colon. - The colon was extremely spastic which prolonged the procedure. - The exam was otherwise without abnormality.   Path c/w adenomas - repeat colonoscopy in 3 years     Fecal pancreatic elastase 31 Fecal lactoferrin negative     MRCP 01/11/21: IMPRESSION: 1. Normal pancreas. 2. Hepatic steatosis. 3. Cholelithiasis without evidence cholecystitis. No choledocholithiasis 4. Indeterminate lesion in the spleen likely represents a benign complex cyst or hemangioma.   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    stenosis and arthritis   Depression    Diabetes ([place])    Diverticulosis    ED (erectile dysfunction)    Exocrine pancreatic insufficiency    Fatty liver    GERD (gastroesophageal reflux disease)    HOH (hard of hearing)    Hyperlipidemia    Hypertension    Hypothyroidism    Internal hemorrhoids    Leg edema, left    Macular degeneration    Neuromuscular disorder (HCC)    Osteoarthritis    Peripheral neuropathy    Sleep apnea    cpap   Testosterone deficiency    Tremors of nervous system    Umbilical hernia      Past Surgical History:  Procedure Laterality  Date   BACK SURGERY  2009   BACK SURGERY  09/2014   Dr Marykay Lex   CATARACT EXTRACTION, BILATERAL  12/2016   COLONOSCOPY     ESOPHAGOGASTRODUODENOSCOPY (EGD) WITH PROPOFOL N/A 09/16/2017   Procedure: ESOPHAGOGASTRODUODENOSCOPY (EGD) WITH PROPOFOL;  Surgeon: Yetta Flock, MD;  Location: [facility];  Service: Gastroenterology;  Laterality: N/A;   LUMBAR LAMINECTOMY/ DECOMPRESSION WITH MET-RX  2014   TONSILLECTOMY  3845   UMBILICAL HERNIA REPAIR     Family History  Problem Relation Age of Onset   Atrial fibrillation Brother    Cancer Mother 11       abdominal cancer, unk name of cancer type   Other Father        viral encephalysis    Heart attack Maternal Grandfather    Heart attack Paternal  Grandfather    Colon cancer Neg Hx    Stomach cancer Neg Hx    Colon polyps Neg Hx    Esophageal cancer Neg Hx    Rectal cancer Neg Hx    Social History   Tobacco Use   Smoking status: Former    Types: Cigarettes    Quit date: 08/25/2002    Years since quitting: 19.4   Smokeless tobacco: Never   Tobacco comments:    in college only 40 years ago  Vaping Use   Vaping Use: Never used  Substance Use Topics   Alcohol use: Yes    Alcohol/week: 14.0 - 28.0 standard drinks    Types: 14 - 28 Standard drinks or equivalent per week    Comment: varies  daily drinker   Drug use: No   Current Outpatient Medications  Medication Sig Dispense Refill   amLODipine (NORVASC) 10 MG tablet Take 10 mg Daily by mouth.      anastrozole (ARIMIDEX) 1 MG tablet Take 1 mg daily by mouth.     atorvastatin (LIPITOR) 20 MG tablet Take 20 mg by mouth daily.     chlorhexidine (PERIDEX) 0.12 % solution Use as directed 10 mLs in the mouth or throat 2 (two) times daily as needed.   0   Cyanocobalamin 5000 MCG CAPS Take 2 capsules by mouth in the morning and at bedtime.     diclofenac (VOLTAREN) 75 MG EC tablet Take 75 mg by mouth 2 (two) times daily as needed (for pain.).     Dulaglutide (TRULICITY) 1.5 JI/9.6VE  SOPN Inject into the skin.     fluocinonide ointment (LIDEX) 9.38 % Apply 1 application topically 2 (two) times daily as needed (IRRITATION).     fluticasone (FLONASE) 50 MCG/ACT nasal spray Place 1 spray into both nostrils daily as needed for allergies or rhinitis.     gabapentin (NEURONTIN) 100 MG capsule Take 200 mg by mouth at bedtime.     haloperidol (HALDOL) 2 MG tablet Take 2 mg by mouth daily.     JARDIANCE 10 MG TABS tablet Take 10 mg by mouth daily.     Lancets (ONETOUCH ULTRASOFT) lancets 1 each by Other route daily.     Melatonin 3 MG CAPS Take by mouth 3 (three) times daily.     metFORMIN (GLUCOPHAGE-XR) 500 MG 24 hr tablet Take 1,000 mg by mouth 2 (two) times daily.     MILK THISTLE PO Take 1 capsule by mouth daily.     Multiple Vitamins-Minerals (ICAPS AREDS 2 PO) Take 2 capsules by mouth in the morning and at bedtime.     mupirocin ointment (BACTROBAN) 2 % Apply topically 2 (two) times daily. to affected area     Na Sulfate-K Sulfate-Mg Sulf 17.5-3.13-1.6 GM/177ML SOLN Take 1 kit by mouth once for 1 dose. 354 mL 0   olmesartan (BENICAR) 40 MG tablet Take 40 mg by mouth daily.     Omega-3 Fatty Acids (FISH OIL) 1200 MG CAPS Take 1 capsule by mouth daily.      omeprazole (PRILOSEC) 20 MG capsule Take 1 capsule by mouth daily.     sertraline (ZOLOFT) 100 MG tablet Take 200 mg by mouth daily.      tamsulosin (FLOMAX) 0.4 MG CAPS capsule Take 0.4 mg by mouth daily.     testosterone cypionate (DEPOTESTOTERONE CYPIONATE) 200 MG/ML injection Inject 200 mg into the muscle every 14 (fourteen) days.   1   Thiamine HCl (B-1) 100 MG TABS Take  2 tablets by mouth daily.     traZODone (DESYREL) 25 mg TABS tablet Take 25 mg by mouth at bedtime.     TURMERIC PO Take 1,000 mg by mouth daily.      zolpidem (AMBIEN) 10 MG tablet Take 10 mg by mouth daily as needed. For sleep     lipase/protease/amylase (CREON) 36000 UNITS CPEP capsule Take 5 capsules with each meal take 2 capsules with snacks 570  capsule 5   No current facility-administered medications for this visit.   No Known Allergies   Review of Systems: All systems reviewed and negative except where noted in HPI.   Lab Results  Component Value Date   WBC 7.5 05/29/2021   HGB 15.7 05/29/2021   HCT 47.0 05/29/2021   MCV 102.6 (H) 05/29/2021   PLT 125 (L) 05/29/2021    Lab Results  Component Value Date   CREATININE 0.98 05/29/2021   BUN 21 05/29/2021   NA 142 05/29/2021   K 3.8 05/29/2021   CL 104 05/29/2021   CO2 23 05/29/2021   Lab Results  Component Value Date   ALT 41 05/29/2021   AST 26 05/29/2021   ALKPHOS 55 05/29/2021   BILITOT 1.7 (H) 05/29/2021     Physical Exam: BP 126/68   Pulse 75   Ht 5' 10.25" (1.784 m)   Wt 234 lb (106.1 kg)   BMI 33.34 kg/m  Constitutional: Pleasant,well-developed, male in no acute distress. Neurological: Alert and oriented to person place and time. Psychiatric: Normal mood and affect. Behavior is normal.   ASSESSMENT AND PLAN: 71 year old male here for reassessment of the following:  Exocrine pancreatic insufficiency Chronic diarrhea History of colon polyps Duodenal adenoma Fatty liver  As above, patient with symptoms of stearrhea and markedly low pancreatic fecal elastase consistent with exocrine pancreatic insufficiency.  His MRCP showed normal-appearing pancreas.  He has had a history of significant alcohol use, admitted with mental status change associated with alcohol and Valium use at [facility], went through detox, now alcohol free for the past few months which is excellent news.  Creon is definitely helping him but he continues to have some symptoms that bother him.  It sounds like he is not taking Creon reliably with snacks etc., having some breakthrough.  Counseled him to take this every time he eats.  Also recommended that he increase his Creon to 5 tabs with each meal based on his weight.  He can also take Citrucel once daily to see if that can provide  some regularity to his bowel habits.  He is due for surveillance colonoscopy in the next few months for his history of colon polyps, will make sure no evidence of microscopic colitis etc. otherwise.  He is also due for surveillance EGD for his history of large duodenal adenoma status post EMR.  We will do these together over the summer.  I discussed risk benefits of these procedures with him and he wants to proceed.  He otherwise will continue with complete alcohol abstinence.  His generally feeling much better since doing this.  He has a history of fatty liver related to this but no evidence of overt cirrhosis on his imaging, low platelets noted.  We will keep an eye on his liver function testing moving forward.  He will continue to work on weight loss.  Plan: - increase Creon to 5 tabs with meals, 2 tabs with snacks - continue alcohol abstinence - start Citrucel once daily - counseled on fatty liver -  EGD and colonoscopy at the [facility] this summer prior to TURP - continue omeprazole  Jolly Mango, MD [facility]

## 2022-01-18 ENCOUNTER — Telehealth: Payer: Self-pay | Admitting: Gastroenterology

## 2022-01-18 ENCOUNTER — Ambulatory Visit (HOSPITAL_BASED_OUTPATIENT_CLINIC_OR_DEPARTMENT_OTHER): Payer: Medicare Other | Admitting: Physical Therapy

## 2022-01-18 DIAGNOSIS — M6281 Muscle weakness (generalized): Secondary | ICD-10-CM

## 2022-01-18 DIAGNOSIS — M546 Pain in thoracic spine: Secondary | ICD-10-CM | POA: Diagnosis not present

## 2022-01-18 DIAGNOSIS — M545 Low back pain, unspecified: Secondary | ICD-10-CM

## 2022-01-18 DIAGNOSIS — R262 Difficulty in walking, not elsewhere classified: Secondary | ICD-10-CM

## 2022-01-18 NOTE — Therapy (Signed)
OUTPATIENT PHYSICAL THERAPY TREATMENT    Patient Name: Robert Castro MRN: [id number] DOB:[birth year], 71 y.o., male Today's Date: 01/19/2022   PT End of Session - 01/18/22 1617     Visit Number 9    Number of Visits 19    Date for PT Re-Evaluation 02/05/22    Authorization Type Medicare A and B    Progress Note Due on Visit 33    PT Start Time 1537    PT Stop Time 1620    PT Time Calculation (min) 43 min    Activity Tolerance Patient tolerated treatment well    Behavior During Therapy WFL for tasks assessed/performed                    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    stenosis and arthritis   Depression    Diabetes ([place])    Diverticulosis    ED (erectile dysfunction)    Exocrine pancreatic insufficiency    Fatty liver    GERD (gastroesophageal reflux disease)    HOH (hard of hearing)    Hyperlipidemia    Hypertension    Hypothyroidism    Internal hemorrhoids    Leg edema, left    Macular degeneration    Neuromuscular disorder (HCC)    Osteoarthritis    Peripheral neuropathy    Sleep apnea    cpap   Testosterone deficiency    Tremors of nervous system    Umbilical hernia    Past Surgical History:  Procedure Laterality Date   BACK SURGERY  2009   BACK SURGERY  09/2014   Dr Marykay Lex   CATARACT EXTRACTION, BILATERAL  12/2016   COLONOSCOPY     ESOPHAGOGASTRODUODENOSCOPY (EGD) WITH PROPOFOL N/A 09/16/2017   Procedure: ESOPHAGOGASTRODUODENOSCOPY (EGD) WITH PROPOFOL;  Surgeon: Yetta Flock, MD;  Location: [facility];  Service: Gastroenterology;  Laterality: N/A;   LUMBAR LAMINECTOMY/ DECOMPRESSION WITH MET-RX  2014   TONSILLECTOMY  6629   UMBILICAL HERNIA REPAIR     Patient Active Problem List   Diagnosis Date Noted   Genetic testing 05/01/2018   Adenomatous  duodenal polyp    Diarrhea 05/05/2015   GERD 12/01/2007   DYSPHAGIA UNSPECIFIED 12/01/2007   INTERNAL HEMORRHOIDS 03/06/2007   BARRETTS ESOPHAGUS 01/29/2007   DIVERTICULOSIS, COLON 08/01/2005   COLONIC POLYPS, ADENOMATOUS 07/09/2002    PCP: Aletha Halim., PA-C  REFERRING PROVIDER: Doran Clay, MD   REFERRING DIAG:   M48.00  (ICD-10-CM) - Spinal stenosis, site unspecified           G62.9 (ICD-10-CM) - Polyneuropathy, unspecified M19.90 (ICD-10-CM) - Unspecified osteoarthritis, unspecified site           M48.061 (ICD-10-CM) - Spinal stenosis, lumbar 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  Pt underwent posterior T3-Iliac Wing revision extension instrumentation and fusion with T10 Laminectomy, pedicle fixation and rods 13 or more vertebral segments, pelvic fixation other than sacrum, and laminectomy, facetectomy, foraminotomy on 1 lumbar segment on 10/03/2021.  PT records in [place] indicated T9-L2 revision extension instrumentation and fusion.  Pt has B/L/T precautions; no bending, lifting, and twisting.  -Pt is 15 weeks and 2 days post op.    -He reports improved mobility including bed mobility and performance of transfers including getting off the couch and getting into the car.  Pt also reports improved ambulation and standing tolerance.  Pt states he felt good after  prior Rx. Pt reports he has been compliant with HEP.  Pt states he came to [facility] yesterday and performed the elliptical bike for 30 mins.  He denies any increased pain.  Pt states his R foot drop interferes with ambulation.       PERTINENT HISTORY:  -posterior T3-Iliac Wing revision extension instrumentation and fusion with T10 Laminectomy, pedicle fixation and rods 13 or more vertebral segments, pelvic fixation other than sacrum, and laminectomy, facetectomy, foraminotomy on 1 lumbar segment on 10/03/2021. -Hx of 3 prior back surgeries including fusion and decompression; 2016 lumbar fusion -Neuropathy, degenerative scoliosis, R foot drop,  Warnicke Korsakov syndrome, Macular degeneration, DM, depression, and anxiety   PAIN:  NPRS scale: "pain is negligible" Pain location:  Pt reports his rib pain is not gone, but is 75% better.  Pt denies pain in LE's.   PRECAUTIONS: Back and Fall; Pt had fusion and laminectomy.  He has bending, lifting, and twisting precautions.  Falls due to neuropathy.  Pt has R foot drop.  Multiple prior back surgeries.   FALLS:  Has patient fallen in last 6 months? Yes, Number of falls: 3  LIVING ENVIRONMENT: Lives with: lives alone Lives in: 1 level home Stairs: 3-4 steps with rail to enter the front Has following equipment at home: Single point cane, Walker - 2 wheeled, and Environmental consultant - 4 wheeled  OCCUPATION: Pt is retired.  PLOF: Independent; Pt used SPC on occasion especially at night.  He was able to ambulate community distance without significant difficulty. He was careful with stairs.  Pt states he has had falls since 2019 due to neuropathy.  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  Pt is a member at [employer] and was working out by swimming and using some of the machines in the fitness center.     PATIENT GOALS wants to be able to travel, improve ambulation, back and LE strength. Pt would like to get in the  water when he can.    OBJECTIVE:   DIAGNOSTIC FINDINGS:  X rays, CT scan, and MRI before surgery. brain CT and MRI which showed no acute intracranial abnormalities.  TODAY'S TREATMENT     Therapeutic Exercise:   -Reviewed current function, HEP compliance, pain levels, and response to prior Rx.  -Educated pt concerning TrA contraction performance and palpation.  Pt performed TrA contractions with cuing and instruction for correct form -Pt performed:  Supine alt LE ext with TrA while holding ball at 90 deg flexion 2x10  Supine alt UE/LE with TrA 2x10 reps  Supine SLR x 10 reps bilat  Supine HS stretch with strap2 x20 sec bilat  S/L clams x10 reps AROM and x10 reps with RTB  Supine bridge 2x10 reps  NuStep at L2 with UE/LE x 5 mins    -PT answered pt's questions.    Therapeutic Activities:  Educated pt with log rolling.   PATIENT EDUCATION:  Education details:  TrA contraction.  Encouraged pt to be compliant with HEP.  Exercise rationale, log rolling, exercise form and POC Person educated: Patient Education method: Explanation, demonstration, verbal and tactile cues,  Education comprehension: verbalized understanding and needs further education, verbal and tactile cues required, returned demonstration   HOME EXERCISE PROGRAM: Access Code: [email or web address] URL: [email or web address] Date: 12/18/2021 Prepared by: Ronny Flurry  Exercises - Supine Transversus Abdominis Bracing - Hands on Stomach  - 3 x daily - 7 x weekly - 1-2 sets - 10 reps - Supine March  - 1-2 x daily - 7 x weekly - 2 sets - 10 reps - Supine Core Control with Heel Slide  - 1 x daily - 7 x weekly - 2 sets - 10 reps - Seated Long Arc Quad  - 1 x daily - 7 x weekly - 2 sets - 10 reps - Seated Ankle Pumps on Table  - 2-3 x daily - 7 x weekly - 2 sets - 10 reps     ASSESSMENT:  CLINICAL IMPRESSION: Pt is progressing well with functional mobility as evidenced by subjective reports.  Pt continues to  require cuing and instruction with performing TrA contractions correctly.  Pt reports compliance with HEP.  PT progressed exercises per protocol and pt tolerated progression well without c/o's.  Pt did require some cuing for log rolling though has improved with ease and speed.  He responded well to Rx having no increased pain after Rx.  Pt should benefit from continued skilled PT services per protocol to address impairments and goals and improve overall function.         OBJECTIVE IMPAIRMENTS Abnormal gait, decreased activity tolerance, decreased balance, decreased endurance, decreased mobility, difficulty walking, decreased ROM, decreased strength, hypomobility, impaired flexibility, postural dysfunction, and pain.   ACTIVITY LIMITATIONS cleaning, community activity, laundry, shopping, and ambulation .   PERSONAL FACTORS 3+ comorbidities: Hx of prior back surgeries including lumbar fusion, Neuropathy, R foot drop,  Macular degeneration, and DM   are also affecting patient's functional outcome.    REHAB POTENTIAL: Good  CLINICAL DECISION MAKING: Stable/uncomplicated  EVALUATION COMPLEXITY: Low   GOALS:  SHORT TERM GOALS:  STG Name Target Date Goal status  1 Pt will be independent and compliant with HEP for improved pain, strength, mobility, and function.  Baseline:  02/09/2022  INITIAL  2 Pt will demo improved gait speed, posture, and bilat step length with gait for improved community ambulation.  Baseline:  02/16/2022 INITIAL  3 Pt will be able to perform his normal daily transfers without difficulty.   Baseline: 01/08/2022 INITIAL  4 Pt will progress his walking program without adverse effects for improved tolerance with activity and ambulation.   Baseline: 02/16/2022  INITIAL  5 Pt will demo good form with a log roll for improved bed mobility Baseline: 02/09/2022 INITIAL             LONG TERM GOALS:   LTG Name Target Date Goal status  1 Pt will be able to ambulate extended  community distance with LRAD without significant difficulty and pain.  Baseline: 02/05/2022 INITIAL  2 Pt will be able to play music without significant pain.  Baseline: 02/05/2022 INITIAL  3 Pt will demo improved core strength by progressing with core exercises per protocol without adverse effects for improved  tolerance to and performance of daily activities and functional mobility.  Baseline: 02/05/2022 INITIAL  4 Pt will be able to his perform normal ADLs, standing activities and appropriate household chores without significant pain or difficulty.  Baseline: 02/05/2022 INITIAL  5 Pt will be able to ambulate in grocery store and shop without significant pain.  Baseline: 02/05/2022 INITIAL             PLAN: PT FREQUENCY: 2 times per week  PT DURATION: 8 weeks  PLANNED INTERVENTIONS: Therapeutic exercises, Therapeutic activity, Neuromuscular re-education, Balance training, Gait training, Patient/Family education, Joint mobilization, Stair training, DME instructions, Aquatic Therapy, Dry Needling, Electrical stimulation, Cryotherapy, Moist heat, Taping, and Manual therapy  PLAN FOR NEXT SESSION:  cont with log rolling.  Cont with core and LE strengthening per fusion protocol.  Progress HEP per pt tolerance and exercise form      Selinda Michaels III PT, DPT 01/19/22 2:34 PM

## 2022-01-18 NOTE — Telephone Encounter (Signed)
Patient called states he spoke with a nurse yesterday after a ov on 01/17/22. Patient states the list of new medications listed in his chart are incorrect. Patient also states that some of the medications that he spoke with the nurse about are not listed as well. Please give patient a call back to advise.  Thank You.

## 2022-01-19 ENCOUNTER — Encounter (HOSPITAL_BASED_OUTPATIENT_CLINIC_OR_DEPARTMENT_OTHER): Payer: Self-pay | Admitting: Physical Therapy

## 2022-01-19 ENCOUNTER — Other Ambulatory Visit: Payer: Self-pay

## 2022-01-19 NOTE — Progress Notes (Signed)
Patient brought in medication list changes. Med list updated

## 2022-01-19 NOTE — Telephone Encounter (Signed)
MyChart message sent to pt

## 2022-01-22 NOTE — Therapy (Signed)
OUTPATIENT PHYSICAL THERAPY TREATMENT    Patient Name: Robert Castro MRN: [id number] DOB:[birth year], 71 y.o., male Today's Date: 01/24/2022   PT End of Session - 01/23/22 1617     Visit Number 10    Number of Visits 19    Date for PT Re-Evaluation 02/05/22    Authorization Type Medicare A and B    Progress Note Due on Visit 56    PT Start Time 1615   Pt arrived late   PT Stop Time 1700    PT Time Calculation (min) 45 min    Activity Tolerance Patient tolerated treatment well    Behavior During Therapy WFL for tasks assessed/performed                     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    stenosis and arthritis   Depression    Diabetes ([place])    Diverticulosis    ED (erectile dysfunction)    Exocrine pancreatic insufficiency    Fatty liver    GERD (gastroesophageal reflux disease)    HOH (hard of hearing)    Hyperlipidemia    Hypertension    Hypothyroidism    Internal hemorrhoids    Leg edema, left    Macular degeneration    Neuromuscular disorder (HCC)    Osteoarthritis    Peripheral neuropathy    Sleep apnea    cpap   Testosterone deficiency    Tremors of nervous system    Umbilical hernia    Past Surgical History:  Procedure Laterality Date   BACK SURGERY  2009   BACK SURGERY  09/2014   Dr Marykay Lex   CATARACT EXTRACTION, BILATERAL  12/2016   COLONOSCOPY     ESOPHAGOGASTRODUODENOSCOPY (EGD) WITH PROPOFOL N/A 09/16/2017   Procedure: ESOPHAGOGASTRODUODENOSCOPY (EGD) WITH PROPOFOL;  Surgeon: Yetta Flock, MD;  Location: [facility];  Service: Gastroenterology;  Laterality: N/A;   LUMBAR LAMINECTOMY/ DECOMPRESSION WITH MET-RX  2014   TONSILLECTOMY  6767   UMBILICAL HERNIA REPAIR     Patient Active Problem List   Diagnosis Date Noted   Genetic testing  05/01/2018   Adenomatous duodenal polyp    Diarrhea 05/05/2015   GERD 12/01/2007   DYSPHAGIA UNSPECIFIED 12/01/2007   INTERNAL HEMORRHOIDS 03/06/2007   BARRETTS ESOPHAGUS 01/29/2007   DIVERTICULOSIS, COLON 08/01/2005   COLONIC POLYPS, ADENOMATOUS 07/09/2002    PCP: Aletha Halim., PA-C  REFERRING PROVIDER: Doran Clay, MD   REFERRING DIAG:   M48.00  (ICD-10-CM) - Spinal stenosis, site unspecified           G62.9 (ICD-10-CM) - Polyneuropathy, unspecified M19.90 (ICD-10-CM) - Unspecified osteoarthritis, unspecified site           M48.061 (ICD-10-CM) - Spinal stenosis, lumbar 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  Pt underwent posterior T3-Iliac Wing revision extension instrumentation and fusion with T10 Laminectomy, pedicle fixation and rods 13 or more vertebral segments, pelvic fixation other than sacrum, and laminectomy, facetectomy, foraminotomy on 1 lumbar segment on 10/03/2021.  PT records in [place] indicated T9-L2 revision extension instrumentation and fusion.  Pt has B/L/T precautions; no bending, lifting, and twisting.  -Pt is 16 weeks post op.    -He reports improved mobility including bed mobility and performance of transfers including getting off the couch and getting into the car.  Pt also reports improved ambulation and standing tolerance.  Pt states he felt  good after prior Rx. Pt reports he has been compliant with HEP.  Pt states his R foot drop interferes with ambulation.  He is able to ambulate 15 mins.  Pt states he plans to walk the track at [facility] after therapy today.     PERTINENT HISTORY:  -posterior T3-Iliac Wing revision extension instrumentation and fusion with T10 Laminectomy, pedicle fixation and rods 13 or more vertebral segments, pelvic fixation other than sacrum, and laminectomy, facetectomy, foraminotomy on 1 lumbar segment on 10/03/2021. -Hx of 3 prior back surgeries including fusion and decompression; 2016 lumbar fusion -Neuropathy, degenerative scoliosis, R foot drop,  Warnicke Korsakov syndrome, Macular degeneration, DM, depression, and anxiety   PAIN:  NPRS scale: Pt states his pain is very low.   PRECAUTIONS: Back and Fall; Pt had fusion and laminectomy.  He has bending, lifting, and twisting precautions.  Falls due to neuropathy.  Pt has R foot drop.  Multiple prior back surgeries.   FALLS:  Has patient fallen in last 6 months? Yes, Number of falls: 3  LIVING ENVIRONMENT: Lives with: lives alone Lives in: 1 level home Stairs: 3-4 steps with rail to enter the front Has following equipment at home: Single point cane, Walker - 2 wheeled, and Environmental consultant - 4 wheeled  OCCUPATION: Pt is retired.  PLOF: Independent; Pt used SPC on occasion especially at night.  He was able to ambulate community distance without significant difficulty. He was careful with stairs.  Pt states he has had falls since 2019 due to neuropathy.  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  Pt is a member at [employer] and was working out by swimming and using some of the machines in the fitness center.     PATIENT GOALS wants to be able to travel, improve ambulation, back and LE strength. Pt would like to get in the water when he can.    OBJECTIVE:   DIAGNOSTIC FINDINGS:  X rays, CT scan, and MRI  before surgery. brain CT and MRI which showed no acute intracranial abnormalities.  TODAY'S TREATMENT     Therapeutic Exercise:   -Reviewed current function, HEP compliance, pain levels, and response to prior Rx.  -Educated pt concerning TrA contraction performance and palpation.  -Pt performed:  NuStep at L2 with UE/LE x 5 mins Supine alt LE ext with TrA while holding ball at 90 deg flexion 2x10  Supine alt UE/LE with TrA 2x10 reps  Supine HS stretch with strap 2 x20 sec bilat  LAQ with GTB 2x10 reps  Supine bridge 2x10 reps Updated HEP and gave pt a HEP handout.  Educated pt in correct form and appropriate frequency.     Therapeutic Activities:  Pt ambulated with verbal, visual, and tactile cuing to correct posture.  Pt stood in front of mirror and received cues to improve posture.[name]   PATIENT EDUCATION:  Education details:  Updated HEP and gave pt a HEP handout.  Educated pt he should not have pain with exercises.  TrA contraction.  Posture in standing and with gait. exercise form. Person educated: Patient Education method: Explanation, demonstration, verbal and tactile cues,  Education comprehension: verbalized understanding and needs further education, verbal and tactile cues required, returned demonstration   HOME EXERCISE PROGRAM: Access Code: [email or web address] URL: [email or web address] Date: 01/23/2022 Prepared by: Ronny Flurry  Exercises - Supine Transversus Abdominis Bracing - Hands on Stomach  - 3 x daily - 7 x weekly - 1-2 sets - 10 reps - Supine March  - 1-2 x daily - 7 x weekly - 2 sets - 10 reps - Supine Core Control with Heel Slide  - 1 x daily - 7 x weekly - 2 sets - 10 reps - Seated Ankle Pumps on Table  - 2-3 x daily - 7 x weekly - 2 sets - 10 reps - Hooklying Clamshell with Resistance  - 1 x daily - 4-5 x weekly - 2 sets - 10 reps - Seated Knee Extension with Resistance  - 1 x daily - 3-4 x weekly - 2 sets - 10 reps - Standing Shoulder Row with  Anchored Resistance  - 1 x daily - 4-5 x weekly - 2 sets - 10 reps - Shoulder extension with resistance - Neutral  - 1 x daily - 4-5 x weekly - 2 sets - 10 reps - Supine Hamstring Stretch with Strap  - 2 x daily - 7 x weekly - 2 reps - 20-30 seconds hold     ASSESSMENT:  CLINICAL IMPRESSION: Pt is progressing well with functional mobility, strength, and protocol.  Pt continues to have a lean with standing which worsens with gait.  Pt demonstrates improved posture with standing and with gait with cuing and focus.  Pt has difficulty performing TrA contraction and continues to require cuing and instruction for correct form.  Pt reports compliance with HEP and PT updated HEP today.  He responded well to Rx having no increased pain after Rx.  Pt should benefit from continued skilled PT services per protocol to address impairments and goals and improve overall function.         OBJECTIVE IMPAIRMENTS Abnormal gait, decreased activity tolerance, decreased balance, decreased endurance, decreased mobility, difficulty walking, decreased ROM, decreased strength, hypomobility, impaired flexibility, postural dysfunction, and pain.   ACTIVITY LIMITATIONS cleaning, community activity, laundry, shopping, and ambulation .   PERSONAL FACTORS 3+ comorbidities: Hx of prior back surgeries including lumbar fusion, Neuropathy, R foot drop,  Macular degeneration, and DM   are also affecting patient's functional outcome.    REHAB POTENTIAL: Good  CLINICAL DECISION MAKING: Stable/uncomplicated  EVALUATION COMPLEXITY: Low   GOALS:  SHORT TERM GOALS:  STG Name Target Date Goal status  1 Pt will be independent and compliant with HEP for improved pain, strength, mobility, and function.  Baseline:  02/14/2022  INITIAL  2 Pt will demo improved gait speed, posture, and bilat step length with gait for improved community ambulation.  Baseline:  02/21/2022 INITIAL  3 Pt will be able to perform his normal daily  transfers without difficulty.   Baseline: 01/08/2022 INITIAL  4 Pt will progress his walking program without adverse effects for improved tolerance with activity and ambulation.   Baseline: 02/21/2022  INITIAL  5 Pt will demo good form with a log  roll for improved bed mobility Baseline: 02/14/2022 INITIAL             LONG TERM GOALS:   LTG Name Target Date Goal status  1 Pt will be able to ambulate extended community distance with LRAD without significant difficulty and pain.  Baseline: 02/05/2022 INITIAL  2 Pt will be able to play music without significant pain.  Baseline: 02/05/2022 INITIAL  3 Pt will demo improved core strength by progressing with core exercises per protocol without adverse effects for improved tolerance to and performance of daily activities and functional mobility.  Baseline: 02/05/2022 INITIAL  4 Pt will be able to his perform normal ADLs, standing activities and appropriate household chores without significant pain or difficulty.  Baseline: 02/05/2022 INITIAL  5 Pt will be able to ambulate in grocery store and shop without significant pain.  Baseline: 02/05/2022 INITIAL             PLAN: PT FREQUENCY: 2 times per week  PT DURATION: 8 weeks  PLANNED INTERVENTIONS: Therapeutic exercises, Therapeutic activity, Neuromuscular re-education, Balance training, Gait training, Patient/Family education, Joint mobilization, Stair training, DME instructions, Aquatic Therapy, Dry Needling, Electrical stimulation, Cryotherapy, Moist heat, Taping, and Manual therapy  PLAN FOR NEXT SESSION:  cont with log rolling.  Cont with core and LE strengthening per fusion protocol.    Selinda Michaels III PT, DPT 01/24/22 5:20 PM

## 2022-01-23 ENCOUNTER — Ambulatory Visit (HOSPITAL_BASED_OUTPATIENT_CLINIC_OR_DEPARTMENT_OTHER): Payer: Medicare Other | Admitting: Physical Therapy

## 2022-01-23 ENCOUNTER — Encounter (HOSPITAL_BASED_OUTPATIENT_CLINIC_OR_DEPARTMENT_OTHER): Payer: Self-pay | Admitting: Physical Therapy

## 2022-01-23 DIAGNOSIS — M6281 Muscle weakness (generalized): Secondary | ICD-10-CM

## 2022-01-23 DIAGNOSIS — M545 Low back pain, unspecified: Secondary | ICD-10-CM

## 2022-01-23 DIAGNOSIS — M546 Pain in thoracic spine: Secondary | ICD-10-CM

## 2022-01-23 DIAGNOSIS — R262 Difficulty in walking, not elsewhere classified: Secondary | ICD-10-CM

## 2022-01-25 ENCOUNTER — Encounter (HOSPITAL_BASED_OUTPATIENT_CLINIC_OR_DEPARTMENT_OTHER): Payer: Self-pay | Admitting: Physical Therapy

## 2022-01-25 ENCOUNTER — Ambulatory Visit (HOSPITAL_BASED_OUTPATIENT_CLINIC_OR_DEPARTMENT_OTHER): Payer: Medicare Other | Attending: Physical Medicine and Rehabilitation | Admitting: Physical Therapy

## 2022-01-25 DIAGNOSIS — M546 Pain in thoracic spine: Secondary | ICD-10-CM | POA: Diagnosis present

## 2022-01-25 DIAGNOSIS — R262 Difficulty in walking, not elsewhere classified: Secondary | ICD-10-CM | POA: Diagnosis present

## 2022-01-25 DIAGNOSIS — M6281 Muscle weakness (generalized): Secondary | ICD-10-CM | POA: Diagnosis present

## 2022-01-25 DIAGNOSIS — M545 Low back pain, unspecified: Secondary | ICD-10-CM | POA: Insufficient documentation

## 2022-01-25 NOTE — Therapy (Signed)
OUTPATIENT PHYSICAL THERAPY TREATMENT    Patient Name: Robert Castro MRN: [id number] DOB:02/12/1951, 71 y.o., male Today's Date: 01/25/2022   PT End of Session - 01/25/22 1706     Visit Number 11    Number of Visits 19    Date for PT Re-Evaluation 02/05/22    Authorization Type Medicare A and B    PT Start Time 0737    PT Stop Time 1652    PT Time Calculation (min) 38 min    Activity Tolerance Patient tolerated treatment well    Behavior During Therapy WFL for tasks assessed/performed                      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    stenosis and arthritis   Depression    Diabetes ([place])    Diverticulosis    ED (erectile dysfunction)    Exocrine pancreatic insufficiency    Fatty liver    GERD (gastroesophageal reflux disease)    HOH (hard of hearing)    Hyperlipidemia    Hypertension    Hypothyroidism    Internal hemorrhoids    Leg edema, left    Macular degeneration    Neuromuscular disorder (HCC)    Osteoarthritis    Peripheral neuropathy    Sleep apnea    cpap   Testosterone deficiency    Tremors of nervous system    Umbilical hernia    Past Surgical History:  Procedure Laterality Date   BACK SURGERY  2009   BACK SURGERY  09/2014   Dr Marykay Lex   CATARACT EXTRACTION, BILATERAL  12/2016   COLONOSCOPY     ESOPHAGOGASTRODUODENOSCOPY (EGD) WITH PROPOFOL N/A 09/16/2017   Procedure: ESOPHAGOGASTRODUODENOSCOPY (EGD) WITH PROPOFOL;  Surgeon: Yetta Flock, MD;  Location: [facility];  Service: Gastroenterology;  Laterality: N/A;   LUMBAR LAMINECTOMY/ DECOMPRESSION WITH MET-RX  2014   TONSILLECTOMY  1062   UMBILICAL HERNIA REPAIR     Patient Active Problem List   Diagnosis Date Noted   Genetic testing 05/01/2018   Adenomatous duodenal polyp    Diarrhea  05/05/2015   GERD 12/01/2007   DYSPHAGIA UNSPECIFIED 12/01/2007   INTERNAL HEMORRHOIDS 03/06/2007   BARRETTS ESOPHAGUS 01/29/2007   DIVERTICULOSIS, COLON 08/01/2005   COLONIC POLYPS, ADENOMATOUS 07/09/2002    PCP: Aletha Halim., PA-C  REFERRING PROVIDER: Doran Clay, MD   REFERRING DIAG:   M48.00  (ICD-10-CM) - Spinal stenosis, site unspecified           G62.9 (ICD-10-CM) - Polyneuropathy, unspecified M19.90 (ICD-10-CM) - Unspecified osteoarthritis, unspecified site           M48.061 (ICD-10-CM) - Spinal stenosis, lumbar 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  Pt underwent posterior T3-Iliac Wing revision extension instrumentation and fusion with T10 Laminectomy, pedicle fixation and rods 13 or more vertebral segments, pelvic fixation other than sacrum, and laminectomy, facetectomy, foraminotomy on 1 lumbar segment on 10/03/2021.  PT records in [place] indicated T9-L2 revision extension instrumentation and fusion.  Pt has B/L/T precautions; no bending, lifting, and twisting.  -Pt is 16 weeks and 2 days post op.    -Pt denies any adverse effects after prior Rx.  Pt states he walked for 25 mins with short intermittent rest breaks ( no > 30 sec) on the track upstairs.  He reports no increased pain after walking.  He states he was feeling better with bed mobility though has  increased pain now.  He states he may be having increased pain with bed mobility due to using his massager/theragun on his sides.    -He reports improved mobility including bed mobility and performance of transfers including getting off the couch and getting into the car.  Pt also reports improved ambulation and standing tolerance.  Pt has not performed his HEP since last visit but was performing them before last visit.  Pt states his R foot drop interferes with ambulation.     PERTINENT HISTORY:  -posterior T3-Iliac Wing revision extension instrumentation and fusion with T10 Laminectomy, pedicle fixation and rods 13 or more vertebral segments, pelvic fixation other than sacrum, and laminectomy, facetectomy, foraminotomy on 1 lumbar segment on 10/03/2021. -Hx of 3 prior back surgeries including fusion and decompression; 2016 lumbar fusion -Neuropathy, degenerative scoliosis, R foot drop,  Warnicke Korsakov syndrome, Macular degeneration, DM, depression, and anxiety   PAIN:  NPRS scale: No pain at rest and 3/10 pain with ambulation.    PRECAUTIONS: Back and Fall; Pt had fusion and laminectomy.  He has bending, lifting, and twisting precautions.  Falls due to neuropathy.  Pt has R foot drop.  Multiple prior back surgeries.   FALLS:  Has patient fallen in last 6 months? Yes, Number of falls: 3  LIVING ENVIRONMENT: Lives with: lives alone Lives in: 1 level home Stairs: 3-4 steps with rail to enter the front Has following equipment at home: Single point cane, Walker - 2 wheeled, and Environmental consultant - 4 wheeled  OCCUPATION: Pt is retired.  PLOF: Independent; Pt used SPC on occasion especially at night.  He was able to ambulate community distance without significant difficulty. He was careful with stairs.  Pt states he has had falls since 2019 due to neuropathy.  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  Pt is a member at [employer] and was working out by  swimming and using some of the machines in the fitness center.     PATIENT GOALS wants to be able to travel, improve ambulation, back and LE strength. Pt would like to get in the water when he can.    OBJECTIVE:   DIAGNOSTIC FINDINGS:  X rays, CT scan, and MRI before surgery. brain CT and MRI which showed no acute intracranial abnormalities.  TODAY'S TREATMENT     Therapeutic Exercise:   -Reviewed current function, HEP compliance, pain levels, and response to prior Rx.  -Educated pt concerning TrA contraction performance and palpation.  -Pt performed:  NuStep at L2 with UE/LE x 5 mins  Standing rows with with GTB with TrA 2x10 reps  Standing shoulder extension with RTB with TrA 2x10  reps Supine alt LE ext with TrA while holding 2# 2x10  Supine alt UE/LE with TrA 2x10 reps  Supine HS manual stretch with passive AP's for nn flossing 2 x 20 reps  Supine bridge 2x10 reps  S/L clams 2x10 reps   Supine SLR x10 reps bilat   Therapeutic Activities:  Pt ambulated with verbal, visual, and tactile cuing to correct posture.   Pt stood in front of mirror and received cues to improve posture.   PATIENT EDUCATION:  Education details:  Posture in standing and with gait. exercise form. Person educated: Patient Education method: Explanation, demonstration, verbal and tactile cues,  Education comprehension: verbalized understanding and needs further education, verbal and tactile cues required, returned demonstration   HOME EXERCISE PROGRAM: Access Code: [email or web address] URL: [email or web address] Date: 01/23/2022 Prepared by: Ronny Flurry  Exercises - Supine Transversus Abdominis Bracing - Hands on Stomach  - 3 x daily - 7 x weekly - 1-2 sets - 10 reps - Supine March  - 1-2 x daily - 7 x weekly - 2 sets - 10 reps - Supine Core Control with Heel Slide  - 1 x daily - 7 x weekly - 2 sets - 10 reps - Seated Ankle Pumps on Table  - 2-3 x daily - 7 x weekly - 2 sets - 10 reps -  Hooklying Clamshell with Resistance  - 1 x daily - 4-5 x weekly - 2 sets - 10 reps - Seated Knee Extension with Resistance  - 1 x daily - 3-4 x weekly - 2 sets - 10 reps - Standing Shoulder Row with Anchored Resistance  - 1 x daily - 4-5 x weekly - 2 sets - 10 reps - Shoulder extension with resistance - Neutral  - 1 x daily - 4-5 x weekly - 2 sets - 10 reps - Supine Hamstring Stretch with Strap  - 2 x daily - 7 x weekly - 2 reps - 20-30 seconds hold     ASSESSMENT:  CLINICAL IMPRESSION: Pt is progressing well with functional mobility, strength, and protocol.  Pt was able to increase ambulation duration on indoor track after prior PT Rx without adverse effects.  Pt continues to have a lean with standing which worsens with gait.  Pt demonstrates improved posture with standing and with gait with cuing and focus.  Pt is improving with core strength as evidenced by performance and tolerance of exercises.  Pt requires cuing for correct form with exercises.  Pt did have some pain with rolling on table which pt states may be from his massager/theragun.  He responded well to Rx having no c/o's after Rx.  Pt should benefit from continued skilled PT services per protocol to address impairments and goals and improve overall function.         OBJECTIVE IMPAIRMENTS Abnormal gait, decreased activity tolerance, decreased balance, decreased endurance, decreased mobility, difficulty walking, decreased ROM, decreased strength, hypomobility, impaired flexibility, postural dysfunction, and pain.   ACTIVITY LIMITATIONS cleaning, community activity, laundry, shopping, and ambulation .   PERSONAL FACTORS 3+ comorbidities: Hx of prior back surgeries including lumbar fusion, Neuropathy, R foot drop,  Macular degeneration, and DM   are also affecting patient's functional outcome.    REHAB POTENTIAL: Good  CLINICAL DECISION MAKING: Stable/uncomplicated  EVALUATION COMPLEXITY: Low   GOALS:  SHORT TERM  GOALS:  STG Name Target Date Goal status  1 Pt will be independent and compliant with HEP for improved pain, strength, mobility, and function.  Baseline:  02/15/2022  INITIAL  2 Pt will demo improved gait speed, posture, and bilat step length with gait for improved community ambulation.  Baseline:  02/22/2022 INITIAL  3 Pt will be able to perform his normal daily transfers without difficulty.   Baseline: 01/08/2022 INITIAL  4 Pt will progress his walking program without adverse effects for improved tolerance with activity and ambulation.   Baseline: 02/22/2022  INITIAL  5 Pt will demo good form with a log roll for improved bed mobility Baseline: 02/15/2022 INITIAL             LONG TERM GOALS:   LTG Name Target Date Goal status  1 Pt will be able to ambulate extended community distance with LRAD without significant difficulty and pain.  Baseline: 02/05/2022 INITIAL  2 Pt will be able to play music without significant pain.  Baseline: 02/05/2022 INITIAL  3 Pt will demo improved core strength by progressing with core exercises per protocol without adverse effects for improved tolerance to and performance of daily activities and functional mobility.  Baseline: 02/05/2022 INITIAL  4 Pt will be able to his perform normal ADLs, standing activities and appropriate household chores without significant pain or difficulty.  Baseline: 02/05/2022 INITIAL  5 Pt will be able to ambulate in grocery store and shop without significant pain.  Baseline: 02/05/2022 INITIAL             PLAN: PT FREQUENCY: 2 times per week  PT DURATION: 8 weeks  PLANNED INTERVENTIONS: Therapeutic exercises, Therapeutic activity, Neuromuscular re-education, Balance training, Gait training, Patient/Family education, Joint mobilization, Stair training, DME instructions, Aquatic Therapy, Dry Needling, Electrical stimulation, Cryotherapy, Moist heat, Taping, and Manual therapy  PLAN FOR NEXT SESSION:  cont with log rolling.  Cont  with core and LE strengthening per fusion protocol.    Selinda Michaels III PT, DPT 01/25/22 5:08 PM

## 2022-02-01 ENCOUNTER — Encounter (HOSPITAL_BASED_OUTPATIENT_CLINIC_OR_DEPARTMENT_OTHER): Payer: Medicare Other | Admitting: Physical Therapy

## 2022-02-05 ENCOUNTER — Ambulatory Visit (HOSPITAL_BASED_OUTPATIENT_CLINIC_OR_DEPARTMENT_OTHER): Payer: Medicare Other | Admitting: Physical Therapy

## 2022-02-07 ENCOUNTER — Encounter (HOSPITAL_BASED_OUTPATIENT_CLINIC_OR_DEPARTMENT_OTHER): Payer: Medicare Other | Admitting: Physical Therapy

## 2022-02-11 NOTE — Therapy (Signed)
OUTPATIENT PHYSICAL THERAPY TREATMENT / PROGRESS NOTE  Progress Note Reporting Period 12/18/2021 to 02/12/2022  See note below for Objective Data and Assessment of Progress/Goals.        Patient Name: Robert Castro MRN: [id number] DOB:03/15/1951, 71 y.o., male Today's Date: 02/13/2022   PT End of Session - 02/12/22 1714     Visit Number 12    Number of Visits 24    Date for PT Re-Evaluation 03/26/22    Authorization Type Medicare A and B-  KX at visit 15    PT Start Time 1615    PT Stop Time 1700    PT Time Calculation (min) 45 min    Activity Tolerance Patient tolerated treatment well    Behavior During Therapy WFL for tasks assessed/performed                       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)    Diverticulosis    ED (erectile dysfunction)    Exocrine pancreatic insufficiency    Fatty liver    GERD (gastroesophageal reflux disease)    HOH (hard of hearing)    Hyperlipidemia    Hypertension    Hypothyroidism    Internal hemorrhoids    Leg edema, left    Macular degeneration    Neuromuscular disorder ([facility])    Osteoarthritis    Peripheral neuropathy    Sleep apnea    cpap   Testosterone deficiency    Tremors of nervous system    Umbilical hernia    Past Surgical History:  Procedure Laterality Date   BACK SURGERY  2009   BACK SURGERY  09/2014   Dr Marykay Lex   CATARACT EXTRACTION, BILATERAL  12/2016   COLONOSCOPY     ESOPHAGOGASTRODUODENOSCOPY (EGD) WITH PROPOFOL N/A 09/16/2017   Procedure: ESOPHAGOGASTRODUODENOSCOPY (EGD) WITH PROPOFOL;  Surgeon: Yetta Flock, MD;  Location: [facility];  Service: Gastroenterology;  Laterality: N/A;   LUMBAR LAMINECTOMY/ DECOMPRESSION WITH MET-RX  2014    TONSILLECTOMY  2707   UMBILICAL HERNIA REPAIR     Patient Active Problem List   Diagnosis Date Noted   Genetic testing 05/01/2018   Adenomatous duodenal polyp    Diarrhea 05/05/2015   GERD 12/01/2007   DYSPHAGIA UNSPECIFIED 12/01/2007   INTERNAL HEMORRHOIDS 03/06/2007   BARRETTS ESOPHAGUS 01/29/2007   DIVERTICULOSIS, COLON 08/01/2005   COLONIC POLYPS, ADENOMATOUS 07/09/2002    PCP: Aletha Halim., PA-C  REFERRING PROVIDER: Doran Clay, MD   REFERRING DIAG:   M48.00  (ICD-10-CM) - Spinal stenosis, site unspecified           G62.9 (ICD-10-CM) - Polyneuropathy, unspecified M19.90 (ICD-10-CM) - Unspecified osteoarthritis, unspecified site           M48.061 (ICD-10-CM) - Spinal stenosis, lumbar 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  Pt underwent posterior T3-Iliac Wing revision extension instrumentation and fusion with T10 Laminectomy, pedicle fixation and rods 13 or more vertebral segments, pelvic fixation other than sacrum, and laminectomy, facetectomy, foraminotomy on 1 lumbar segment on 10/03/2021.  PT records in [place] indicated T9-L2 revision extension instrumentation and fusion.  Pt has B/L/T precautions; no bending, lifting, and twisting.  -Pt is 18 weeks and 6 days post op.    -Pt denies any adverse effects after prior Rx.  Pt reports MD was pleased when he  saw pt. -Pt reports improved mobility, car transfers, ambulation, bed mobility, and sit/stand transfers.  "My back doesn't bother me as much."  Pt states he still has some residual pain in his ribs with turning.  Pt reports minimal difficulty with transfers.  Pt hasn't used cane in approx 1 month. -Pt states he has minimal difficulty with dressing and no difficulty with showering.  Pt is limited with ambulation and has difficulty with ambulation.  He has difficulty with standing for long periods of time.  Pt reports minimal to moderate difficulty with daily act's, standing act's, and ambulating in grocery store.      PERTINENT HISTORY:  -posterior T3-Iliac Wing revision extension instrumentation and fusion with T10 Laminectomy, pedicle fixation and rods 13 or more vertebral segments, pelvic fixation other than sacrum, and laminectomy, facetectomy, foraminotomy on 1 lumbar segment on 10/03/2021. -Hx of 3 prior back surgeries including fusion and decompression; 2016 lumbar fusion -Neuropathy, degenerative scoliosis, R foot drop,  Warnicke Korsakov syndrome, Macular degeneration, DM, depression, and anxiety   PAIN:  NPRS scale: No pain at rest and 3/10 pain with ambulation.    PRECAUTIONS: Back and Fall; Pt had fusion and laminectomy.  He has bending, lifting, and twisting precautions.  Falls due to neuropathy.  Pt has R foot drop.  Multiple prior back surgeries.   FALLS:  Has patient fallen in last 6 months? Yes, Number of falls: 3  LIVING ENVIRONMENT: Lives with: lives alone Lives in: 1 level home Stairs: 3-4 steps with rail to enter the front Has following equipment at home: Single point cane, Walker - 2 wheeled, and Environmental consultant - 4 wheeled  OCCUPATION: Pt is retired.  PLOF: Independent; Pt used SPC on occasion especially at night.  He was able to ambulate community distance without significant difficulty. He was careful with stairs.  Pt states he has had falls since 2019 due to neuropathy.  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  Pt is a member at [employer] and was working out by swimming and using some of the machines in the fitness center.     PATIENT GOALS wants to be able to travel, improve ambulation, back and LE strength. Pt would like to get in the water when he can.    OBJECTIVE:   DIAGNOSTIC FINDINGS:  X rays, CT scan, and MRI before surgery. brain CT and MRI which showed no acute intracranial abnormalities.  TODAY'S TREATMENT    Physical Performance Testing:          Assessed gait, ROM, strength, sit to stand transfers, pain level, and reported function for functional progress toward goals.  Pt completed FOTO.      PATIENT SURVEYS:  FOTO 71 which has improved from prior 50 with a goal of 52 at visit #12. Risk Adjusted Statistical FOTO* 38.     COGNITION:  WFL     LE AROM/PROM:   A/PROM Right 10/25/2021 RIGHT 02/12/2022 Left 10/25/2021 Left  Hip flexion        Hip extension        Hip abduction        Hip adduction        Hip internal rotation        Hip external rotation        Knee flexion        Knee extension  Lacking 25 deg with LAQ Lacking 20 deg with LAQ Lacking 20 deg with LAQ  Lacking 10 deg with LAQ  Ankle dorsiflexion 3-4/8  12   Ankle plantarflexion 50  40   Ankle inversion        Ankle eversion         (Blank rows = not tested)   LE Strength (MMT): PRIOR:                                                                            CURRENT:                                                             Knee (w/n available ROM)                                               Knee (w/n available ROM Extension:  R:  4/5 L:  4+/5                                                 5/5 bilat  Flexion (sitting):   4+/5, L:  4-/5                                         R: 5/5, L:  4/5 Ankle: (w/n available ROM)                                             Ankle: (w/n available ROM) DF:  R:  4-/5, L:   5/5                                                           R:  4/5, L:  5/5 PF (tested in [place]):  R:  WFL L:  weak  R: WFL  R:  WFL, L: weak, though improved                                                                                           Hip:                 Flexion:  5/5 bilat                 Abduction:  R:  <3/5, L:  4/5     FUNCTIONAL TESTS:  Pt is able to perform sit to stand transfers without UE assistance with improved ease and without difficulty.       GAIT: Assistive device utilized: none Comments: Pt with improved posture with gait though still has leftward lean.  He does have reduced leftward lean.  Pt demonstrates improved gait speed and step length bilat.  Pt has improved L foot clearance and decreased R foot clearance bilat.    Therapeutic Exercise:   -Reviewed current function, HEP compliance, pain levels, and response to prior Rx.  -Educated pt concerning TrA contraction performance and palpation.  -Pt performed:  Supine alt UE/LE with TrA 2x10 reps  Supine bridge 2x10 reps  Supine SLR x10 reps bilat    PATIENT EDUCATION:  Education details:  Posture in standing and with gait. exercise form.  Objective findings.  POC Person educated: Patient Education method: Explanation, demonstration, verbal and tactile cues,  Education comprehension: verbalized understanding and needs further education, verbal and tactile cues required, returned demonstration   HOME EXERCISE PROGRAM: Access Code: [email or web address] URL: [email or web address] Date: 01/23/2022 Prepared by: Ronny Flurry  Exercises - Supine Transversus Abdominis Bracing - Hands on Stomach  - 3 x daily - 7 x weekly - 1-2 sets - 10 reps - Supine March  - 1-2 x daily - 7 x weekly - 2 sets - 10 reps - Supine Core Control with Heel Slide  - 1 x daily - 7 x weekly - 2 sets - 10 reps - Seated Ankle Pumps on Table  - 2-3 x daily - 7 x weekly - 2 sets - 10 reps - Hooklying Clamshell with  Resistance  - 1 x daily - 4-5 x weekly - 2 sets - 10 reps - Seated Knee Extension with Resistance  - 1 x daily - 3-4 x weekly - 2 sets - 10 reps - Standing Shoulder Row with Anchored Resistance  - 1 x daily - 4-5 x weekly - 2 sets - 10 reps - Shoulder extension with resistance - Neutral  - 1 x daily - 4-5 x weekly - 2 sets - 10 reps - Supine Hamstring Stretch with Strap  - 2 x daily - 7 x weekly - 2 reps - 20-30 seconds hold     ASSESSMENT:  CLINICAL IMPRESSION: Pt is making very good progress in all areas including strength, gait, function, and tolerance to activity.  He demonstrates improved core strength by progression and performance of core exercises and improved LE strength as evidenced by MMT.  Pt demonstrates much improved quality of gait and gait speed.  He continues to have postural deficits with gait which improves with instruction  and focus.  Pt is ambulating without cane.  Though pt has been increasing his ambulation distance, he is still limited with ambulation. Pt hasn't tried to play music, but is planning on playing next week.  Pt has met STG's #1,2, and 5 and is progressing toward other goals.  He responded well to Rx having no c/o's after Rx.  Pt should benefit from continued skilled PT services per protocol to address impairments and goals and improve overall function.        OBJECTIVE IMPAIRMENTS Abnormal gait, decreased activity tolerance, decreased balance, decreased endurance, decreased mobility, difficulty walking, decreased ROM, decreased strength, hypomobility, impaired flexibility, postural dysfunction, and pain.   ACTIVITY LIMITATIONS cleaning, community activity, laundry, shopping, and ambulation .   PERSONAL FACTORS 3+ comorbidities: Hx of prior back surgeries including lumbar fusion, Neuropathy, R foot drop,  Macular degeneration, and DM   are also affecting patient's functional outcome.    REHAB POTENTIAL: Good  CLINICAL DECISION MAKING:  Stable/uncomplicated  EVALUATION COMPLEXITY: Low   GOALS:  SHORT TERM GOALS:  STG Name Target Date Goal status  1 Pt will be independent and compliant with HEP for improved pain, strength, mobility, and function.  Baseline:  02/15/2022  GOAL MET  2 Pt will demo improved gait speed, posture, and bilat step length with gait for improved community ambulation.  Baseline:  02/22/2022 GOAL MET  3 Pt will be able to perform his normal daily transfers without difficulty.   Baseline: 01/08/2022 PARTIALLY MET  4 Pt will progress his walking program without adverse effects for improved tolerance with activity and ambulation.   Baseline: 02/22/2022  ONGOING  5 Pt will demo good form with a log roll for improved bed mobility Baseline: 02/15/2022 GOAL MET             LONG TERM GOALS:   LTG Name Target Date Goal status  1 Pt will be able to ambulate extended community distance with LRAD without significant difficulty and pain.  Baseline: 03/26/2022 PROGRESSING  2 Pt will be able to play music without significant pain.  Baseline: 03/26/2022 HASN'T ATTEMPTED  3 Pt will demo improved core strength by progressing with core exercises per protocol without adverse effects for improved tolerance to and performance of daily activities and functional mobility.  Baseline: 03/26/2022 PROGRESSING  4 Pt will be able to his perform normal ADLs, standing activities and appropriate household chores without significant pain or difficulty.  Baseline: 03/26/2022 PARTIALLY MET  5 Pt will be able to ambulate in grocery store and shop without significant pain.  Baseline: 03/26/2022 PROGRESSING             PLAN: PT FREQUENCY: 2 times per week  PT DURATION: 4-6 weeks  PLANNED INTERVENTIONS: Therapeutic exercises, Therapeutic activity, Neuromuscular re-education, Balance training, Gait training, Patient/Family education, Joint mobilization, Stair training, DME instructions, Aquatic Therapy, Dry Needling, Electrical  stimulation, Cryotherapy, Moist heat, Taping, and Manual therapy  PLAN FOR NEXT SESSION:  Cont with core and LE strengthening per fusion protocol.    Selinda Michaels III PT, DPT 02/13/22 3:58 PM

## 2022-02-12 ENCOUNTER — Ambulatory Visit (HOSPITAL_BASED_OUTPATIENT_CLINIC_OR_DEPARTMENT_OTHER): Payer: Medicare Other | Admitting: Physical Therapy

## 2022-02-12 ENCOUNTER — Encounter (HOSPITAL_BASED_OUTPATIENT_CLINIC_OR_DEPARTMENT_OTHER): Payer: Self-pay | Admitting: Physical Therapy

## 2022-02-12 DIAGNOSIS — R262 Difficulty in walking, not elsewhere classified: Secondary | ICD-10-CM

## 2022-02-12 DIAGNOSIS — M6281 Muscle weakness (generalized): Secondary | ICD-10-CM

## 2022-02-12 DIAGNOSIS — M545 Low back pain, unspecified: Secondary | ICD-10-CM

## 2022-02-12 DIAGNOSIS — M546 Pain in thoracic spine: Secondary | ICD-10-CM

## 2022-02-15 ENCOUNTER — Ambulatory Visit (HOSPITAL_BASED_OUTPATIENT_CLINIC_OR_DEPARTMENT_OTHER): Payer: Medicare Other | Admitting: Physical Therapy

## 2022-02-15 DIAGNOSIS — M546 Pain in thoracic spine: Secondary | ICD-10-CM | POA: Diagnosis not present

## 2022-02-15 DIAGNOSIS — M6281 Muscle weakness (generalized): Secondary | ICD-10-CM

## 2022-02-15 DIAGNOSIS — R262 Difficulty in walking, not elsewhere classified: Secondary | ICD-10-CM

## 2022-02-15 DIAGNOSIS — M545 Low back pain, unspecified: Secondary | ICD-10-CM

## 2022-02-15 NOTE — Therapy (Incomplete)
OUTPATIENT PHYSICAL THERAPY TREATMENT / PROGRESS NOTE  Progress Note Reporting Period 12/18/2021 to 02/12/2022  See note below for Objective Data and Assessment of Progress/Goals.        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    stenosis and arthritis   Depression    Diabetes ([place])    Diverticulosis    ED (erectile dysfunction)    Exocrine pancreatic insufficiency    Fatty liver    GERD (gastroesophageal reflux disease)    HOH (hard of hearing)    Hyperlipidemia    Hypertension    Hypothyroidism    Internal hemorrhoids    Leg edema, left    Macular degeneration    Neuromuscular disorder (HCC)    Osteoarthritis    Peripheral neuropathy    Sleep apnea    cpap   Testosterone deficiency    Tremors of nervous system    Umbilical hernia    Past Surgical History:  Procedure Laterality Date   BACK SURGERY  2009   BACK SURGERY  09/2014   Dr Marykay Lex   CATARACT EXTRACTION, BILATERAL  12/2016   COLONOSCOPY     ESOPHAGOGASTRODUODENOSCOPY (EGD) WITH PROPOFOL N/A 09/16/2017   Procedure: ESOPHAGOGASTRODUODENOSCOPY (EGD) WITH PROPOFOL;  Surgeon: Yetta Flock, MD;  Location: [facility];  Service: Gastroenterology;  Laterality: N/A;   LUMBAR LAMINECTOMY/ DECOMPRESSION WITH MET-RX  2014   TONSILLECTOMY  4098   UMBILICAL HERNIA REPAIR     Patient Active Problem List   Diagnosis Date Noted   Genetic testing 05/01/2018   Adenomatous duodenal polyp    Diarrhea 05/05/2015   GERD 12/01/2007   DYSPHAGIA UNSPECIFIED 12/01/2007   INTERNAL HEMORRHOIDS 03/06/2007   BARRETTS ESOPHAGUS  01/29/2007   DIVERTICULOSIS, COLON 08/01/2005   COLONIC POLYPS, ADENOMATOUS 07/09/2002    PCP: Aletha Halim., PA-C  REFERRING PROVIDER: Doran Clay, MD   REFERRING DIAG:   M48.00  (ICD-10-CM) - Spinal stenosis, site unspecified           G62.9 (ICD-10-CM) - Polyneuropathy, unspecified M19.90 (ICD-10-CM) - Unspecified osteoarthritis, unspecified site           M48.061 (ICD-10-CM) - Spinal stenosis, lumbar region without neurogenic claudication            M62.81 (ICD-10-CM) - Muscle weakness (generalized)  THERAPY DIAG:  No diagnosis found.  ONSET DATE: October 2022  SUBJECTIVE:  SUBJECTIVE STATEMENT: -Pt has a hx of 3 prior back surgeries including fusion and decompression.  Pt underwent posterior T3-Iliac Wing revision extension instrumentation and fusion with T10 Laminectomy, pedicle fixation and rods 13 or more vertebral segments, pelvic fixation other than sacrum, and laminectomy, facetectomy, foraminotomy on 1 lumbar segment on 10/03/2021.  PT records in [place] indicated T9-L2 revision extension instrumentation and fusion.  Pt has B/L/T precautions; no bending, lifting, and twisting.  -Pt is 19 weeks and 2 days post op.    -Pt denies any adverse effects after prior Rx.  Pt reports MD was pleased when he saw pt. -Pt reports improved mobility, car transfers, ambulation, bed mobility, and sit/stand transfers.  "My back doesn't bother me as much."  Pt states he still has some residual pain in his ribs with turning.  Pt reports minimal difficulty with transfers.  Pt hasn't used cane in approx 1 month. -Pt states he has minimal difficulty with dressing and no difficulty with showering.  Pt is limited with ambulation and has difficulty with ambulation.  He has difficulty with standing for long periods  of time.  Pt reports minimal to moderate difficulty with daily act's, standing act's, and ambulating in grocery store.      PERTINENT HISTORY:  -posterior T3-Iliac Wing revision extension instrumentation and fusion with T10 Laminectomy, pedicle fixation and rods 13 or more vertebral segments, pelvic fixation other than sacrum, and laminectomy, facetectomy, foraminotomy on 1 lumbar segment on 10/03/2021. -Hx of 3 prior back surgeries including fusion and decompression; 2016 lumbar fusion -Neuropathy, degenerative scoliosis, R foot drop,  Warnicke Korsakov syndrome, Macular degeneration, DM, depression, and anxiety   PAIN:  NPRS scale: No pain at rest and 3/10 pain with ambulation.    PRECAUTIONS: Back and Fall; Pt had fusion and laminectomy.  He has bending, lifting, and twisting precautions.  Falls due to neuropathy.  Pt has R foot drop.  Multiple prior back surgeries.   FALLS:  Has patient fallen in last 6 months? Yes, Number of falls: 3  LIVING ENVIRONMENT: Lives with: lives alone Lives in: 1 level home Stairs: 3-4 steps with rail to enter the front Has following equipment at home: Single point cane, Walker - 2 wheeled, and Environmental consultant - 4 wheeled  OCCUPATION: Pt is retired.  PLOF: Independent; Pt used SPC on occasion especially at night.  He was able to ambulate community distance without significant difficulty. He was careful with stairs.  Pt states he has had falls since 2019 due to neuropathy.  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  Pt is a member at [employer] and was working out by swimming and using some of the machines in the fitness center.     PATIENT GOALS wants to be able to travel, improve ambulation, back and LE strength. Pt would like to get in the water when he can.    OBJECTIVE:   DIAGNOSTIC FINDINGS:  X rays, CT scan, and MRI before surgery. brain CT and MRI which showed no acute intracranial  abnormalities.  TODAY'S TREATMENT    Physical Performance Testing:          Assessed gait, ROM, strength, sit to stand transfers, pain level, and reported function for functional progress toward goals.  Pt completed FOTO.      PATIENT SURVEYS:  FOTO 47 which has improved from prior 40 with a goal of 52 at visit #12. Risk Adjusted Statistical FOTO* 38.     COGNITION:  WFL     LE AROM/PROM:   A/PROM Right 10/25/2021 RIGHT 02/12/2022 Left 10/25/2021 Left  Hip flexion        Hip extension        Hip abduction        Hip adduction        Hip internal rotation        Hip external rotation        Knee flexion        Knee extension  Lacking 25 deg with LAQ Lacking 20 deg with LAQ Lacking 20 deg with LAQ  Lacking 10 deg with LAQ  Ankle dorsiflexion 3-4/8  12   Ankle plantarflexion 50  40   Ankle inversion        Ankle eversion         (Blank rows = not tested)   LE Strength (MMT): PRIOR:                                                                            CURRENT:                                                             Knee (w/n available ROM)                                               Knee (w/n available ROM Extension:  R:  4/5 L:  4+/5                                                 5/5 bilat  Flexion (sitting):   4+/5, L:  4-/5                                         R: 5/5, L:  4/5 Ankle: (w/n available ROM)                                             Ankle: (w/n available ROM) DF:  R:  4-/5, L:  5/5                                                           R:  4/5, L:  5/5 PF (tested in [place]):  R:  WFL L:  weak  R: WFL  R:  WFL, L: weak, though improved                                                                                           Hip:                 Flexion:  5/5 bilat                 Abduction:  R:  <3/5, L:  4/5     FUNCTIONAL TESTS:  Pt is able to perform sit to stand transfers without UE assistance with improved ease and without  difficulty.       GAIT: Assistive device utilized: none Comments: Pt with improved posture with gait though still has leftward lean.  He does have reduced leftward lean.  Pt demonstrates improved gait speed and step length bilat.  Pt has improved L foot clearance and decreased R foot clearance bilat.    Therapeutic Exercise:   -Reviewed current function, HEP compliance, pain levels, and response to prior Rx.  -Educated pt concerning TrA contraction performance and palpation.  -Pt performed:  Nu-step x 5 mins with UE/Les Supine alt UE/LE with TrA 2x10 reps  Supine bridge 2x10 reps  Supine alt LE ext with TrA while holding 3# DB  S/L clams with RTB 2x10 bilat  Supine SLR x10 reps bilat    Pt received supine manual HS strength with passive AP's 2x30 sec bilat    PATIENT EDUCATION:  Education details:  Posture in standing and with gait. exercise form.  Objective findings.  POC Person educated: Patient Education method: Explanation, demonstration, verbal and tactile cues,  Education comprehension: verbalized understanding and needs further education, verbal and tactile cues required, returned demonstration   HOME EXERCISE PROGRAM: Access Code: [email or web address] URL: [email or web address] Date: 01/23/2022 Prepared by: Ronny Flurry  Exercises - Supine Transversus Abdominis Bracing - Hands on Stomach  - 3 x daily - 7 x weekly - 1-2 sets - 10 reps - Supine March  - 1-2 x daily - 7 x weekly - 2 sets - 10 reps - Supine Core Control with Heel Slide  - 1 x daily - 7 x weekly - 2 sets - 10 reps - Seated Ankle Pumps on Table  - 2-3 x daily - 7 x weekly - 2 sets - 10 reps - Hooklying Clamshell with Resistance  - 1 x daily - 4-5 x weekly - 2 sets - 10 reps - Seated Knee Extension with Resistance  - 1 x daily - 3-4 x weekly - 2 sets - 10 reps - Standing Shoulder Row with Anchored Resistance  - 1 x daily - 4-5 x weekly - 2 sets - 10 reps - Shoulder extension with resistance - Neutral   - 1 x daily - 4-5 x weekly - 2 sets - 10 reps - Supine Hamstring Stretch with Strap  - 2 x daily - 7 x weekly - 2 reps - 20-30 seconds hold     ASSESSMENT:  CLINICAL IMPRESSION: Pt is making very good progress in all areas including strength, gait, function, and tolerance to activity.  He demonstrates improved core  strength by progression and performance of core exercises and improved LE strength as evidenced by MMT.  Pt demonstrates much improved quality of gait and gait speed.  He continues to have postural deficits with gait which improves with instruction and focus.  Pt is ambulating without cane.  Though pt has been increasing his ambulation distance, he is still limited with ambulation. Pt hasn't tried to play music, but is planning on playing next week.  Pt has met STG's #1,2, and 5 and is progressing toward other goals.  He responded well to Rx having no c/o's after Rx.  Pt should benefit from continued skilled PT services per protocol to address impairments and goals and improve overall function.        OBJECTIVE IMPAIRMENTS Abnormal gait, decreased activity tolerance, decreased balance, decreased endurance, decreased mobility, difficulty walking, decreased ROM, decreased strength, hypomobility, impaired flexibility, postural dysfunction, and pain.   ACTIVITY LIMITATIONS cleaning, community activity, laundry, shopping, and ambulation .   PERSONAL FACTORS 3+ comorbidities: Hx of prior back surgeries including lumbar fusion, Neuropathy, R foot drop,  Macular degeneration, and DM   are also affecting patient's functional outcome.    REHAB POTENTIAL: Good  CLINICAL DECISION MAKING: Stable/uncomplicated  EVALUATION COMPLEXITY: Low   GOALS:  SHORT TERM GOALS:  STG Name Target Date Goal status  1 Pt will be independent and compliant with HEP for improved pain, strength, mobility, and function.  Baseline:  02/15/2022  GOAL MET  2 Pt will demo improved gait speed, posture, and bilat  step length with gait for improved community ambulation.  Baseline:  02/22/2022 GOAL MET  3 Pt will be able to perform his normal daily transfers without difficulty.   Baseline: 01/08/2022 PARTIALLY MET  4 Pt will progress his walking program without adverse effects for improved tolerance with activity and ambulation.   Baseline: 02/22/2022  ONGOING  5 Pt will demo good form with a log roll for improved bed mobility Baseline: 02/15/2022 GOAL MET             LONG TERM GOALS:   LTG Name Target Date Goal status  1 Pt will be able to ambulate extended community distance with LRAD without significant difficulty and pain.  Baseline: 03/26/2022 PROGRESSING  2 Pt will be able to play music without significant pain.  Baseline: 03/26/2022 HASN'T ATTEMPTED  3 Pt will demo improved core strength by progressing with core exercises per protocol without adverse effects for improved tolerance to and performance of daily activities and functional mobility.  Baseline: 03/26/2022 PROGRESSING  4 Pt will be able to his perform normal ADLs, standing activities and appropriate household chores without significant pain or difficulty.  Baseline: 03/26/2022 PARTIALLY MET  5 Pt will be able to ambulate in grocery store and shop without significant pain.  Baseline: 03/26/2022 PROGRESSING             PLAN: PT FREQUENCY: 2 times per week  PT DURATION: 4-6 weeks  PLANNED INTERVENTIONS: Therapeutic exercises, Therapeutic activity, Neuromuscular re-education, Balance training, Gait training, Patient/Family education, Joint mobilization, Stair training, DME instructions, Aquatic Therapy, Dry Needling, Electrical stimulation, Cryotherapy, Moist heat, Taping, and Manual therapy  PLAN FOR NEXT SESSION:  Cont with core and LE strengthening per fusion protocol.    Selinda Michaels III PT, DPT 02/15/22 4:28 PM

## 2022-02-16 ENCOUNTER — Encounter (HOSPITAL_BASED_OUTPATIENT_CLINIC_OR_DEPARTMENT_OTHER): Payer: Self-pay | Admitting: Physical Therapy

## 2022-02-19 ENCOUNTER — Ambulatory Visit (HOSPITAL_BASED_OUTPATIENT_CLINIC_OR_DEPARTMENT_OTHER): Payer: Medicare Other | Admitting: Physical Therapy

## 2022-02-19 DIAGNOSIS — M6281 Muscle weakness (generalized): Secondary | ICD-10-CM

## 2022-02-19 DIAGNOSIS — R262 Difficulty in walking, not elsewhere classified: Secondary | ICD-10-CM

## 2022-02-19 DIAGNOSIS — M546 Pain in thoracic spine: Secondary | ICD-10-CM | POA: Diagnosis not present

## 2022-02-19 DIAGNOSIS — M545 Low back pain, unspecified: Secondary | ICD-10-CM

## 2022-02-19 NOTE — Therapy (Addendum)
OUTPATIENT PHYSICAL THERAPY TREATMENT      Patient Name: Robert Castro MRN: [id number] DOB:05-Apr-1951, 71 y.o., male Today's Date: 02/20/2022   PT End of Session - 02/19/22 1653     Visit Number 14    Number of Visits 24    Date for PT Re-Evaluation 03/26/22    Authorization Type Medicare A and B-  KX at visit 15    PT Start Time 1633    PT Stop Time 1650    PT Time Calculation (min) 17 min    Activity Tolerance Patient tolerated treatment well    Behavior During Therapy WFL for tasks assessed/performed                        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    stenosis and arthritis   Depression    Diabetes ([place])    Diverticulosis    ED (erectile dysfunction)    Exocrine pancreatic insufficiency    Fatty liver    GERD (gastroesophageal reflux disease)    HOH (hard of hearing)    Hyperlipidemia    Hypertension    Hypothyroidism    Internal hemorrhoids    Leg edema, left    Macular degeneration    Neuromuscular disorder (HCC)    Osteoarthritis    Peripheral neuropathy    Sleep apnea    cpap   Testosterone deficiency    Tremors of nervous system    Umbilical hernia    Past Surgical History:  Procedure Laterality Date   BACK SURGERY  2009   BACK SURGERY  09/2014   Dr Marykay Lex   CATARACT EXTRACTION, BILATERAL  12/2016   COLONOSCOPY     ESOPHAGOGASTRODUODENOSCOPY (EGD) WITH PROPOFOL N/A 09/16/2017   Procedure: ESOPHAGOGASTRODUODENOSCOPY (EGD) WITH PROPOFOL;  Surgeon: Yetta Flock, MD;  Location: [facility];  Service: Gastroenterology;  Laterality: N/A;   LUMBAR LAMINECTOMY/ DECOMPRESSION WITH MET-RX  2014   TONSILLECTOMY  3664   UMBILICAL HERNIA REPAIR     Patient Active Problem List   Diagnosis Date Noted   Genetic testing 05/01/2018   Adenomatous  duodenal polyp    Diarrhea 05/05/2015   GERD 12/01/2007   DYSPHAGIA UNSPECIFIED 12/01/2007   INTERNAL HEMORRHOIDS 03/06/2007   BARRETTS ESOPHAGUS 01/29/2007   DIVERTICULOSIS, COLON 08/01/2005   COLONIC POLYPS, ADENOMATOUS 07/09/2002    PCP: Aletha Halim., PA-C  REFERRING PROVIDER: Doran Clay, MD   REFERRING DIAG:   M48.00  (ICD-10-CM) - Spinal stenosis, site unspecified           G62.9 (ICD-10-CM) - Polyneuropathy, unspecified M19.90 (ICD-10-CM) - Unspecified osteoarthritis, unspecified site           M48.061 (ICD-10-CM) - Spinal stenosis, lumbar 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  Pt underwent posterior T3-Iliac Wing revision extension instrumentation and fusion with T10 Laminectomy, pedicle fixation and rods 13 or more vertebral segments, pelvic fixation other than sacrum, and laminectomy, facetectomy, foraminotomy on 1 lumbar segment on 10/03/2021.  PT records in [place] indicated T9-L2 revision extension instrumentation and fusion.  Pt has B/L/T precautions; no bending, lifting, and twisting.  -Pt denies any adverse effects after prior Rx. -Pt arrived 30 mins late to Rx.  PT explained to pt that his Rx would only be 15 mins and pt wanted to continue with Rx.  Pt is planning to play music this week and will only have 1 appt this week due to playing in [place].   Pt states it was the easiest for him getting in/out of bed last night and this AM since surgery.        PERTINENT HISTORY:  -posterior T3-Iliac Wing revision extension instrumentation and fusion with T10 Laminectomy, pedicle fixation and rods 13 or more vertebral segments, pelvic fixation other than sacrum, and laminectomy, facetectomy, foraminotomy on 1 lumbar segment on 10/03/2021. -Hx of 3 prior back surgeries including fusion and decompression; 2016 lumbar fusion -Neuropathy, degenerative scoliosis, R foot drop,  Warnicke Korsakov syndrome, Macular degeneration, DM, depression, and anxiety   PAIN:  NPRS scale: "Not really any pain today"   PRECAUTIONS: Back and Fall; Pt had fusion and laminectomy.  He has bending, lifting, and twisting precautions.  Falls due to neuropathy.  Pt has R foot drop.  Multiple prior back surgeries.   FALLS:  Has patient fallen in last 6 months? Yes, Number of falls: 3  LIVING ENVIRONMENT: Lives with: lives alone Lives in: 1 level home Stairs: 3-4 steps with rail to enter the front Has following equipment at home: Single point cane, Walker - 2 wheeled, and Environmental consultant - 4 wheeled  OCCUPATION: Pt is retired.  PLOF: Independent; Pt used SPC on occasion especially at night.  He was able to ambulate community distance without significant difficulty. He was careful with stairs.  Pt states he has had falls since 2019 due to neuropathy.  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  Pt is a member at [employer] and was working out by swimming and using some of the machines in the fitness center.     PATIENT GOALS wants to be able to travel, improve ambulation, back and LE strength. Pt would like to get in the water when he can.    OBJECTIVE:   DIAGNOSTIC FINDINGS:  X rays, CT scan, and MRI before surgery. brain CT and MRI which showed no acute intracranial abnormalities.  TODAY'S TREATMENT     Therapeutic  Exercise:   -Reviewed current function, HEP compliance, pain levels, and response to prior Rx.  -Pt performed:  Nu-step x 3 mins at L5 with UE/Les         Supine bridge x10 reps  Standing rows with retraction with TrA with GTB and BTB x10 each  Standing shoulder ext with retraction with TrA with GTB 2x10  Standing horiz abd with TrA with YTB 2x10 reps   -Pt received supine manual HS strength with passive AP's 2x30 sec bilat    PATIENT EDUCATION:  Education details:  Posture in standing and with gait. exercise form.  Objective findings.  POC Person educated: Patient Education method: Explanation, demonstration, verbal and tactile cues,  Education comprehension: verbalized understanding  and needs further education, verbal and tactile cues required, returned demonstration   HOME EXERCISE PROGRAM: Access Code: [email or web address] URL: [email or web address] Date: 01/23/2022 Prepared by: Ronny Flurry  Exercises - Supine Transversus Abdominis Bracing - Hands on Stomach  - 3 x daily - 7 x weekly - 1-2 sets - 10 reps - Supine March  - 1-2 x daily - 7 x weekly - 2 sets - 10 reps - Supine Core Control with Heel Slide  - 1 x daily - 7 x weekly - 2 sets - 10 reps - Seated Ankle Pumps on Table  - 2-3 x daily - 7 x weekly - 2 sets - 10 reps - Hooklying Clamshell with Resistance  - 1 x daily - 4-5 x weekly - 2 sets - 10 reps - Seated Knee Extension with Resistance  - 1 x daily - 3-4 x weekly - 2 sets - 10 reps - Standing Shoulder Row with Anchored Resistance  - 1 x daily - 4-5 x weekly - 2 sets - 10 reps - Shoulder extension with resistance - Neutral  - 1 x daily - 4-5 x weekly - 2 sets - 10 reps - Supine Hamstring Stretch with Strap  - 2 x daily - 7 x weekly - 2 reps - 20-30 seconds hold     ASSESSMENT:  CLINICAL IMPRESSION: Rx is limited today due to pt arriving late to Rx.  He continues to progress well with strength, gait, function, and mobility.  Pt reports improved bed mobility  last night/this AM and states today was the best he's been able to get onto the table.  Pt is ambulating without cane.  He has not played music but plans to later this week. He performed exercises well with cuing for correct form.  He responded well to Rx having no increased pain after Rx.  Pt should benefit from continued skilled PT services per protocol to address impairments and goals and improve overall function.         OBJECTIVE IMPAIRMENTS Abnormal gait, decreased activity tolerance, decreased balance, decreased endurance, decreased mobility, difficulty walking, decreased ROM, decreased strength, hypomobility, impaired flexibility, postural dysfunction, and pain.   ACTIVITY LIMITATIONS cleaning, community activity, laundry, shopping, and ambulation .   PERSONAL FACTORS 3+ comorbidities: Hx of prior back surgeries including lumbar fusion, Neuropathy, R foot drop,  Macular degeneration, and DM   are also affecting patient's functional outcome.    REHAB POTENTIAL: Good  CLINICAL DECISION MAKING: Stable/uncomplicated  EVALUATION COMPLEXITY: Low   GOALS:  SHORT TERM GOALS:  STG Name Target Date Goal status  1 Pt will be independent and compliant with HEP for improved pain, strength, mobility, and function.  Baseline:  02/15/2022  GOAL MET  2 Pt will demo improved gait speed, posture, and bilat step length with gait for improved community ambulation.  Baseline:  02/22/2022 GOAL MET  3 Pt will be able to perform his normal daily transfers without difficulty.   Baseline: 01/08/2022 PARTIALLY MET  4 Pt will progress his walking program without adverse effects for improved tolerance with activity and ambulation.   Baseline: 02/22/2022  ONGOING  5 Pt will demo good form with a log roll for improved bed mobility Baseline: 02/15/2022 GOAL MET             LONG TERM GOALS:   LTG Name Target Date Goal status  1 Pt will be able to ambulate extended community distance with LRAD without  significant difficulty and pain.  Baseline: 03/26/2022 PROGRESSING  2 Pt will be able to play music without significant pain.  Baseline: 03/26/2022 HASN'T ATTEMPTED  3 Pt will demo improved core strength by progressing with core exercises per protocol without adverse effects for improved tolerance to and performance of daily activities and functional mobility.  Baseline: 03/26/2022 PROGRESSING  4 Pt will be able to his perform normal ADLs, standing activities and appropriate household chores without significant pain or difficulty.  Baseline: 03/26/2022 PARTIALLY MET  5 Pt will be able to ambulate in grocery store and shop without significant pain.  Baseline: 03/26/2022 PROGRESSING             PLAN: PT FREQUENCY: 2 times per week  PT DURATION: 4-6 weeks  PLANNED INTERVENTIONS: Therapeutic exercises, Therapeutic activity, Neuromuscular re-education, Balance training, Gait training, Patient/Family education, Joint mobilization, Stair training, DME instructions, Aquatic Therapy, Dry Needling, Electrical stimulation, Cryotherapy, Moist heat, Taping, and Manual therapy  PLAN FOR NEXT SESSION:  Cont with core and LE strengthening per fusion protocol.    Selinda Michaels III PT, DPT 02/20/22 10:53 PM  PHYSICAL THERAPY DISCHARGE SUMMARY  Visits from Start of Care: 14  Current functional level related to goals / functional outcomes: Pt was making good progress in PT, though unable to assess current functional status due to pt not being present at discharge.   Remaining deficits: Unable to assess   Education / Equipment: Pt has a HEP.   Patient was seen in PT from 10/25/21 to 02/19/22.  Pt was progressing well with strength, gait, function, and mobility.  He cancelled his following PT appointments due to a medical issue that was going on.  Pt called PT and requested PT to give him a call back.  PT called pt and left a message though did not hear back from pt.  Pt will be considered discharged at this  time due to another medical issue.    Selinda Michaels III PT, DPT 08/13/22 11:03 AM

## 2022-02-20 ENCOUNTER — Encounter (HOSPITAL_BASED_OUTPATIENT_CLINIC_OR_DEPARTMENT_OTHER): Payer: Self-pay | Admitting: Physical Therapy

## 2022-02-22 ENCOUNTER — Encounter (HOSPITAL_BASED_OUTPATIENT_CLINIC_OR_DEPARTMENT_OTHER): Payer: Medicare Other | Admitting: Physical Therapy

## 2022-02-23 ENCOUNTER — Ambulatory Visit (INDEPENDENT_AMBULATORY_CARE_PROVIDER_SITE_OTHER): Payer: Self-pay | Admitting: Plastic Surgery

## 2022-02-23 ENCOUNTER — Encounter: Payer: Self-pay | Admitting: Plastic Surgery

## 2022-02-23 VITALS — BP 128/81 | HR 70 | Ht 70.0 in | Wt 225.0 lb

## 2022-02-23 DIAGNOSIS — Z411 Encounter for cosmetic surgery: Secondary | ICD-10-CM

## 2022-02-24 NOTE — Progress Notes (Signed)
Referring Provider Aletha Halim., PA-C [address],  [place] [address]   CC:  Concerned about lower eyelids and cheek   Robert Castro is an 71 y.o. male.  HPI: Patient is a 71 year old who is concerned about his bags on his lower eyelids but also an area of fullness below the lid cheek junction.  He is considering cosmetic improvement.  He does note he has had difficult recovery from anesthesia after spinal surgery.    No Known Allergies  Outpatient Encounter Medications as of 02/23/2022  Medication Sig   Alpha Lipoic Acid 200 MG CAPS Take 200 mg by mouth every morning.   anastrozole (ARIMIDEX) 1 MG tablet Take 1 mg daily by mouth.   atorvastatin (LIPITOR) 20 MG tablet Take 20 mg by mouth daily.   chlorhexidine (PERIDEX) 0.12 % solution Use as directed 10 mLs in the mouth or throat 2 (two) times daily as needed.    Cholecalciferol 125 MCG (5000 UT) TABS Take by mouth daily.   Cyanocobalamin 5000 MCG CAPS Take 2 capsules by mouth in the morning and at bedtime.   donepezil (ARICEPT) 10 MG tablet Take 10 mg by mouth daily.   Dulaglutide (TRULICITY) 1.5 RJ/1.8AC SOPN Inject into the skin once a week.   fluocinonide ointment (LIDEX) 1.66 % Apply 1 application topically 2 (two) times daily as needed (IRRITATION).   fluticasone (FLONASE) 50 MCG/ACT nasal spray Place 1 spray into both nostrils daily as needed for allergies or rhinitis.   folic acid (FOLVITE) 1 MG tablet Take 1 tablet by mouth daily.   gabapentin (NEURONTIN) 100 MG capsule Take 200 mg by mouth at bedtime.   JARDIANCE 10 MG TABS tablet Take 10 mg by mouth daily.   Lancets (ONETOUCH ULTRASOFT) lancets 1 each by Other route daily.   lipase/protease/amylase (CREON) 36000 UNITS CPEP capsule Take 5 capsules with each meal take 2 capsules with snacks   Melatonin 3 MG CAPS Take by mouth 3 (three) times daily.   memantine (NAMENDA) 10 MG tablet Take 10 mg by mouth 2 (two) times daily.   metFORMIN (GLUCOPHAGE-XR)  500 MG 24 hr tablet Take 1,000 mg by mouth 2 (two) times daily.   MILK THISTLE PO Take 1 capsule by mouth daily as needed.   Multiple Vitamin (MULTI VITAMIN DAILY) TABS Take 1 tablet by mouth every morning.   Multiple Vitamins-Minerals (ICAPS AREDS 2 PO) Take 2 capsules by mouth in the morning and at bedtime.   mupirocin ointment (BACTROBAN) 2 % Apply topically 2 (two) times daily as needed. to affected area   olmesartan (BENICAR) 40 MG tablet Take 40 mg by mouth daily.   Omega-3 Fatty Acids (FISH OIL) 1200 MG CAPS Take 1 capsule by mouth daily.    omeprazole (PRILOSEC) 20 MG capsule Take 1 capsule by mouth daily.   sertraline (ZOLOFT) 100 MG tablet Take 200 mg by mouth daily.    tamsulosin (FLOMAX) 0.4 MG CAPS capsule Take 0.8 mg by mouth daily.   testosterone cypionate (DEPOTESTOTERONE CYPIONATE) 200 MG/ML injection Inject 200 mg into the muscle every 14 (fourteen) days.    Thiamine HCl (B-1) 100 MG TABS Take 2 tablets by mouth daily.   traZODone (DESYREL) 25 mg TABS tablet Take 25 mg by mouth at bedtime.   TURMERIC PO Take 1,000 mg by mouth as needed.   zolpidem (AMBIEN) 10 MG tablet Take 10 mg by mouth daily as needed. For sleep   [DISCONTINUED] diclofenac (VOLTAREN) 75 MG EC tablet  Take 75 mg by mouth 2 (two) times daily as needed (for pain.).   [DISCONTINUED] haloperidol (HALDOL) 1 MG tablet Take 1 tablet (1 mg total) by mouth at bedtime.   No facility-administered encounter medications on file as of 02/23/2022.     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    stenosis and arthritis   Depression    Diabetes ([place])    Diverticulosis    ED (erectile dysfunction)    Exocrine pancreatic insufficiency    Fatty liver    GERD (gastroesophageal reflux disease)    HOH (hard of hearing)    Hyperlipidemia     Hypertension    Hypothyroidism    Internal hemorrhoids    Leg edema, left    Macular degeneration    Neuromuscular disorder (HCC)    Osteoarthritis    Peripheral neuropathy    Sleep apnea    cpap   Testosterone deficiency    Tremors of nervous system    Umbilical hernia     Past Surgical History:  Procedure Laterality Date   BACK SURGERY  2009   BACK SURGERY  09/2014   Dr Marykay Lex   CATARACT EXTRACTION, BILATERAL  12/2016   COLONOSCOPY     ESOPHAGOGASTRODUODENOSCOPY (EGD) WITH PROPOFOL N/A 09/16/2017   Procedure: ESOPHAGOGASTRODUODENOSCOPY (EGD) WITH PROPOFOL;  Surgeon: Yetta Flock, MD;  Location: [facility];  Service: Gastroenterology;  Laterality: N/A;   LUMBAR LAMINECTOMY/ DECOMPRESSION WITH MET-RX  2014   TONSILLECTOMY  3220   UMBILICAL HERNIA REPAIR      Family History  Problem Relation Age of Onset   Atrial fibrillation Brother    Cancer Mother 67       abdominal cancer, unk name of cancer type   Other Father        viral encephalysis    Heart attack Maternal Grandfather    Heart attack Paternal Grandfather    Colon cancer Neg Hx    Stomach cancer Neg Hx    Colon polyps Neg Hx    Esophageal cancer Neg Hx    Rectal cancer Neg Hx     Social History   Social History Narrative   Has a fiance- Darrelyn Hillock     Review of Systems General: Denies fevers, chills, weight loss CV: Denies chest pain, shortness of breath, palpitations   Physical Exam    02/23/2022    3:17 PM 01/17/2022    3:49 PM 09/15/2021    3:08 PM  Vitals with BMI  Height $Remov'5\' 10"'rODyhN$  5' 10.25" $RemoveB'5\' 10"'WPaIxijL$   Weight 225 lbs 234 lbs 250 lbs 4 oz  BMI 32.28 25.42 70.62  Systolic 376 283 151  Diastolic 81 68 74  Pulse 70 75 96    General:  No acute distress,  Alert and oriented, Non-Toxic, Normal speech and affect HEENT: Moderate fullness below both eyelids.  Below the lid cheek junction the patient has an area of puffiness that is greater on the right compared to the left  side.  Assessment/Plan The area that the patient wants improved below the lid cheek junction is difficult to address with blepharoplasty.  Given his previous problems with anesthesia I suggest that he would consider a halo laser.  Uncertain how long this would correct his area of concern but may  have some improvement and will get full face improvement.   Lennice Sites 02/24/2022, 10:57 PM

## 2022-02-28 ENCOUNTER — Encounter: Payer: Self-pay | Admitting: Gastroenterology

## 2022-02-28 ENCOUNTER — Ambulatory Visit (AMBULATORY_SURGERY_CENTER): Payer: Medicare Other | Admitting: Gastroenterology

## 2022-02-28 VITALS — BP 114/67 | HR 60 | Temp 97.0°F | Resp 14 | Ht 70.0 in | Wt 234.0 lb

## 2022-02-28 DIAGNOSIS — Z86010 Personal history of colonic polyps: Secondary | ICD-10-CM | POA: Diagnosis not present

## 2022-02-28 DIAGNOSIS — D123 Benign neoplasm of transverse colon: Secondary | ICD-10-CM

## 2022-02-28 DIAGNOSIS — D132 Benign neoplasm of duodenum: Secondary | ICD-10-CM | POA: Diagnosis not present

## 2022-02-28 DIAGNOSIS — K317 Polyp of stomach and duodenum: Secondary | ICD-10-CM

## 2022-02-28 DIAGNOSIS — K2270 Barrett's esophagus without dysplasia: Secondary | ICD-10-CM | POA: Diagnosis not present

## 2022-02-28 DIAGNOSIS — Z09 Encounter for follow-up examination after completed treatment for conditions other than malignant neoplasm: Secondary | ICD-10-CM | POA: Diagnosis not present

## 2022-02-28 DIAGNOSIS — K31819 Angiodysplasia of stomach and duodenum without bleeding: Secondary | ICD-10-CM | POA: Diagnosis not present

## 2022-02-28 DIAGNOSIS — D122 Benign neoplasm of ascending colon: Secondary | ICD-10-CM

## 2022-02-28 DIAGNOSIS — K3189 Other diseases of stomach and duodenum: Secondary | ICD-10-CM | POA: Diagnosis not present

## 2022-02-28 DIAGNOSIS — K319 Disease of stomach and duodenum, unspecified: Secondary | ICD-10-CM | POA: Diagnosis not present

## 2022-02-28 MED ORDER — SODIUM CHLORIDE 0.9 % IV SOLN: 500.0000 mL | Freq: Once | INTRAVENOUS | Status: DC

## 2022-02-28 NOTE — Op Note (Addendum)
[facility] Patient Name: Robert Castro Procedure Date: 02/28/2022 10:11 AM MRN: [id number] Endoscopist: Remo Lipps P. Havery Moros , MD Age: 71 Referring MD:  Date of Birth: 1950/09/23 Gender: Male Account #: [id number] Procedure:                Colonoscopy Indications:              High risk colon cancer surveillance: Personal                            history of colonic polyps - numerous adenomas                            removed (>25) lat 2 exams, last exam 2020 - history                            of pancreatic insufficiency on replacement enzymes                            with continued bowel symptoms Medicines:                Monitored Anesthesia Care Procedure:                Pre-Anesthesia Assessment:                           - Prior to the procedure, a History and Physical                            was performed, and patient medications and                            allergies were reviewed. The patient's tolerance of                            previous anesthesia was also reviewed. The risks                            and benefits of the procedure and the sedation                            options and risks were discussed with the patient.                            All questions were answered, and informed consent                            was obtained. Prior Anticoagulants: The patient has                            taken no previous anticoagulant or antiplatelet                            agents. ASA Grade Assessment: III - A patient with  severe systemic disease. After reviewing the risks                            and benefits, the patient was deemed in                            satisfactory condition to undergo the procedure.                           After obtaining informed consent, the colonoscope                            was passed under direct vision. Throughout the                            procedure, the patient's blood  pressure, pulse, and                            oxygen saturations were monitored continuously. The                            Colonoscope was introduced through the anus and                            advanced to the the terminal ileum, with                            identification of the appendiceal orifice and IC                            valve. The colonoscopy was performed without                            difficulty. The patient tolerated the procedure                            well. The quality of the bowel preparation was                            adequate. The terminal ileum, ileocecal valve,                            appendiceal orifice, and rectum were photographed. Scope In: 10:37:19 AM Scope Out: 11:09:20 AM Scope Withdrawal Time: 0 hours 26 minutes 40 seconds  Total Procedure Duration: 0 hours 32 minutes 1 second  Findings:                 The perianal and digital rectal examinations were                            normal.                           The terminal ileum appeared normal.  A single medium-sized angiodysplastic lesion was                            found in the cecum.                           A few small-mouthed diverticula were found in the                            sigmoid colon.                           A 3 mm polyp was found in the ascending colon. The                            polyp was sessile. The polyp was removed with a                            cold snare. Resection and retrieval were complete.                           Five sessile polyps were found in the transverse                            colon. The polyps were 3 to 4 mm in size. These                            polyps were removed with a cold snare. Resection                            and retrieval were complete.                           Internal hemorrhoids were found during retroflexion.                           There was residual stool / seeds throughout  he                            colon, worst in the cecum which clogged the camera                            with lavage. The colon was quite spastic and poor                            air retention in the left colon. The exam was                            otherwise without abnormality.                           Biopsies for histology were taken with a cold  forceps from the right colon, left colon and                            transverse colon for evaluation of microscopic                            colitis. Complications:            No immediate complications. Estimated blood loss:                            Minimal. Estimated Blood Loss:     Estimated blood loss was minimal. Impression:               - The examined portion of the ileum was normal.                           - A single colonic angiodysplastic lesion.                           - Diverticulosis in the sigmoid colon.                           - One 3 mm polyp in the ascending colon, removed                            with a cold snare. Resected and retrieved.                           - Five 3 to 4 mm polyps in the transverse colon,                            removed with a cold snare. Resected and retrieved.                           - Internal hemorrhoids.                           - Poor air retention in the left colon, residual                            stool that required lavage.                           - The examination was otherwise normal.                           - Biopsies were taken with a cold forceps from the                            right colon, left colon and transverse colon for                            evaluation of microscopic colitis. Recommendation:           - Patient has  a contact number available for                            emergencies. The signs and symptoms of potential                            delayed complications were discussed with the                             patient. Return to normal activities tomorrow.                            Written discharge instructions were provided to the                            patient.                           - Resume previous diet.                           - Continue present medications.                           - Await pathology results with further                            recommendations about bowel symptoms pending the                            results. Patient takes Trulicity, may consider                            holding a few doses to see if that helps. Remo Lipps P. Angelus Hoopes, MD 02/28/2022 11:17:05 AM This report has been signed electronically.

## 2022-02-28 NOTE — Op Note (Addendum)
[facility] Patient Name: Robert Castro Procedure Date: 02/28/2022 10:17 AM MRN: [id number] Endoscopist: Remo Lipps P. Havery Moros , MD Age: 71 Referring MD:  Date of Birth: Feb 05, 1951 Gender: Male Account #: [id number] Procedure:                Upper GI endoscopy Indications:              Follow-up of duodenal adenoma (s/p EMR 2019), short                            segment Barrett's esophagus, persistent loose                            stools despite treatment of pancreatic insufficiency Medicines:                Monitored Anesthesia Care Procedure:                Pre-Anesthesia Assessment:                           - Prior to the procedure, a History and Physical                            was performed, and patient medications and                            allergies were reviewed. The patient's tolerance of                            previous anesthesia was also reviewed. The risks                            and benefits of the procedure and the sedation                            options and risks were discussed with the patient.                            All questions were answered, and informed consent                            was obtained. Prior Anticoagulants: The patient has                            taken no previous anticoagulant or antiplatelet                            agents. ASA Grade Assessment: III - A patient with                            severe systemic disease. After reviewing the risks                            and benefits, the patient was deemed in  satisfactory condition to undergo the procedure.                           After obtaining informed consent, the endoscope was                            passed under direct vision. Throughout the                            procedure, the patient's blood pressure, pulse, and                            oxygen saturations were monitored continuously. The                             Endoscope was introduced through the mouth, and                            advanced to the second part of duodenum. The upper                            GI endoscopy was accomplished without difficulty.                            The patient tolerated the procedure well. Scope In: Scope Out: Findings:                 Esophagogastric landmarks were identified: the                            Z-line was found at 39 cm, the gastroesophageal                            junction was found at 40 cm and the upper extent of                            the gastric folds was found at 40 cm from the                            incisors.                           The Z-line was irregula with a very short 75m to                            141mtongue of salmon colored mucosa. Biopsies were                            taken with a cold forceps for histology.                           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                           The exam of the esophagus was otherwise normal.                           A single 3 to 4 mm sessile polyp was found in the                            gastric body. The polyp was removed with a cold                            biopsy forceps. Resection and retrieval were                            complete.                           Nodular / benign polypoid mucosa was found in the                            gastric antrum. Biopsies were taken with a cold                            forceps for histology.                           The exam of the stomach was otherwise normal.                           The examined duodenum was normal other than a small                            duodenal AVM. Biopsies for histology were taken                            with a cold forceps for evaluation of celiac                            disease. No polyps. Complications:            No immediate complications.  Estimated blood loss:                            Minimal. Estimated Blood Loss:     Estimated blood loss was minimal. Impression:               - Esophagogastric landmarks identified.                           - Z-line irregular. Biopsied.                           - Ectopic gastric mucosa in the upper third of the  esophagus.                           - Normal esophagus otherwise                           - A single gastric polyp. Resected and retrieved.                           - Nodular / benign polypoid mucosa in the gastric                            antrum. Biopsied.                           - Small duodenal AVM                           - Normal examined duodenum otherwise. Biopsied. Recommendation:           - Patient has a contact number available for                            emergencies. The signs and symptoms of potential                            delayed complications were discussed with the                            patient. Return to normal activities tomorrow.                            Written discharge instructions were provided to the                            patient.                           - Resume previous diet.                           - Continue present medications.                           - Await pathology results. Remo Lipps P. Sandeep Delagarza, MD 02/28/2022 11:23:12 AM This report has been signed electronically.

## 2022-02-28 NOTE — Progress Notes (Signed)
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 History of pancreatic exocrine insufficiency however pancreatic enzymes not helping and continues to have problems with his bowels. No family history of colon cancer known. Otherwise feels well without any cardiopulmonary symptoms.    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    stenosis and arthritis   Depression    Diabetes ([place])    Diverticulosis    ED (erectile dysfunction)    Exocrine pancreatic insufficiency    Fatty liver    GERD (gastroesophageal reflux disease)    HOH (hard of hearing)    Hyperlipidemia    Hypertension    Hypothyroidism    Internal hemorrhoids    Leg edema, left    Macular degeneration    Neuromuscular disorder (HCC)    Osteoarthritis    Peripheral neuropathy    Sleep apnea    cpap   Testosterone deficiency    Tremors of nervous system    Umbilical hernia     Past Surgical History:  Procedure Laterality Date   BACK SURGERY  2009   BACK SURGERY  09/2014   Dr Marykay Lex   CATARACT EXTRACTION, BILATERAL  12/2016   COLONOSCOPY     ESOPHAGOGASTRODUODENOSCOPY (EGD) WITH PROPOFOL N/A 09/16/2017   Procedure: ESOPHAGOGASTRODUODENOSCOPY (EGD) WITH PROPOFOL;  Surgeon: Yetta Flock, MD;  Location: [facility];  Service: Gastroenterology;  Laterality: N/A;   LUMBAR LAMINECTOMY/ DECOMPRESSION WITH MET-RX  2014   TONSILLECTOMY  6384   UMBILICAL HERNIA REPAIR      Prior to Admission medications   Medication Sig Start  Date End Date Taking? Authorizing Provider  Alpha Lipoic Acid 200 MG CAPS Take 200 mg by mouth every morning. 01/19/22  Yes Jefry Lesinski, Carlota Raspberry, MD  anastrozole (ARIMIDEX) 1 MG tablet Take 1 mg daily by mouth.   Yes [provider]  atorvastatin (LIPITOR) 20 MG tablet Take 20 mg by mouth daily. 09/11/21  Yes [provider]  Cholecalciferol 125 MCG (5000 UT) TABS Take by mouth daily. 10/17/21 10/17/22 Yes [provider]  Cyanocobalamin 5000 MCG CAPS Take 2 capsules by mouth in the morning and at bedtime.   Yes [provider]  donepezil (ARICEPT) 10 MG tablet Take 10 mg by mouth daily. 12/08/21  Yes [provider]  folic acid (FOLVITE) 1 MG tablet Take 1 tablet by mouth daily. 10/17/21  Yes [provider]  gabapentin (NEURONTIN) 100 MG capsule Take 200 mg by mouth at bedtime.   Yes [provider]  JARDIANCE 10 MG TABS tablet Take 10 mg by mouth daily. 07/30/19  Yes [provider]  Lancets (ONETOUCH ULTRASOFT) lancets 1 each by Other route daily. 08/07/16  Yes [provider]  lipase/protease/amylase (CREON) 36000 UNITS CPEP capsule Take 5 capsules with each meal take 2 capsules with snacks 01/17/22  Yes Drae Mitzel, Carlota Raspberry, MD  Melatonin 3 MG CAPS Take by mouth  3 (three) times daily.   Yes [provider]  memantine (NAMENDA) 10 MG tablet Take 10 mg by mouth 2 (two) times daily. 01/11/22  Yes [provider]  metFORMIN (GLUCOPHAGE-XR) 500 MG 24 hr tablet Take 1,000 mg by mouth 2 (two) times daily.   Yes [provider]  Multiple Vitamin (MULTI VITAMIN DAILY) TABS Take 1 tablet by mouth every morning. 01/19/22  Yes Cherysh Epperly, Carlota Raspberry, MD  Multiple Vitamins-Minerals (ICAPS AREDS 2 PO) Take 2 capsules by mouth in the morning and at bedtime.   Yes [provider]  olmesartan (BENICAR) 40 MG tablet Take 40 mg by mouth daily. 07/21/21  Yes [provider]  omeprazole (PRILOSEC) 20  MG capsule Take 1 capsule by mouth daily. 03/24/20  Yes [provider]  sertraline (ZOLOFT) 100 MG tablet Take 200 mg by mouth daily.    Yes [provider]  silodosin (RAPAFLO) 8 MG CAPS capsule Take 8 mg by mouth daily. 02/07/22  Yes [provider]  Thiamine HCl (B-1) 100 MG TABS Take 2 tablets by mouth daily.   Yes [provider]  traZODone (DESYREL) 25 mg TABS tablet Take 25 mg by mouth at bedtime.   Yes [provider]  chlorhexidine (PERIDEX) 0.12 % solution Use as directed 10 mLs in the mouth or throat 2 (two) times daily as needed.  06/24/14   [provider]  Dulaglutide (TRULICITY) 1.5 WU/9.8JX SOPN Inject into the skin once a week.    [provider]  fluocinonide ointment (LIDEX) 9.14 % Apply 1 application topically 2 (two) times daily as needed (IRRITATION).    [provider]  fluticasone (FLONASE) 50 MCG/ACT nasal spray Place 1 spray into both nostrils daily as needed for allergies or rhinitis.    [provider]  MILK THISTLE PO Take 1 capsule by mouth daily as needed.    [provider]  mupirocin ointment (BACTROBAN) 2 % Apply topically 2 (two) times daily as needed. to affected area 12/18/19   [provider]  Omega-3 Fatty Acids (FISH OIL) 1200 MG CAPS Take 1 capsule by mouth daily.     [provider]  tamsulosin (FLOMAX) 0.4 MG CAPS capsule Take 0.8 mg by mouth daily. Patient not taking: Reported on 02/28/2022 11/05/20   [provider]  testosterone cypionate (DEPOTESTOTERONE CYPIONATE) 200 MG/ML injection Inject 200 mg into the muscle every 14 (fourteen) days.  06/02/14   [provider]  traMADol (ULTRAM) 50 MG tablet     [provider]  TURMERIC PO Take 1,000 mg by mouth as needed.    [provider]  zolpidem (AMBIEN) 10 MG tablet Take 10 mg by mouth daily as needed. For sleep 03/25/13   [provider]    Current  Outpatient Medications  Medication Sig Dispense Refill   Alpha Lipoic Acid 200 MG CAPS Take 200 mg by mouth every morning.     anastrozole (ARIMIDEX) 1 MG tablet Take 1 mg daily by mouth.     atorvastatin (LIPITOR) 20 MG tablet Take 20 mg by mouth daily.     Cholecalciferol 125 MCG (5000 UT) TABS Take by mouth daily.     Cyanocobalamin 5000 MCG CAPS Take 2 capsules by mouth in the morning and at bedtime.     donepezil (ARICEPT) 10 MG tablet Take 10 mg by mouth daily.     folic acid (FOLVITE) 1 MG tablet Take 1 tablet by mouth daily.     gabapentin (  NEURONTIN) 100 MG capsule Take 200 mg by mouth at bedtime.     JARDIANCE 10 MG TABS tablet Take 10 mg by mouth daily.     Lancets (ONETOUCH ULTRASOFT) lancets 1 each by Other route daily.     lipase/protease/amylase (CREON) 36000 UNITS CPEP capsule Take 5 capsules with each meal take 2 capsules with snacks 570 capsule 5   Melatonin 3 MG CAPS Take by mouth 3 (three) times daily.     memantine (NAMENDA) 10 MG tablet Take 10 mg by mouth 2 (two) times daily.     metFORMIN (GLUCOPHAGE-XR) 500 MG 24 hr tablet Take 1,000 mg by mouth 2 (two) times daily.     Multiple Vitamin (MULTI VITAMIN DAILY) TABS Take 1 tablet by mouth every morning. 30 tablet    Multiple Vitamins-Minerals (ICAPS AREDS 2 PO) Take 2 capsules by mouth in the morning and at bedtime.     olmesartan (BENICAR) 40 MG tablet Take 40 mg by mouth daily.     omeprazole (PRILOSEC) 20 MG capsule Take 1 capsule by mouth daily.     sertraline (ZOLOFT) 100 MG tablet Take 200 mg by mouth daily.      silodosin (RAPAFLO) 8 MG CAPS capsule Take 8 mg by mouth daily.     Thiamine HCl (B-1) 100 MG TABS Take 2 tablets by mouth daily.     traZODone (DESYREL) 25 mg TABS tablet Take 25 mg by mouth at bedtime.     chlorhexidine (PERIDEX) 0.12 % solution Use as directed 10 mLs in the mouth or throat 2 (two) times daily as needed.   0   Dulaglutide (TRULICITY) 1.5 SW/1.0XN SOPN Inject into the skin once a week.      fluocinonide ointment (LIDEX) 2.35 % Apply 1 application topically 2 (two) times daily as needed (IRRITATION).     fluticasone (FLONASE) 50 MCG/ACT nasal spray Place 1 spray into both nostrils daily as needed for allergies or rhinitis.     MILK THISTLE PO Take 1 capsule by mouth daily as needed.     mupirocin ointment (BACTROBAN) 2 % Apply topically 2 (two) times daily as needed. to affected area     Omega-3 Fatty Acids (FISH OIL) 1200 MG CAPS Take 1 capsule by mouth daily.      tamsulosin (FLOMAX) 0.4 MG CAPS capsule Take 0.8 mg by mouth daily. (Patient not taking: Reported on 02/28/2022)     testosterone cypionate (DEPOTESTOTERONE CYPIONATE) 200 MG/ML injection Inject 200 mg into the muscle every 14 (fourteen) days.   1   traMADol (ULTRAM) 50 MG tablet      TURMERIC PO Take 1,000 mg by mouth as needed.     zolpidem (AMBIEN) 10 MG tablet Take 10 mg by mouth daily as needed. For sleep     Current Facility-Administered Medications  Medication Dose Route Frequency Provider Last Rate Last Admin   0.9 %  sodium chloride infusion  500 mL Intravenous Once Johnluke Haugen, Carlota Raspberry, MD        Allergies as of 02/28/2022   (No Known Allergies)    Family History  Problem Relation Age of Onset   Atrial fibrillation Brother    Cancer Mother 31       abdominal cancer, unk name of cancer type   Other Father        viral encephalysis    Heart attack Maternal Grandfather    Heart attack Paternal Grandfather    Colon cancer Neg Hx    Stomach cancer Neg  Hx    Colon polyps Neg Hx    Esophageal cancer Neg Hx    Rectal cancer Neg Hx     Social History   Socioeconomic History   Marital status: Single    Spouse name: Not on file   Number of children: 0   Years of education: 7   Highest education level: Not on file  Occupational History   Occupation: Art therapist and performer  Tobacco Use   Smoking status: Former    Types: Cigarettes    Quit date: 08/25/2002    Years since quitting: 19.5    Smokeless tobacco: Never   Tobacco comments:    in college only 40 years ago  Vaping Use   Vaping Use: Never used  Substance and Sexual Activity   Alcohol use: Yes    Alcohol/week: 14.0 - 28.0 standard drinks of alcohol    Types: 14 - 28 Standard drinks or equivalent per week    Comment: varies  daily drinker   Drug use: No   Sexual activity: Not Currently  Other Topics Concern   Not on file  Social History Narrative   Has a fiance- Darrelyn Hillock   Social Determinants of Health   Financial Resource Strain: Not on file  Food Insecurity: Not on file  Transportation Needs: Not on file  Physical Activity: Not on file  Stress: Not on file  Social Connections: Not on file  Intimate Partner Violence: Not on file    Review of Systems: All other review of systems negative except as mentioned in the HPI.  Physical Exam: Vital signs BP 132/85   Pulse 61   Temp (!) 97 F (36.1 C)   Ht _0  (1.778 m)   Wt 234 lb (106.1 kg)   SpO2 96%   BMI 33.58 kg/m   General:   Alert,  Well-developed, pleasant and cooperative in NAD Lungs:  Clear throughout to auscultation.   Heart:  Regular rate and rhythm Abdomen:  Soft, nontender and nondistended.   Neuro/Psych:  Alert and cooperative. Normal mood and affect. A and O x 3  Jolly Mango, MD [facility]

## 2022-02-28 NOTE — Patient Instructions (Signed)
Handouts given for polyps and diverticulosis.  YOU HAD AN ENDOSCOPIC PROCEDURE TODAY AT [facility]:   Refer to the procedure report that was given to you for any specific questions about what was found during the examination.  If the procedure report does not answer your questions, please call your gastroenterologist to clarify.  If you requested that your care partner not be given the details of your procedure findings, then the procedure report has been included in a sealed envelope for you to review at your convenience later.  YOU SHOULD EXPECT: Some feelings of bloating in the abdomen. Passage of more gas than usual.  Walking can help get rid of the air that was put into your GI tract during the procedure and reduce the bloating. If you had a lower endoscopy (such as a colonoscopy or flexible sigmoidoscopy) you may notice spotting of blood in your stool or on the toilet paper. If you underwent a bowel prep for your procedure, you may not have a normal bowel movement for a few days.  Please Note:  You might notice some irritation and congestion in your nose or some drainage.  This is from the oxygen used during your procedure.  There is no need for concern and it should clear up in a day or so.  SYMPTOMS TO REPORT IMMEDIATELY:  Following lower endoscopy (colonoscopy):  Excessive amounts of blood in the stool  Significant tenderness or worsening of abdominal pains  Swelling of the abdomen that is new, acute  Fever of 100F or higher  Following upper endoscopy (EGD)  Vomiting of blood or coffee ground material  New chest pain or pain under the shoulder blades  Painful or persistently difficult swallowing  New shortness of breath  Black, tarry-looking stools  For urgent or emergent issues, a gastroenterologist can be reached at any hour by calling [phone]. Do not use MyChart messaging for urgent concerns.    DIET:  We do recommend a small meal at first, but then  you may proceed to your regular diet.  Drink plenty of fluids but you should avoid alcoholic beverages for 24 hours.  ACTIVITY:  You should plan to take it easy for the rest of today and you should NOT DRIVE or use heavy machinery until tomorrow (because of the sedation medicines used during the test).    FOLLOW UP: Our staff will call the number listed on your records the next business day following your procedure.  We will call around 7:15- 8:00 am to check on you and address any questions or concerns that you may have regarding the information given to you following your procedure. If we do not reach you, we will leave a message.  If you develop any symptoms (ie: fever, flu-like symptoms, shortness of breath, cough etc.) before then, please call [phone].  If you test positive for Covid 19 in the 2 weeks post procedure, please call and report this information to [place].    If any biopsies were taken you will be contacted by phone, MyChart and/or by letter within the next 1-3 weeks.  Please call us at [phone] if you have not heard about the biopsies in 3 weeks.    SIGNATURES/CONFIDENTIALITY: You and/or your care partner have signed paperwork which will be entered into your electronic medical record.  These signatures attest to the fact that that the information above on your After Visit Summary has been reviewed and is understood.  Full responsibility of the confidentiality of  this discharge information lies with you and/or your care-partner.

## 2022-02-28 NOTE — Progress Notes (Signed)
Pt's states no medical or surgical changes since previsit or office visit. 

## 2022-03-01 ENCOUNTER — Telehealth: Payer: Self-pay

## 2022-03-01 NOTE — Telephone Encounter (Signed)
Called [phone] and left a message we tried to reach pt for a follow up call. maw

## 2022-03-07 ENCOUNTER — Ambulatory Visit (HOSPITAL_BASED_OUTPATIENT_CLINIC_OR_DEPARTMENT_OTHER): Payer: Medicare Other | Admitting: Physical Therapy

## 2022-03-12 ENCOUNTER — Telehealth: Payer: Self-pay

## 2022-03-12 ENCOUNTER — Encounter: Payer: Self-pay | Admitting: Gastroenterology

## 2022-03-12 NOTE — Telephone Encounter (Signed)
Started hear this.  Can you clarify with him that his biopsies for Barrett's were actually negative for Barrett's esophagus, but we may continue to survey this over time given his history of it.  I had asked him to stop the Trulicity as that can often altered bowel habits as well.  Not sure if he has done that yet, if not he needs to hold that.  Otherwise he can add Imodium 2 tabs in the morning and titrate up as needed to see if that will help reduce his loose stools in addition to taking his Creon.  I can chat with Anderson Malta about his case when she sees him on Wednesday otherwise.  Thanks

## 2022-03-12 NOTE — Telephone Encounter (Signed)
Returned call to patient. I left pt a detailed vm with Dr. Doyne Keel recommendations as outlined below. I told the patient to call back with any questions or concerns prior to his appt on 03/14/22 with Ellouise Newer, PA-C.

## 2022-03-12 NOTE — Telephone Encounter (Signed)
.  A user error has taken place: error

## 2022-03-12 NOTE — Telephone Encounter (Signed)
Pt called in today with what he states are worsening symptoms. Pt reports that he has altered bowel habits, nausea, weakness, and fatigue. Pt reports that his symptoms have been gradually worsening and it is affecting his quality of life. Pt states that he is "married to the bathroom". He has discomfort all of the time. Pt reports that he is not having any success with Creon and he has already failed Zenpep. Pt thinks that his symptoms are urgent and thinks that something else is going on other than EPI. Pt thinks that EPI is some part of it but not the entire cause. Pt has been scheduled for a follow up appt with Ellouise Newer, PA-C on Wednesday, 03/14/22 at 3 pm. Pt also wanted to clarify if he still has Barrett's esophagus or not because on the result note it stated "I took some biopsies for Barrett's esophagus of his lower esophagus and this is negative". Pt reports that he knows that he has Barrett's, he takes Prilosec daily, and is concerned about this notation. Please advise if there is anything that patient can do in the interim. Thanks

## 2022-03-13 ENCOUNTER — Ambulatory Visit (HOSPITAL_BASED_OUTPATIENT_CLINIC_OR_DEPARTMENT_OTHER): Payer: Medicare Other | Admitting: Physical Therapy

## 2022-03-14 ENCOUNTER — Encounter: Payer: Self-pay | Admitting: Physician Assistant

## 2022-03-14 ENCOUNTER — Ambulatory Visit (INDEPENDENT_AMBULATORY_CARE_PROVIDER_SITE_OTHER): Payer: Medicare Other | Admitting: Physician Assistant

## 2022-03-14 ENCOUNTER — Other Ambulatory Visit (INDEPENDENT_AMBULATORY_CARE_PROVIDER_SITE_OTHER): Payer: Medicare Other

## 2022-03-14 VITALS — BP 128/80 | HR 86 | Ht 70.0 in | Wt 224.0 lb

## 2022-03-14 DIAGNOSIS — R197 Diarrhea, unspecified: Secondary | ICD-10-CM

## 2022-03-14 DIAGNOSIS — K8689 Other specified diseases of pancreas: Secondary | ICD-10-CM

## 2022-03-14 LAB — TSH: TSH: 2.15 u[IU]/mL (ref 0.35–5.50)

## 2022-03-14 NOTE — Patient Instructions (Signed)
You have been given a testing kit to check for small intestine bacterial overgrowth (SIBO) which is completed by a company named [facility]. Make sure to return your test in the mail using the return mailing label given to you along with the kit. Your demographic and insurance information have already been sent to the company and they should be in contact with you over the next 1-2 weeks regarding this test. [facility] will collect an upfront charge of $99.74 for commercial insurance plans and $209.74 is you are paying cash. Make sure to discuss with [facility] PRIOR to having the test to see if they have gotten information from your insurance company as to how much your testing will cost out of pocket, if any. Please keep in mind that you will be getting a call from phone number [phone] or a similar number. If you do not hear from them within this time frame, please call our office at [phone] or call [facility] directly at [phone].    Your provider has requested that you go to the basement level for lab work before leaving today. Press "B" on the elevator. The lab is located at the first door on the left as you exit the elevator.   You will hear from [place] about your referral to [facility] for EPI  Follow up per recommendations after test are complete   If you are age 10 or older, your body mass index should be between 23-30. Your Body mass index is 32.14 kg/m. If this is out of the aforementioned range listed, please consider follow up with your Primary Care Provider.  If you are age 59 or younger, your body mass index should be between 19-25. Your Body mass index is 32.14 kg/m. If this is out of the aformentioned range listed, please consider follow up with your Primary Care Provider.   ________________________________________________________  The [facility] providers would like to encourage you to use [facility] to communicate with providers for non-urgent requests or  questions.  Due to long hold times on the telephone, sending your provider a message by [facility] may be a faster and more efficient way to get a response.  Please allow 48 business hours for a response.  Please remember that this is for non-urgent requests.  _______________________________________________________   I appreciate the  opportunity to care for you  Thank You   Robert Castro

## 2022-03-14 NOTE — Progress Notes (Signed)
Chief Complaint: Follow-up pancreatic insufficiency  HPI:    Robert Castro is a 71 year old male with a past medical history as listed below including diabetes, GERD and adenomatous duodenal polyp as well as Barrett's esophagus, known to Dr. Havery Moros, who presents to clinic today for follow-up of pancreatic insufficiency.    02/28/2022 EGD for follow-up of duodenal adenoma and short segment Barrett's and persistent loose stools despite treatment of pancreatic insufficiency with ectopic gastric mucosa in the upper third of the esophagus, single gastric polyp, nodular and benign polypoid mucosa in the gastric antrum, small duodenal AVM and otherwise normal.  EGD biopsies showed no evidence of recurrent duodenal adenoma.  It was recommended he have a repeat EGD in a few years for further surveillance.    02/28/2022 colonoscopy for personal history of adenomas with a single colonic angiodysplastic lesion, diverticulosis in the sigmoid colon, one 3 mm polyp in the ascending colon, five 3-4 mm polyps in transverse colon, internal hemorrhoids and otherwise normal.  At that time discussed that may do a trial of holding Trulicity to see if this helped a little bit.  Showed multiple adenomas and repeat was recommended in 6 years.    03/02/2022 patient was advised to stop his Trulicity for 1 to 2 months to see if his bowel habits improved.  Otherwise it is recommended he take a Imodium every morning and titrate up as needed.  Told to continue pancreatic enzyme replacement.    Today, patient tells me that he is frustrated with his ongoing symptoms over the past year and a half.  All that ever seems to been done is that his Creon was increased a lot.  Tells me this is expensive for him and really does not feel like it is working at all.  He can have anywhere from 1-3 bowel movements a day which are urgent and loose, he has not had a normal stool in "years".  Along with this has been experiencing some fecal leakage at night  while sleeping for which he has started to wear depends.  Also tells me he has steatorrhea at some points.  Tells me he has researched this a lot and wonders if there is something else going on as well as all of this.  Tells me he has stopped his Trulicity and has not taken it for a month but his symptoms have not changed.  He is taking 2 Imodium in the morning which does help to stop it completely but when he has another bowel movement it is still loose and not normal so it "does not solve the problem".  He brings with him a list of questions which we discussed in detail.    Denies fever, chills, weight loss or blood in his stool.  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    stenosis and arthritis   Depression    Diabetes ([place])    Diverticulosis    ED (erectile dysfunction)    Exocrine pancreatic insufficiency    Fatty liver    GERD (gastroesophageal reflux disease)    HOH (hard of hearing)    Hyperlipidemia    Hypertension    Hypothyroidism    Internal hemorrhoids    Leg edema, left    Macular degeneration    Neuromuscular  disorder ([place])    Osteoarthritis    Peripheral neuropathy    Sleep apnea    cpap   Testosterone deficiency    Tremors of nervous system    Umbilical hernia     Past Surgical History:  Procedure Laterality Date   BACK SURGERY  2009   BACK SURGERY  09/2014   Dr Marykay Lex   CATARACT EXTRACTION, BILATERAL  12/2016   COLONOSCOPY     ESOPHAGOGASTRODUODENOSCOPY (EGD) WITH PROPOFOL N/A 09/16/2017   Procedure: ESOPHAGOGASTRODUODENOSCOPY (EGD) WITH PROPOFOL;  Surgeon: Yetta Flock, MD;  Location: [facility];  Service: Gastroenterology;  Laterality: N/A;   LUMBAR LAMINECTOMY/ DECOMPRESSION WITH MET-RX  2014   TONSILLECTOMY  8099   UMBILICAL HERNIA REPAIR      Current Outpatient  Medications  Medication Sig Dispense Refill   Alpha Lipoic Acid 200 MG CAPS Take 200 mg by mouth every morning.     anastrozole (ARIMIDEX) 1 MG tablet Take 1 mg daily by mouth.     atorvastatin (LIPITOR) 20 MG tablet Take 20 mg by mouth daily.     chlorhexidine (PERIDEX) 0.12 % solution Use as directed 10 mLs in the mouth or throat 2 (two) times daily as needed.   0   Cholecalciferol 125 MCG (5000 UT) TABS Take by mouth daily.     Cyanocobalamin 5000 MCG CAPS Take 2 capsules by mouth in the morning and at bedtime.     donepezil (ARICEPT) 10 MG tablet Take 10 mg by mouth daily.     Dulaglutide (TRULICITY) 1.5 IP/3.8SN SOPN Inject into the skin once a week.     fluocinonide ointment (LIDEX) 0.53 % Apply 1 application topically 2 (two) times daily as needed (IRRITATION).     fluticasone (FLONASE) 50 MCG/ACT nasal spray Place 1 spray into both nostrils daily as needed for allergies or rhinitis.     folic acid (FOLVITE) 1 MG tablet Take 1 tablet by mouth daily.     gabapentin (NEURONTIN) 100 MG capsule Take 200 mg by mouth at bedtime.     JARDIANCE 10 MG TABS tablet Take 10 mg by mouth daily.     Lancets (ONETOUCH ULTRASOFT) lancets 1 each by Other route daily.     lipase/protease/amylase (CREON) 36000 UNITS CPEP capsule Take 5 capsules with each meal take 2 capsules with snacks 570 capsule 5   Melatonin 3 MG CAPS Take by mouth 3 (three) times daily.     memantine (NAMENDA) 10 MG tablet Take 10 mg by mouth 2 (two) times daily.     metFORMIN (GLUCOPHAGE-XR) 500 MG 24 hr tablet Take 1,000 mg by mouth 2 (two) times daily.     MILK THISTLE PO Take 1 capsule by mouth daily as needed.     Multiple Vitamin (MULTI VITAMIN DAILY) TABS Take 1 tablet by mouth every morning. 30 tablet    Multiple Vitamins-Minerals (ICAPS AREDS 2 PO) Take 2 capsules by mouth in the morning and at bedtime.     mupirocin ointment (BACTROBAN) 2 % Apply topically 2 (two) times daily as needed. to affected area     olmesartan  (BENICAR) 40 MG tablet Take 40 mg by mouth daily.     Omega-3 Fatty Acids (FISH OIL) 1200 MG CAPS Take 1 capsule by mouth daily.      omeprazole (PRILOSEC) 20 MG capsule Take 1 capsule by mouth daily.     sertraline (ZOLOFT) 100 MG tablet Take 200 mg by mouth daily.      silodosin (RAPAFLO) 8  MG CAPS capsule Take 8 mg by mouth daily.     tamsulosin (FLOMAX) 0.4 MG CAPS capsule Take 0.8 mg by mouth daily. (Patient not taking: Reported on 02/28/2022)     testosterone cypionate (DEPOTESTOTERONE CYPIONATE) 200 MG/ML injection Inject 200 mg into the muscle every 14 (fourteen) days.   1   Thiamine HCl (B-1) 100 MG TABS Take 2 tablets by mouth daily.     traMADol (ULTRAM) 50 MG tablet      traZODone (DESYREL) 25 mg TABS tablet Take 25 mg by mouth at bedtime.     TURMERIC PO Take 1,000 mg by mouth as needed.     zolpidem (AMBIEN) 10 MG tablet Take 10 mg by mouth daily as needed. For sleep     No current facility-administered medications for this visit.    Allergies as of 03/14/2022   (No Known Allergies)    Family History  Problem Relation Age of Onset   Atrial fibrillation Brother    Cancer Mother 49       abdominal cancer, unk name of cancer type   Other Father        viral encephalysis    Heart attack Maternal Grandfather    Heart attack Paternal Grandfather    Colon cancer Neg Hx    Stomach cancer Neg Hx    Colon polyps Neg Hx    Esophageal cancer Neg Hx    Rectal cancer Neg Hx     Social History   Socioeconomic History   Marital status: Single    Spouse name: Not on file   Number of children: 0   Years of education: 16   Highest education level: Not on file  Occupational History   Occupation: Art therapist and performer  Tobacco Use   Smoking status: Former    Types: Cigarettes    Quit date: 08/25/2002    Years since quitting: 19.5   Smokeless tobacco: Never   Tobacco comments:    in college only 40 years ago  Vaping Use   Vaping Use: Never used  Substance and  Sexual Activity   Alcohol use: Yes    Alcohol/week: 14.0 - 28.0 standard drinks of alcohol    Types: 14 - 28 Standard drinks or equivalent per week    Comment: varies  daily drinker   Drug use: No   Sexual activity: Not Currently  Other Topics Concern   Not on file  Social History Narrative   Has a fiance- Darrelyn Hillock   Social Determinants of Radio broadcast assistant Strain: Not on file  Food Insecurity: Not on file  Transportation Needs: Not on file  Physical Activity: Not on file  Stress: Not on file  Social Connections: Not on file  Intimate Partner Violence: Not on file    Review of Systems:    Constitutional: No weight loss, fever or chills Cardiovascular: No chest pain  Respiratory: No SOB  Gastrointestinal: See HPI and otherwise negative   Physical Exam:  Vital signs: BP 128/80   Pulse 86   Ht 5' 10"  (1.778 m)   Wt 224 lb (101.6 kg)   SpO2 98%   BMI 32.14 kg/m    Constitutional:   Pleasant Elderly Caucasian male appears to be in NAD, Well developed, Well nourished, alert and cooperative Respiratory: Respirations even and unlabored. Lungs clear to auscultation bilaterally.   No wheezes, crackles, or rhonchi.  Cardiovascular: Normal S1, S2. No MRG. Regular rate and rhythm. No peripheral edema, cyanosis or  pallor.  Gastrointestinal:  Soft, nondistended, nontender. No rebound or guarding. Normal bowel sounds. No appreciable masses or hepatomegaly. Rectal:  Not performed.  Psychiatric: Oriented to person, place and time. Demonstrates good judgement and reason without abnormal affect or behaviors.  RELEVANT LABS AND IMAGING: CBC    Component Value Date/Time   WBC 7.5 05/29/2021 0132   RBC 4.58 05/29/2021 0132   HGB 15.7 05/29/2021 0132   HCT 47.0 05/29/2021 0132   PLT 125 (L) 05/29/2021 0132   MCV 102.6 (H) 05/29/2021 0132   MCH 34.3 (H) 05/29/2021 0132   MCHC 33.4 05/29/2021 0132   RDW 13.4 05/29/2021 0132   LYMPHSABS 1.4 05/29/2021 0132   MONOABS 1.2  (H) 05/29/2021 0132   EOSABS 0.0 05/29/2021 0132   BASOSABS 0.0 05/29/2021 0132    CMP     Component Value Date/Time   NA 142 05/29/2021 0132   K 3.8 05/29/2021 0132   CL 104 05/29/2021 0132   CO2 23 05/29/2021 0132   GLUCOSE 139 (H) 05/29/2021 0132   BUN 21 05/29/2021 0132   CREATININE 0.98 05/29/2021 0132   CREATININE 1.25 (H) 02/22/2020 1426   CALCIUM 9.4 05/29/2021 0132   PROT 7.1 05/29/2021 0132   ALBUMIN 4.4 05/29/2021 0132   AST 26 05/29/2021 0132   AST 73 (H) 02/22/2020 1426   ALT 41 05/29/2021 0132   ALT 112 (H) 02/22/2020 1426   ALKPHOS 55 05/29/2021 0132   BILITOT 1.7 (H) 05/29/2021 0132   BILITOT 0.8 02/22/2020 1426   GFRNONAA >60 05/29/2021 0132   GFRNONAA 58 (L) 02/22/2020 1426   GFRAA >60 02/22/2020 1426    Assessment: 1.  Pancreatic insufficiency: Discovered via fecal pancreatic elastase about a year and a half ago, patient has seen no benefit from Creon 5 tabs with a meal and 2 with a snack 2.  Diarrhea: Presumed from above but not controlled with Creon supplementation, recent EGD and colonoscopy were unrevealing for source, previous stool studies only included a GI path panel and fecal pancreatic elastase and were done over a year and a half ago, does express some bloating as well; consider infectious cause on top of pancreatic insufficiency +/- SIBO +/- celiac disease +/- hyperthyroid or other cause  Plan: 1.  We will repeat and order some new testing today.  Also had long discussion about referral to a tertiary center to a specialist for EPI.  Will discuss with Dr. Havery Moros who he recommends. 2.  Ordered repeat celiac labs, SIBO testing, TSH and stool studies including a GI pathogen panel, O&P, fecal lactoferrin and calprotectin 3.  Also thoroughly reviewed patient's recent EGD and colonoscopy.  Discussed that biopsies for Barrett's were negative.  We still need to observe him closely with repeat EGD in 2 to 3 years.  He should also continue his Omeprazole  20 mg daily. 4.  Patient to follow in clinic per recommendations after testing above.  Again he requested referral to a tertiary center.  Will discuss further with Dr. Havery Moros. 5.  Recommend continuing to hold Trulicity for another month, if no change then he can restart 6.  Continue Imodium if he is needing to leave the house for some reason  Ellouise Newer, PA-C [facility] 03/14/2022, 3:05 PM  Cc: Aletha Halim., PA-C

## 2022-03-15 ENCOUNTER — Other Ambulatory Visit: Payer: Medicare Other

## 2022-03-15 ENCOUNTER — Encounter (HOSPITAL_BASED_OUTPATIENT_CLINIC_OR_DEPARTMENT_OTHER): Payer: Medicare Other | Admitting: Physical Therapy

## 2022-03-15 DIAGNOSIS — R197 Diarrhea, unspecified: Secondary | ICD-10-CM

## 2022-03-15 DIAGNOSIS — K8689 Other specified diseases of pancreas: Secondary | ICD-10-CM

## 2022-03-15 LAB — IGA: Immunoglobulin A: 92 mg/dL (ref 70–320)

## 2022-03-15 LAB — TISSUE TRANSGLUTAMINASE, IGA: (tTG) Ab, IgA: <1 U/mL

## 2022-03-15 NOTE — Progress Notes (Signed)
Sorry to hear he is not doing well.  He is on appropriately dosed pancreatic enzymes.  He may be able to take 6 capsules/meal, I think he is at 5 right now, if he wants to try that.  I had asked him to do Imodium on top of this, sounds like its not working. May be reasonable to add Colestid 1 g twice daily or cholestyramine twice daily to his regimen to see if that will help. If he really desires a tertiary care opinion, I will honor that for him.  I am not aware of a pancreatic insufficiency specialist specifically, we can refer him to [facility] and he would probably see an advanced endoscopist.  You can refer him there if you would prefer.  Otherwise if he wants to give a trial of a bit higher dosing of his pancreatic enzymes and adding Colestid or cholestyramine he can try that first.  Up to him.  I am happy to see him back in the office for reassessment as well.  Thanks

## 2022-03-16 LAB — FECAL LACTOFERRIN, QUANT
Fecal Lactoferrin: POSITIVE — AB
MICRO NUMBER:: 13673216
SPECIMEN QUALITY:: ADEQUATE

## 2022-03-16 NOTE — Progress Notes (Signed)
Left message on machine to call back  

## 2022-03-17 LAB — CALPROTECTIN, FECAL: Calprotectin, Fecal: 79 ug/g (ref 0–120)

## 2022-03-19 ENCOUNTER — Encounter (HOSPITAL_BASED_OUTPATIENT_CLINIC_OR_DEPARTMENT_OTHER): Payer: Medicare Other | Admitting: Physical Therapy

## 2022-03-20 ENCOUNTER — Encounter (HOSPITAL_BASED_OUTPATIENT_CLINIC_OR_DEPARTMENT_OTHER): Payer: Medicare Other | Admitting: Physical Therapy

## 2022-03-20 ENCOUNTER — Telehealth: Payer: Self-pay

## 2022-03-20 DIAGNOSIS — R197 Diarrhea, unspecified: Secondary | ICD-10-CM

## 2022-03-20 DIAGNOSIS — K8689 Other specified diseases of pancreas: Secondary | ICD-10-CM

## 2022-03-20 MED ORDER — PANCRELIPASE (LIP-PROT-AMYL) 36000-114000 UNITS PO CPEP
ORAL_CAPSULE | ORAL | 5 refills | Status: DC
Start: 2022-03-20 — End: 2022-06-22

## 2022-03-20 MED ORDER — CHOLESTYRAMINE 4 G PO PACK: 4.0000 g | PACK | Freq: Two times a day (BID) | ORAL | 2 refills | Status: DC

## 2022-03-20 NOTE — Telephone Encounter (Signed)
I called patient and left him a detailed vm with Dr. Doyne Keel recommendations regarding medication adjustments as outlined below. I also informed pt that Dr. Havery Moros does not know any EPI specialist but would be happy to refer him to [facility] or another tertiary care center if her prefers. I advised pt to give me a call back to discuss how he would like to proceed. I also told pt that he could send a MyChart message with is response as well.

## 2022-03-20 NOTE — Telephone Encounter (Signed)
Robert Erp, PA  [place], [place], RN Please let the patient know that one of his stool studies which is a marker for possible inflammation in the colon was elevated.  Discussed with Dr. Havery Moros.  He did have a recent colonoscopy which did not show inflammation in the colon, but there is a chance that there could be inflammation in his small bowel.  Per Dr. Havery Moros he recommends a CT enterography for further evaluation to make sure we are not missing anything in the small bowel.  Of course, the patient has already requested a referral to [facility] and they could do this work-up for him as well, but just wanted him to give the option if he would like 1.   Thanks, JLL

## 2022-03-20 NOTE — Addendum Note (Signed)
Addended by: Yevette Edwards on: 03/20/2022 04:31 PM   Modules accepted: Orders

## 2022-03-20 NOTE — Telephone Encounter (Signed)
-----   Message from Levin Erp, [place] sent at 03/16/2022  8:50 AM EDT ----- Regarding: Please call today 7/21 Please call today, saw patient in clinic recently.  So far labs are negative.  Dr. Havery Moros suggested increasing his Creon to 1 more tab a day with meals and adding Cholestyramine twice daily.  We can do this while patient is awaiting referral to [facility].  Please let him know that Dr. Havery Moros is unaware of any specialists in EPI in our area, but is happy to refer him to [facility] for further evaluation.  Please make sure this is what the patient wants.  Thanks, JL L ----- Message ----- From: Yetta Flock, MD Sent: 03/15/2022   7:15 PM EDT To: Levin Erp, PA     ----- Message ----- From: Levin Erp, [place] Sent: 03/14/2022   4:00 PM EDT To: Yetta Flock, MD

## 2022-03-20 NOTE — Telephone Encounter (Addendum)
Patient returned call. He received my detailed vm with Dr. Doyne Keel recommendations regarding adjusting medications. Pt would like to proceed with trial of Cholestyramine BID and he also requested a new prescription with the increased dose of Creon be sent to [facility] on [address]. Pt would also like to proceed with CT enterography. Pt is aware that we will place the order and radiology scheduling will contact him within the next week to set up his appt. Pt has requested that we proceed with referral to [facility]. Pt has again been advised that Dr. Havery Moros is not familiar with any EPI specialist and that he may be scheduled with an advanced endoscopist at [facility]. Pt would like to proceed with referral and he is also going to do some research to see if he can find a specific EPI specialist. Pt verbalized understanding of all information and had no concerns at the end of the call.  RX for Cholestyramine and Creon sent to requested pharmacy. CT enterography order in epic. Secure staff message sent to radiology scheduling to set up appt.  Referral, records, demographic, and insurance information faxed to [facility] at [phone].  Phone # [phone]

## 2022-03-21 LAB — OVA AND PARASITE EXAMINATION
CONCENTRATE RESULT:: NONE SEEN
MICRO NUMBER:: 13672864
SPECIMEN QUALITY:: ADEQUATE
TRICHROME RESULT:: NONE SEEN

## 2022-03-22 ENCOUNTER — Encounter (HOSPITAL_BASED_OUTPATIENT_CLINIC_OR_DEPARTMENT_OTHER): Payer: Medicare Other | Admitting: Physical Therapy

## 2022-03-27 ENCOUNTER — Encounter (HOSPITAL_BASED_OUTPATIENT_CLINIC_OR_DEPARTMENT_OTHER): Payer: Medicare Other | Admitting: Physical Therapy

## 2022-03-29 ENCOUNTER — Encounter (HOSPITAL_BASED_OUTPATIENT_CLINIC_OR_DEPARTMENT_OTHER): Payer: Medicare Other | Admitting: Physical Therapy

## 2022-04-02 ENCOUNTER — Ambulatory Visit (HOSPITAL_COMMUNITY)
Admission: RE | Admit: 2022-04-02 | Discharge: 2022-04-02 | Disposition: A | Discharge status: Home / Self Care | Payer: Medicare Other | Source: Ambulatory Visit | Attending: Physician Assistant | Admitting: Physician Assistant

## 2022-04-02 DIAGNOSIS — K8689 Other specified diseases of pancreas: Secondary | ICD-10-CM | POA: Insufficient documentation

## 2022-04-02 DIAGNOSIS — R197 Diarrhea, unspecified: Secondary | ICD-10-CM | POA: Diagnosis present

## 2022-04-02 MED ORDER — IOHEXOL 300 MG/ML  SOLN
100.0000 mL | Freq: Once | INTRAMUSCULAR | Status: AC | PRN
  Administered 2022-04-02: 100 mL via INTRAVENOUS

## 2022-04-02 MED ORDER — BARIUM SULFATE 0.1 % PO SUSP
ORAL | Status: AC
  Filled 2022-04-02: qty 1

## 2022-04-02 MED ORDER — SODIUM CHLORIDE (PF) 0.9 % IJ SOLN
INTRAMUSCULAR | Status: AC
  Filled 2022-04-02: qty 50

## 2022-04-03 ENCOUNTER — Telehealth: Payer: Self-pay | Admitting: Physician Assistant

## 2022-04-03 NOTE — Telephone Encounter (Signed)
Refer to CT result note.

## 2022-04-03 NOTE — Telephone Encounter (Signed)
Patient called to go over CT results requested a call back if possible today.

## 2022-06-22 ENCOUNTER — Other Ambulatory Visit: Payer: Self-pay

## 2022-06-22 MED ORDER — PANCRELIPASE (LIP-PROT-AMYL) 36000-114000 UNITS PO CPEP
ORAL_CAPSULE | ORAL | 1 refills | Status: DC
Start: 2022-06-22 — End: 2022-10-10

## 2022-06-22 NOTE — Telephone Encounter (Signed)
Creon refilled , okay per Ellouise Newer PA-C.

## 2022-08-31 ENCOUNTER — Encounter: Payer: Self-pay | Admitting: Gastroenterology

## 2022-08-31 ENCOUNTER — Ambulatory Visit (INDEPENDENT_AMBULATORY_CARE_PROVIDER_SITE_OTHER): Payer: Medicare Other | Admitting: Gastroenterology

## 2022-08-31 VITALS — BP 130/78 | HR 65 | Ht 70.0 in | Wt 226.5 lb

## 2022-08-31 DIAGNOSIS — K529 Noninfective gastroenteritis and colitis, unspecified: Secondary | ICD-10-CM | POA: Diagnosis not present

## 2022-08-31 DIAGNOSIS — K8681 Exocrine pancreatic insufficiency: Secondary | ICD-10-CM | POA: Insufficient documentation

## 2022-08-31 MED ORDER — DIPHENOXYLATE-ATROPINE 2.5-0.025 MG PO TABS: 1.0000 | ORAL_TABLET | Freq: Three times a day (TID) | ORAL | 1 refills | Status: DC | PRN

## 2022-08-31 NOTE — Progress Notes (Signed)
08/31/2022 Robert Castro [id number] Oct 06, 1950   HISTORY OF PRESENT ILLNESS: This is a 72 year old male who is a patient of Dr. Doyne Keel.  He has a past medical history including diabetes, GERD, adenomatous duodenal polyp as well as history of Barrett's esophagus, and EPI.  Also has chronic diarrhea thought to be secondary to the EPI.  He has been on Creon, now 6 pills with meals and 2 with snacks, but that has not made much difference in his diarrhea symptoms.  He has had evaluation with EGDs and colonoscopies.  He was actually referred to [facility] and saw Dr. Tillie Rung and underwent EUS there in November, which was really unremarkable.  He has tried Questran in the past.  Currently uses Imodium as needed, which does work to some degree, but he is wanting to know if there is something that could work better.  After questioning, he does consume a large amount of diet sodas on a daily basis.  He has been very distressed by the diarrhea over the past couple of years as has been expressed previous visits as well.  SIBO testing ordered previously but he has not yet performed that.   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    stenosis and arthritis   Depression    Diabetes ([place])    Diverticulosis    ED (erectile dysfunction)    Exocrine pancreatic insufficiency    Fatty liver    GERD (gastroesophageal reflux disease)    HOH (hard of hearing)    Hyperlipidemia    Hypertension    Hypothyroidism    Internal hemorrhoids    Leg edema, left    Macular degeneration    Neuromuscular disorder (HCC)    Osteoarthritis    Peripheral neuropathy    Sleep apnea    cpap   Testosterone deficiency    Tremors of nervous system    Umbilical hernia    Past Surgical History:  Procedure Laterality Date   BACK SURGERY   2009   BACK SURGERY  09/2014   Dr Marykay Lex   CATARACT EXTRACTION, BILATERAL  12/2016   COLONOSCOPY     ESOPHAGOGASTRODUODENOSCOPY (EGD) WITH PROPOFOL N/A 09/16/2017   Procedure: ESOPHAGOGASTRODUODENOSCOPY (EGD) WITH PROPOFOL;  Surgeon: Yetta Flock, MD;  Location: [facility];  Service: Gastroenterology;  Laterality: N/A;   LUMBAR LAMINECTOMY/ DECOMPRESSION WITH MET-RX  2014   TONSILLECTOMY  0539   UMBILICAL HERNIA REPAIR      reports that he quit smoking about 20 years ago. His smoking use included cigarettes. He has never used smokeless tobacco. He reports current alcohol use of about 14.0 - 28.0 standard drinks of alcohol per week. He reports that he does not use drugs. family history includes Atrial fibrillation in his brother; Cancer (age of onset: 87) in his mother; Heart attack in his maternal grandfather and paternal grandfather; Other in his father. No Known Allergies    Outpatient Encounter Medications as of 08/31/2022  Medication Sig   Alpha Lipoic Acid 200 MG CAPS Take 200 mg by mouth every morning.   anastrozole (ARIMIDEX) 1 MG tablet Take 1 mg daily by mouth.   atorvastatin (LIPITOR) 20 MG tablet Take 20 mg by mouth daily.   chlorhexidine (PERIDEX) 0.12 % solution Use as directed  10 mLs in the mouth or throat 2 (two) times daily as needed.    Cholecalciferol 125 MCG (5000 UT) TABS Take by mouth daily.   cholestyramine (QUESTRAN) 4 g packet Take 1 packet (4 g total) by mouth 2 (two) times daily.   Cyanocobalamin 5000 MCG CAPS Take 2 capsules by mouth in the morning and at bedtime.   donepezil (ARICEPT) 10 MG tablet Take 10 mg by mouth daily.   Dulaglutide (TRULICITY) 1.5 QQ/5.9DG SOPN Inject into the skin once a week.   fluocinonide ointment (LIDEX) 3.87 % Apply 1 application topically 2 (two) times daily as needed (IRRITATION).   fluticasone (FLONASE) 50 MCG/ACT nasal spray Place 1 spray into both nostrils daily as needed for allergies or rhinitis.   folic acid (FOLVITE)  1 MG tablet Take 1 tablet by mouth daily.   gabapentin (NEURONTIN) 100 MG capsule Take 200 mg by mouth at bedtime.   JARDIANCE 10 MG TABS tablet Take 10 mg by mouth daily.   Lancets (ONETOUCH ULTRASOFT) lancets 1 each by Other route daily.   lipase/protease/amylase (CREON) 36000 UNITS CPEP capsule Take 6 capsules with each meal take 2 capsules with snacks   Melatonin 3 MG CAPS Take by mouth 3 (three) times daily.   memantine (NAMENDA) 10 MG tablet Take 10 mg by mouth 2 (two) times daily.   metFORMIN (GLUCOPHAGE-XR) 500 MG 24 hr tablet Take 1,000 mg by mouth 2 (two) times daily.   MILK THISTLE PO Take 1 capsule by mouth daily as needed.   Multiple Vitamin (MULTI VITAMIN DAILY) TABS Take 1 tablet by mouth every morning.   Multiple Vitamins-Minerals (ICAPS AREDS 2 PO) Take 2 capsules by mouth in the morning and at bedtime.   mupirocin ointment (BACTROBAN) 2 % Apply topically 2 (two) times daily as needed. to affected area   olmesartan (BENICAR) 40 MG tablet Take 40 mg by mouth daily.   Omega-3 Fatty Acids (FISH OIL) 1200 MG CAPS Take 1 capsule by mouth daily.    omeprazole (PRILOSEC) 20 MG capsule Take 1 capsule by mouth daily.   sertraline (ZOLOFT) 100 MG tablet Take 200 mg by mouth daily.    silodosin (RAPAFLO) 8 MG CAPS capsule Take 8 mg by mouth daily.   tamsulosin (FLOMAX) 0.4 MG CAPS capsule Take 0.8 mg by mouth daily.   testosterone cypionate (DEPOTESTOTERONE CYPIONATE) 200 MG/ML injection Inject 200 mg into the muscle every 14 (fourteen) days.    Thiamine HCl (B-1) 100 MG TABS Take 2 tablets by mouth daily.   traMADol (ULTRAM) 50 MG tablet    traZODone (DESYREL) 25 mg TABS tablet Take 25 mg by mouth at bedtime.   TURMERIC PO Take 1,000 mg by mouth as needed.   zolpidem (AMBIEN) 10 MG tablet Take 10 mg by mouth daily as needed. For sleep   No facility-administered encounter medications on file as of 08/31/2022.     REVIEW OF SYSTEMS  : All other systems reviewed and negative except  where noted in the History of Present Illness.   PHYSICAL EXAM: Ht '5\' 10"'$  (1.778 m)   Wt 226 lb 8 oz (102.7 kg)   BMI 32.50 kg/m  General: Well developed white male in no acute distress Head: Normocephalic and atraumatic Eyes:  Sclerae anicteric, conjunctiva pink. Ears: Normal auditory acuity Lungs: Clear throughout to auscultation; no W/R/R. Heart: Regular rate and rhythm; no M/R/G. Musculoskeletal: Symmetrical with no gross deformities  Skin: No lesions on visible extremities Extremities: No edema  Neurological: Alert oriented  x 4, grossly non-focal Psychological:  Alert and cooperative. Normal mood and affect  ASSESSMENT AND PLAN: *Exocrine pancreatic insufficiency with chronic diarrhea: Fecal elastase stool study was markedly abnormal.  He is on Creon 6 tablets with meals and 2 with snacks.  He needs to continue this, but this has not allowed improvement in his diarrhea symptoms which have been very and life altering and debilitating.  He was seen by Dr. Tillie Rung at [facility] and had an EUS that was fairly unremarkable.  He uses Imodium, which works to some degree, but would like to try something different.  Looks like he has tried Surveyor, minerals in the past.  Upon questioning he drinks a lot of diet soda.  I have asked him to cut out all artificial sweeteners for the next few weeks.  Can use Stevia since that is plant-based.  Will also prescribe Lomotil for him to use in place of the Imodium.  That was sent to his pharmacy.  Other options would be to retry Questran, have him proceed with SIBO breath testing as he does have that kit still at home and has not yet performed that.  He also asked about seeing a dietitian or nutritionist to help get some better dietary ideas in regards to his diabetes and his EPI.   CC:  Aletha Halim., PA-C

## 2022-08-31 NOTE — Patient Instructions (Addendum)
Stop all artifical sweeteners and diet sodas.  We have sent the following medications to your pharmacy for you to pick up at your convenience: Lomotil 2.5 mg 1 tablet three times daily as needed.  _______________________________________________________  If you are age 74 or older, your body mass index should be between 23-30. Your Body mass index is 32.5 kg/m. If this is out of the aforementioned range listed, please consider follow up with your Primary Care Provider.  If you are age 34 or younger, your body mass index should be between 19-25. Your Body mass index is 32.5 kg/m. If this is out of the aformentioned range listed, please consider follow up with your Primary Care Provider.   ________________________________________________________  The [place] [facility] providers would like to encourage you to use [facility] to communicate with providers for non-urgent requests or questions.  Due to long hold times on the telephone, sending your provider a message by [facility] may be a faster and more efficient way to get a response.  Please allow 48 business hours for a response.  Please remember that this is for non-urgent requests.  _______________________________________________________

## 2022-09-01 NOTE — Progress Notes (Signed)
Agree with assessment and plan as outlined.  

## 2022-09-03 NOTE — Addendum Note (Signed)
Addended by: Horris Latino on: 09/03/2022 08:41 AM   Modules accepted: Orders

## 2022-09-26 ENCOUNTER — Other Ambulatory Visit: Payer: Self-pay | Admitting: Gastroenterology

## 2022-09-28 ENCOUNTER — Encounter: Payer: Self-pay | Admitting: Gastroenterology

## 2022-09-28 ENCOUNTER — Ambulatory Visit (INDEPENDENT_AMBULATORY_CARE_PROVIDER_SITE_OTHER): Payer: Medicare Other | Admitting: Gastroenterology

## 2022-09-28 VITALS — BP 130/90 | HR 65 | Ht 70.5 in | Wt 231.5 lb

## 2022-09-28 DIAGNOSIS — K529 Noninfective gastroenteritis and colitis, unspecified: Secondary | ICD-10-CM

## 2022-09-28 MED ORDER — DIPHENOXYLATE-ATROPINE 2.5-0.025 MG PO TABS: 1.0000 | ORAL_TABLET | Freq: Three times a day (TID) | ORAL | 5 refills | Status: DC | PRN

## 2022-09-28 NOTE — Progress Notes (Signed)
09/28/2022 Robert Castro [id number] [birth year]   HISTORY OF PRESENT ILLNESS:  This is a 72 year old male who is a patient of Dr. Doyne Keel.  He has a past medical history including diabetes, GERD, adenomatous duodenal polyp as well as history of Barrett's esophagus, and EPI.  Also has chronic diarrhea thought to be secondary to the EPI.  He has been on Creon, now 6 pills with meals and 2 with snacks, but that has not made much difference in his diarrhea symptoms.  He has had evaluation with EGDs and colonoscopies.  He was actually referred to [facility] and saw Dr. Tillie Rung and underwent EUS there in November, which was really unremarkable.  He has tried Questran in the past.  Was using Imodium with some improvement.  He was seen by me on 08/31/2022 at which time we decided to the put him on Lomotil instead of Imodium.  We also discussed trying to cut out artificial sweeteners with his diet sodas, etc.  He says that he has cut back on the artificial sweeteners, but has not completely eliminated them.  He has been using Lomotil pretty much 3 times a day and has definitely noticed good amount of improvement in his symptoms.  Says that sometimes stools will actually be solid, but sometimes are still explosive.  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    stenosis and arthritis   Depression    Diabetes ([place])    Diverticulosis    ED (erectile dysfunction)    Exocrine pancreatic insufficiency    Fatty liver    GERD (gastroesophageal reflux disease)    HOH (hard of hearing)    Hyperlipidemia    Hypertension    Hypothyroidism    Internal hemorrhoids    Leg edema, left    Macular degeneration    Neuromuscular disorder (HCC)    Osteoarthritis    Peripheral neuropathy    Sleep apnea    cpap   Testosterone  deficiency    Tremors of nervous system    Umbilical hernia    Past Surgical History:  Procedure Laterality Date   BACK SURGERY  2009   BACK SURGERY  09/2014   Dr Marykay Lex   CATARACT EXTRACTION, BILATERAL  12/2016   COLONOSCOPY     ESOPHAGOGASTRODUODENOSCOPY (EGD) WITH PROPOFOL N/A 09/16/2017   Procedure: ESOPHAGOGASTRODUODENOSCOPY (EGD) WITH PROPOFOL;  Surgeon: Yetta Flock, MD;  Location: [facility];  Service: Gastroenterology;  Laterality: N/A;   LUMBAR LAMINECTOMY/ DECOMPRESSION WITH MET-RX  2014   TONSILLECTOMY  1950   UMBILICAL HERNIA REPAIR      reports that he quit smoking about 20 years ago. His smoking use included cigarettes. He has never used smokeless tobacco. He reports current alcohol use of about 14.0 - 28.0 standard drinks of alcohol per week. He reports that he does not use drugs. family history includes Atrial fibrillation in his brother; Cancer (age of onset: 81) in his mother; Heart attack in his maternal grandfather and paternal grandfather; Other in his father. No Known Allergies    Outpatient Encounter Medications as of 09/28/2022  Medication Sig   Alpha Lipoic Acid 200 MG CAPS Take 200 mg by mouth every morning.   anastrozole (ARIMIDEX) 1 MG tablet Take 1 mg daily by mouth.  atorvastatin (LIPITOR) 20 MG tablet Take 20 mg by mouth daily.   chlorhexidine (PERIDEX) 0.12 % solution Use as directed 10 mLs in the mouth or throat 2 (two) times daily as needed.    Cholecalciferol 125 MCG (5000 UT) TABS Take by mouth daily.   cholestyramine (QUESTRAN) 4 g packet Take 1 packet (4 g total) by mouth 2 (two) times daily.   Cyanocobalamin 5000 MCG CAPS Take 2 capsules by mouth in the morning and at bedtime.   diphenoxylate-atropine (LOMOTIL) 2.5-0.025 MG tablet Take 1 tablet by mouth 3 (three) times daily as needed for diarrhea or loose stools.   donepezil (ARICEPT) 10 MG tablet Take 10 mg by mouth daily.   fluocinonide ointment (LIDEX) 1.61 % Apply 1 application  topically 2 (two) times daily as needed (IRRITATION).   fluticasone (FLONASE) 50 MCG/ACT nasal spray Place 1 spray into both nostrils daily as needed for allergies or rhinitis.   folic acid (FOLVITE) 1 MG tablet Take 1 tablet by mouth daily.   gabapentin (NEURONTIN) 100 MG capsule Take 200 mg by mouth at bedtime.   JARDIANCE 10 MG TABS tablet Take 10 mg by mouth daily.   Lancets (ONETOUCH ULTRASOFT) lancets 1 each by Other route daily.   lipase/protease/amylase (CREON) 36000 UNITS CPEP capsule Take 6 capsules with each meal take 2 capsules with snacks   Melatonin 3 MG CAPS Take by mouth 3 (three) times daily.   memantine (NAMENDA) 10 MG tablet Take 10 mg by mouth 2 (two) times daily.   metFORMIN (GLUCOPHAGE-XR) 500 MG 24 hr tablet Take 1,000 mg by mouth 2 (two) times daily.   MILK THISTLE PO Take 1 capsule by mouth daily as needed.   Multiple Vitamin (MULTI VITAMIN DAILY) TABS Take 1 tablet by mouth every morning.   Multiple Vitamins-Minerals (ICAPS AREDS 2 PO) Take 2 capsules by mouth in the morning and at bedtime.   mupirocin ointment (BACTROBAN) 2 % Apply topically 2 (two) times daily as needed. to affected area   olmesartan (BENICAR) 40 MG tablet Take 40 mg by mouth daily.   Omega-3 Fatty Acids (FISH OIL) 1200 MG CAPS Take 1 capsule by mouth daily.   pantoprazole (PROTONIX) 40 MG tablet Take 40 mg by mouth.   sertraline (ZOLOFT) 100 MG tablet Take 200 mg by mouth daily.    silodosin (RAPAFLO) 8 MG CAPS capsule Take 8 mg by mouth daily.   testosterone cypionate (DEPOTESTOTERONE CYPIONATE) 200 MG/ML injection Inject 200 mg into the muscle every 14 (fourteen) days.    Thiamine HCl (B-1) 100 MG TABS Take 2 tablets by mouth daily.   traMADol (ULTRAM) 50 MG tablet    traZODone (DESYREL) 25 mg TABS tablet Take 25 mg by mouth at bedtime.   TURMERIC PO Take 1,000 mg by mouth as needed.   No facility-administered encounter medications on file as of 09/28/2022.   REVIEW OF SYSTEMS  : All other  systems reviewed and negative except where noted in the History of Present Illness.   PHYSICAL EXAM: BP (!) 130/90   Pulse 65   Ht 5' 10.5" (1.791 m)   Wt 231 lb 8 oz (105 kg)   BMI 32.75 kg/m  General: Well developed white male in no acute distress Head: Normocephalic and atraumatic Eyes:  Sclerae anicteric, conjunctiva pink. Ears: Normal auditory acuity Lungs: Clear throughout to auscultation; no W/R/R. Heart: Regular rate and rhythm; no M/R/G. Musculoskeletal:  Abnormal curvature of the spine was noted. Skin: No lesions on visible extremities Extremities:  No edema  Neurological: Alert oriented x 4, grossly non-focal Psychological:  Alert and cooperative. Normal mood and affect  ASSESSMENT AND PLAN: *Exocrine pancreatic insufficiency with chronic diarrhea: Fecal elastase stool study was markedly abnormal.  He is on Creon 6 tablets with meals and 2 with snacks.  He needs to continue this, but this has not allowed improvement in his diarrhea symptoms which have been very and life altering and debilitating.  He was seen by Dr. Tillie Rung at [facility] and had an EUS that was fairly unremarkable.  He was started on Lomotil, which he is using about 3 times a day, 1 tablet at a time and has noticed marked improvement with that.  Says that he still has a lot of Questran at home as well and he can certainly consider adding 1 dose of that daily to start to see if that helps.  He has not yet done the SIBO breath test and we discussed maybe going ahead and proceeding with that since he already has it at home just to check that box and rule it out.  Will send in a new prescription for Lomotil to his pharmacy.  Will determine follow-up pending results of the SIBO breath test.  CC:  Aletha Halim., PA-C

## 2022-09-28 NOTE — Progress Notes (Signed)
Agree with assessment and plan as outlined.  

## 2022-09-28 NOTE — Patient Instructions (Addendum)
Consider performing SIBO breath test  We have sent the following medications to your pharmacy for you to pick up at your convenience:   Lomotil   If your blood pressure at your visit was 140/90 or greater, please contact your primary care physician to follow up on this.  _______________________________________________________  If you are age 74 or older, your body mass index should be between 23-30. Your Body mass index is 32.75 kg/m. If this is out of the aforementioned range listed, please consider follow up with your Primary Care Provider.  If you are age 16 or younger, your body mass index should be between 19-25. Your Body mass index is 32.75 kg/m. If this is out of the aformentioned range listed, please consider follow up with your Primary Care Provider.   ________________________________________________________  The [place] [facility] providers would like to encourage you to use [facility] to communicate with providers for non-urgent requests or questions.  Due to long hold times on the telephone, sending your provider a message by [facility] may be a faster and more efficient way to get a response.  Please allow 48 business hours for a response.  Please remember that this is for non-urgent requests.   Thank you for entrusting me with your care and choosing [facility].  Alonza Bogus PA-C

## 2022-10-10 ENCOUNTER — Other Ambulatory Visit: Payer: Self-pay | Admitting: Gastroenterology

## 2022-10-10 MED ORDER — PANCRELIPASE (LIP-PROT-AMYL) 36000-114000 UNITS PO CPEP: ORAL_CAPSULE | ORAL | 1 refills | Status: DC

## 2023-01-09 ENCOUNTER — Other Ambulatory Visit: Payer: Self-pay | Admitting: Orthopedic Surgery

## 2023-01-09 DIAGNOSIS — M4325 Fusion of spine, thoracolumbar region: Secondary | ICD-10-CM

## 2023-01-09 DIAGNOSIS — M546 Pain in thoracic spine: Secondary | ICD-10-CM

## 2023-01-09 DIAGNOSIS — M4320 Fusion of spine, site unspecified: Secondary | ICD-10-CM

## 2023-01-16 ENCOUNTER — Ambulatory Visit
Admission: RE | Admit: 2023-01-16 | Discharge: 2023-01-16 | Disposition: A | Discharge status: Home / Self Care | Payer: Medicare Other | Source: Ambulatory Visit | Attending: Orthopedic Surgery | Admitting: Orthopedic Surgery

## 2023-01-16 DIAGNOSIS — M4325 Fusion of spine, thoracolumbar region: Secondary | ICD-10-CM

## 2023-01-16 DIAGNOSIS — M546 Pain in thoracic spine: Secondary | ICD-10-CM

## 2023-01-18 ENCOUNTER — Ambulatory Visit
Admission: RE | Admit: 2023-01-18 | Discharge: 2023-01-18 | Disposition: A | Discharge status: Home / Self Care | Payer: Medicare Other | Source: Ambulatory Visit | Attending: Orthopedic Surgery | Admitting: Orthopedic Surgery

## 2023-01-18 DIAGNOSIS — M4320 Fusion of spine, site unspecified: Secondary | ICD-10-CM

## 2023-01-18 DIAGNOSIS — M546 Pain in thoracic spine: Secondary | ICD-10-CM

## 2023-03-13 ENCOUNTER — Other Ambulatory Visit: Payer: Self-pay | Admitting: *Deleted

## 2023-03-13 MED ORDER — PANCRELIPASE (LIP-PROT-AMYL) 36000-114000 UNITS PO CPEP: ORAL_CAPSULE | ORAL | 1 refills | Status: DC

## 2023-03-21 ENCOUNTER — Telehealth: Payer: Self-pay | Admitting: Gastroenterology

## 2023-03-21 MED ORDER — PANCRELIPASE (LIP-PROT-AMYL) 36000-114000 UNITS PO CPEP: ORAL_CAPSULE | ORAL | 1 refills | Status: AC

## 2023-03-21 NOTE — Telephone Encounter (Signed)
Sent 90 day supply refill of Creon to pharmacy. Left message for patient to call office.

## 2023-03-21 NOTE — Telephone Encounter (Signed)
PT needs a refill on Creon. He usually get 1800 but last RX he only got 660. Requesting call back to discuss script. Please advise

## 2023-04-01 ENCOUNTER — Other Ambulatory Visit: Payer: Self-pay | Admitting: Gastroenterology

## 2023-04-22 ENCOUNTER — Other Ambulatory Visit: Payer: Self-pay | Admitting: Gastroenterology

## 2023-05-28 ENCOUNTER — Other Ambulatory Visit: Payer: Self-pay | Admitting: Gastroenterology

## 2023-05-28 NOTE — Telephone Encounter (Signed)
Please sign for refill.

## 2023-11-14 ENCOUNTER — Encounter: Payer: Self-pay | Admitting: Cardiology

## 2023-11-14 ENCOUNTER — Ambulatory Visit: Attending: Cardiology | Admitting: Cardiology

## 2023-11-14 VITALS — BP 132/80 | HR 93 | Resp 16 | Ht 70.0 in | Wt 253.0 lb

## 2023-11-14 DIAGNOSIS — E1142 Type 2 diabetes mellitus with diabetic polyneuropathy: Secondary | ICD-10-CM | POA: Diagnosis present

## 2023-11-14 DIAGNOSIS — I4891 Unspecified atrial fibrillation: Secondary | ICD-10-CM | POA: Diagnosis present

## 2023-11-14 DIAGNOSIS — I739 Peripheral vascular disease, unspecified: Secondary | ICD-10-CM | POA: Diagnosis present

## 2023-11-14 DIAGNOSIS — I10 Essential (primary) hypertension: Secondary | ICD-10-CM | POA: Diagnosis present

## 2023-11-14 MED ORDER — APIXABAN 5 MG PO TABS: 5.0000 mg | ORAL_TABLET | Freq: Two times a day (BID) | ORAL | 0 refills | Status: DC

## 2023-11-14 MED ORDER — APIXABAN 5 MG PO TABS: 5.0000 mg | ORAL_TABLET | Freq: Two times a day (BID) | ORAL | 3 refills | Status: DC

## 2023-11-14 MED ORDER — METOPROLOL SUCCINATE ER 50 MG PO TB24
50.0000 mg | ORAL_TABLET | Freq: Every day | ORAL | 2 refills | Status: DC
Start: 2023-11-14 — End: 2024-01-22

## 2023-11-14 NOTE — Patient Instructions (Signed)
 Medication Instructions:  Your physician has recommended you make the following change in your medication:  Start Eliquis 5 mg by mouth twice daily  Start Metoprolol succinate 50 mg by mouth daily   *If you need a refill on your cardiac medications before your next appointment, please call your pharmacy*   Lab Work: none If you have labs (blood work) drawn today and your tests are completely normal, you will receive your results only by: MyChart Message (if you have MyChart) OR A paper copy in the mail If you have any lab test that is abnormal or we need to change your treatment, we will call you to review the results.   Testing/Procedures: Your physician has requested that you have an echocardiogram. Echocardiography is a painless test that uses sound waves to create images of your heart. It provides your doctor with information about the size and shape of your heart and how well your heart's chambers and valves are working. This procedure takes approximately one hour. There are no restrictions for this procedure. Please do NOT wear cologne, perfume, aftershave, or lotions (deodorant is allowed). Please arrive 15 minutes prior to your appointment time.  Please note: We ask at that you not bring children with you during ultrasound (echo/ vascular) testing. Due to room size and safety concerns, children are not allowed in the ultrasound rooms during exams. Our front office staff cannot provide observation of children in our lobby area while testing is being conducted. An adult accompanying a patient to their appointment will only be allowed in the ultrasound room at the discretion of the ultrasound technician under special circumstances. We apologize for any inconvenience.  Your physician has requested that you have a lower or upper extremity arterial duplex with ABIs. This test is an ultrasound of the arteries in the legs or arms. It looks at arterial blood flow in the legs and arms. Allow one  hour for Lower and Upper Arterial scans. There are no restrictions or special instructions.  Please note: We ask at that you not bring children with you during ultrasound (echo/ vascular) testing. Due to room size and safety concerns, children are not allowed in the ultrasound rooms during exams. Our front office staff cannot provide observation of children in our lobby area while testing is being conducted. An adult accompanying a patient to their appointment will only be allowed in the ultrasound room at the discretion of the ultrasound technician under special circumstances. We apologize for any inconvenience.    Follow-Up: At [facility], you and your health needs are our priority.  As part of our continuing mission to provide you with exceptional heart care, we have created designated Provider Care Teams.  These Care Teams include your primary Cardiologist (physician) and Advanced Practice Providers (APPs -  Physician Assistants and Nurse Practitioners) who all work together to provide you with the care you need, when you need it.  We recommend signing up for the patient portal called "MyChart".  Sign up information is provided on this After Visit Summary.  MyChart is used to connect with patients for Virtual Visits (Telemedicine).  Patients are able to view lab/test results, encounter notes, upcoming appointments, etc.  Non-urgent messages can be sent to your provider as well.   To learn more about what you can do with MyChart, go to [email or web address].    Your next appointment:   April 11 at 2:20  Provider:   Yates Decamp, MD     Other Instructions

## 2023-11-14 NOTE — Progress Notes (Signed)
 Cardiology Office Note:  .   Date:  11/14/2023  ID:  ANTERIO SCHEEL, DOB 1951/02/23, MRN [id number] PCP: Richmond Campbell PA-C  [facility] Providers Cardiologist:  Yates Decamp, MD   History of Present Illness: Robert Castro   Robert Castro is a 73 y.o. patient male patient with hypertension, hypercholesterolemia, OSA on CPAP uncompliant, diabetes mellitus with severe peripheral neuropathy, severe spinal degenerative joint disease with multiple surgeries last surgery on his back was February 2023, referred to me for cardiac risk stratification.  Patient has marked fatigue, severely reduced exercise capacity due to peripheral neuropathy and also chronic back pain, states that he is unable to walk even a little bit without having to stop due to severe pain in his legs and fatigue in his legs bilaterally.  He also complains of being markedly fatigued and tired chronically.  No chest pain, dyspnea or palpitations.  No leg edema.  Labs   External Labs:  Labs 10/21/2023:  BMP normal, creatinine 1.1, EGFR 70 mL, potassium 4.4.  LFTs normal.  A1c 7.4%.  TSH normal at 1.918.  Vitamin D 32.  Total cholesterol 131, triglycerides 196, HDL 45, LDL 59.  Review of Systems  Constitutional: Positive for malaise/fatigue.  Cardiovascular:  Positive for dyspnea on exertion.  Musculoskeletal:  Positive for back pain, joint pain and muscle weakness.  Neurological:  Positive for paresthesias (and absent sensation in his foot due to DM).   Physical Exam:   VS:  BP 132/80 (BP Location: Left Arm, Patient Position: Sitting, Cuff Size: Normal)   Pulse 93   Resp 16   Ht 5\' 10"  (1.778 m)   Wt 253 lb (114.8 kg)   SpO2 95%   BMI 36.30 kg/m    Wt Readings from Last 3 Encounters:  11/14/23 253 lb (114.8 kg)  09/28/22 231 lb 8 oz (105 kg)  08/31/22 226 lb 8 oz (102.7 kg)    Physical Exam Constitutional:      Appearance: He is obese.  Neck:     Vascular: No carotid bruit or JVD.  Cardiovascular:      Rate and Rhythm: Normal rate. Rhythm irregular.     Pulses:          Femoral pulses are 1+ on the right side and 0 on the left side.      Dorsalis pedis pulses are 0 on the right side and 0 on the left side.       Posterior tibial pulses are 0 on the right side and 0 on the left side.     Heart sounds: No murmur heard. Pulmonary:     Effort: Pulmonary effort is normal.     Breath sounds: Normal breath sounds.  Abdominal:     General: Bowel sounds are normal.     Palpations: Abdomen is soft.  Musculoskeletal:     Right lower leg: No edema.     Left lower leg: No edema.  Skin:    Capillary Refill: Capillary refill takes less than 2 seconds.    Studies Reviewed: .    NA EKG:    EKG Interpretation Date/Time:  Thursday November 14 2023 14:49:00 EDT Ventricular Rate:  115 PR Interval:    QRS Duration:  108 QT Interval:  348 QTC Calculation: 481 R Axis:   -60  Text Interpretation: EKG 11/14/2023: Atrial fibrillation with rapid ventricular response at the rate of 115 bpm, left anterior fascicular block, incomplete right bundle branch block.  Poor R progression, cannot exclude  anteroseptal infarct old.  Compared to 05/29/2021, sinus rhythm has been replaced. Confirmed by Delrae Rend [phone]) on 11/14/2023 4:10:49 PM    [facility] EKG by records only: 12/08/2021: NSR  Medications and allergies    No Known Allergies   Current Outpatient Medications:    Alpha Lipoic Acid 200 MG CAPS, Take 200 mg by mouth every morning., Disp: , Rfl:    anastrozole (ARIMIDEX) 1 MG tablet, Take 1 mg daily by mouth., Disp: , Rfl:    apixaban (ELIQUIS) 5 MG TABS tablet, Take 1 tablet (5 mg total) by mouth 2 (two) times daily., Disp: 60 tablet, Rfl: 3   apixaban (ELIQUIS) 5 MG TABS tablet, Take 1 tablet (5 mg total) by mouth 2 (two) times daily., Disp: 56 tablet, Rfl: 0   atorvastatin (LIPITOR) 20 MG tablet, Take 20 mg by mouth daily., Disp: , Rfl:    Cyanocobalamin 5000 MCG CAPS, Take 2 capsules by mouth in  the morning and at bedtime., Disp: , Rfl:    desvenlafaxine (PRISTIQ) 50 MG 24 hr tablet, Take 50 mg by mouth every morning., Disp: , Rfl:    diphenoxylate-atropine (LOMOTIL) 2.5-0.025 MG tablet, TAKE 1 TABLET BY MOUTH THREE TIMES DAILY AS NEEDED FOR DIARRHEA OR LOOSE STOOLS, Disp: 90 tablet, Rfl: 1   donepezil (ARICEPT) 10 MG tablet, Take 10 mg by mouth daily., Disp: , Rfl:    empagliflozin (JARDIANCE) 25 MG TABS tablet, Take 25 mg by mouth daily., Disp: , Rfl:    finasteride (PROSCAR) 5 MG tablet, Take 5 mg by mouth daily., Disp: , Rfl:    fluocinonide ointment (LIDEX) 0.05 %, Apply 1 application topically 2 (two) times daily as needed (IRRITATION)., Disp: , Rfl:    fluticasone (FLONASE) 50 MCG/ACT nasal spray, Place 1 spray into both nostrils daily as needed for allergies or rhinitis., Disp: , Rfl:    folic acid (FOLVITE) 1 MG tablet, Take 1 tablet by mouth daily., Disp: , Rfl:    gabapentin (NEURONTIN) 100 MG capsule, Take 200 mg by mouth at bedtime., Disp: , Rfl:    glimepiride (AMARYL) 1 MG tablet, Take 1 mg by mouth daily with breakfast., Disp: , Rfl:    Lancets (ONETOUCH ULTRASOFT) lancets, 1 each by Other route daily., Disp: , Rfl:    lipase/protease/amylase (CREON) 36000 UNITS CPEP capsule, Take 6 capsules with each meal take 2 capsules with snacks, Disp: 1980 capsule, Rfl: 1   losartan (COZAAR) 50 MG tablet, Take 1 tablet by mouth daily., Disp: , Rfl:    Melatonin 3 MG CAPS, Take by mouth 3 (three) times daily., Disp: , Rfl:    memantine (NAMENDA) 10 MG tablet, Take 10 mg by mouth 2 (two) times daily., Disp: , Rfl:    metFORMIN (GLUCOPHAGE-XR) 500 MG 24 hr tablet, Take 1,000 mg by mouth 2 (two) times daily., Disp: , Rfl:    metoprolol succinate (TOPROL-XL) 50 MG 24 hr tablet, Take 1 tablet (50 mg total) by mouth daily. Take with or immediately following a meal., Disp: 30 tablet, Rfl: 2   MILK THISTLE PO, Take 1 capsule by mouth daily as needed., Disp: , Rfl:    Multiple Vitamin (MULTI  VITAMIN DAILY) TABS, Take 1 tablet by mouth every morning., Disp: 30 tablet, Rfl:    Multiple Vitamins-Minerals (ICAPS AREDS 2 PO), Take 2 capsules by mouth in the morning and at bedtime., Disp: , Rfl:    mupirocin ointment (BACTROBAN) 2 %, Apply topically 2 (two) times daily as needed. to affected area, Disp: ,  Rfl:    pantoprazole (PROTONIX) 40 MG tablet, Take 40 mg by mouth., Disp: , Rfl:    sertraline (ZOLOFT) 100 MG tablet, Take 200 mg by mouth daily. , Disp: , Rfl:    silodosin (RAPAFLO) 8 MG CAPS capsule, Take 8 mg by mouth daily., Disp: , Rfl:    testosterone cypionate (DEPOTESTOTERONE CYPIONATE) 200 MG/ML injection, Inject 200 mg into the muscle every 14 (fourteen) days. , Disp: , Rfl: 1   Thiamine HCl (B-1) 100 MG TABS, Take 2 tablets by mouth daily., Disp: , Rfl:    traMADol (ULTRAM) 50 MG tablet, , Disp: , Rfl:    traZODone (DESYREL) 25 mg TABS tablet, Take 25 mg by mouth at bedtime., Disp: , Rfl:    TURMERIC PO, Take 1,000 mg by mouth as needed., Disp: , Rfl:    ASSESSMENT AND PLAN: .      ICD-10-CM   1. New onset atrial fibrillation (HCC)  I48.91 metoprolol succinate (TOPROL-XL) 50 MG 24 hr tablet    ECHOCARDIOGRAM COMPLETE    2. Primary hypertension  I10 EKG 12-Lead    ECHOCARDIOGRAM COMPLETE    3. Type 2 diabetes mellitus with diabetic polyneuropathy, without long-term current use of insulin (HCC)  E11.42     4. Peripheral artery disease (HCC)  I73.9 VAS [place] ABI WITH/WO TBI    VAS [place] LOWER EXTREMITY ARTERIAL DUPLEX      Meds ordered this encounter  Medications   metoprolol succinate (TOPROL-XL) 50 MG 24 hr tablet    Sig: Take 1 tablet (50 mg total) by mouth daily. Take with or immediately following a meal.    Dispense:  30 tablet    Refill:  2   apixaban (ELIQUIS) 5 MG TABS tablet    Sig: Take 1 tablet (5 mg total) by mouth 2 (two) times daily.    Dispense:  60 tablet    Refill:  3   apixaban (ELIQUIS) 5 MG TABS tablet    Sig: Take 1 tablet (5 mg total) by mouth  2 (two) times daily.    Dispense:  56 tablet    Refill:  0    Lot Number?:   [id number]    Expiration Date?:   01/25/2025   Click Here to Calculate/Change CHADS2VASc Score The patient's CHADS2-VASc score is 4, indicating a 4.8% annual risk of stroke.  Therefore, anticoagulation is recommended.   CHF History: No HTN History: Yes Diabetes History: Yes Stroke History: No Vascular Disease History: Yes   Assessment and Plan  1.  New onset atrial fibrillation, with RVR, duration unknown. With new onset atrial fibrillation, he has got extremely high CHA2DS2-VASc score, we will start him on Eliquis 5 mg twice daily.  Will schedule him for echocardiogram.  He will also need a stress test at some point however would like to wait to control his heart rate and rhythm evaluation first as he is in rapid ventricular response.  I will also start him on metoprolol succinate 50 mg daily.  2.  Primary hypertension Blood pressure is well-controlled, patient is presently on losartan 50 mg daily, which is appropriate.  I have also added metoprolol succinate for heart rate control.  3.  Type 2 diabetes with polyneuropathy Patient has severe peripheral neuropathy due to diabetes mellitus, has dysesthesia and also paresthesia of lower extremities and his activity is markedly limited due to spinal stenosis and sciatica has not.  However his physical examination is markedly abnormal as well.  Diabetes positive to be at  the highest risk for vascular disease as well as coronary artery disease.  Control of diabetes was discussed extensively, Patient is presently on Jardiance and metformin, consider GLP-1 agonist for weight loss.  4.  Peripheral arterial disease I will schedule him for lower extremity arterial duplex and ABI to evaluate for PAD in view of markedly abnormal physical exam.  Part of his leg weakness could also be related to severe peripheral arterial disease.  I like to see him back in 2 to 3 weeks for  follow-up review of complexity of his presentation.  Signed,  Yates Decamp, MD, [facility] 11/14/2023, 4:16 PM [facility] [address] [place], [place] [address] Phone: [phone]. Fax:  [phone]

## 2023-12-02 ENCOUNTER — Ambulatory Visit (HOSPITAL_BASED_OUTPATIENT_CLINIC_OR_DEPARTMENT_OTHER)
Admission: RE | Admit: 2023-12-02 | Discharge: 2023-12-02 | Disposition: A | Discharge status: Home / Self Care | Source: Ambulatory Visit | Attending: Cardiology | Admitting: Cardiology

## 2023-12-02 ENCOUNTER — Ambulatory Visit (HOSPITAL_COMMUNITY)
Admission: RE | Admit: 2023-12-02 | Discharge: 2023-12-02 | Disposition: A | Discharge status: Home / Self Care | Source: Ambulatory Visit | Attending: Cardiology | Admitting: Cardiology

## 2023-12-02 DIAGNOSIS — I739 Peripheral vascular disease, unspecified: Secondary | ICD-10-CM | POA: Diagnosis present

## 2023-12-02 NOTE — Progress Notes (Signed)
 Lower extremity arterial duplex with resting ABI 12/02/2023: Normal triphasic waveforms bilateral lower extremities.  No evidence of stenosis. Normal bilateral ABI, right 1.26, left 1.26, triphasic waveforms at the level of the ankle.

## 2023-12-05 LAB — VAS US ABI WITH/WO TBI
Left ABI: 1.26
Right ABI: 1.26

## 2023-12-06 ENCOUNTER — Ambulatory Visit: Attending: Cardiology | Admitting: Cardiology

## 2023-12-06 ENCOUNTER — Encounter: Payer: Self-pay | Admitting: Cardiology

## 2023-12-06 ENCOUNTER — Encounter: Payer: Self-pay | Admitting: *Deleted

## 2023-12-06 ENCOUNTER — Ambulatory Visit (HOSPITAL_BASED_OUTPATIENT_CLINIC_OR_DEPARTMENT_OTHER)

## 2023-12-06 ENCOUNTER — Encounter (HOSPITAL_COMMUNITY): Payer: Self-pay

## 2023-12-06 VITALS — BP 130/77 | Resp 14 | Ht 70.0 in | Wt 256.8 lb

## 2023-12-06 DIAGNOSIS — I7121 Aneurysm of the ascending aorta, without rupture: Secondary | ICD-10-CM | POA: Diagnosis present

## 2023-12-06 DIAGNOSIS — I10 Essential (primary) hypertension: Secondary | ICD-10-CM | POA: Diagnosis present

## 2023-12-06 DIAGNOSIS — R0609 Other forms of dyspnea: Secondary | ICD-10-CM | POA: Insufficient documentation

## 2023-12-06 DIAGNOSIS — I4891 Unspecified atrial fibrillation: Secondary | ICD-10-CM | POA: Diagnosis present

## 2023-12-06 DIAGNOSIS — D6869 Other thrombophilia: Secondary | ICD-10-CM | POA: Diagnosis present

## 2023-12-06 DIAGNOSIS — I480 Paroxysmal atrial fibrillation: Secondary | ICD-10-CM | POA: Diagnosis not present

## 2023-12-06 DIAGNOSIS — E1142 Type 2 diabetes mellitus with diabetic polyneuropathy: Secondary | ICD-10-CM | POA: Diagnosis present

## 2023-12-06 LAB — ECHOCARDIOGRAM COMPLETE
Area-P 1/2: 2.69 cm2
S' Lateral: 3.6 cm

## 2023-12-06 NOTE — Patient Instructions (Signed)
 Medication Instructions:  Your physician recommends that you continue on your current medications as directed. Please refer to the Current Medication list given to you today.  *If you need a refill on your cardiac medications before your next appointment, please call your pharmacy*  Lab Work: none If you have labs (blood work) drawn today and your tests are completely normal, you will receive your results only by: MyChart Message (if you have MyChart) OR A paper copy in the mail If you have any lab test that is abnormal or we need to change your treatment, we will call you to review the results.  Testing/Procedures: Your physician has requested that you have a lexiscan myoview. For further information please visit [email or web address] Please follow instruction sheet, as given.   Follow-Up: At [facility], you and your health needs are our priority.  As part of our continuing mission to provide you with exceptional heart care, our providers are all part of one team.  This team includes your primary Cardiologist (physician) and Advanced Practice Providers or APPs (Physician Assistants and Nurse Practitioners) who all work together to provide you with the care you need, when you need it.  Your next appointment:   6 month(s)  Provider:   Yates Decamp, MD     We recommend signing up for the patient portal called "MyChart".  Sign up information is provided on this After Visit Summary.  MyChart is used to connect with patients for Virtual Visits (Telemedicine).  Patients are able to view lab/test results, encounter notes, upcoming appointments, etc.  Non-urgent messages can be sent to your provider as well.   To learn more about what you can do with MyChart, go to [email or web address].   Other Instructions        1st Floor: - Lobby - Registration  - Pharmacy  - Lab - Cafe  2nd Floor: - PV Lab - Diagnostic Testing (echo, CT, nuclear med)  3rd Floor: - Vacant  4th  Floor: - TCTS (cardiothoracic surgery) - AFib Clinic - Structural Heart Clinic - Vascular Surgery  - Vascular Ultrasound  5th Floor: - HeartCare Cardiology (general and EP) - Clinical Pharmacy for coumadin, hypertension, lipid, weight-loss medications, and med management appointments    Valet parking services will be available as well.

## 2023-12-06 NOTE — Progress Notes (Signed)
 Cardiology Office Note:  .   Date:  12/06/2023  ID:  Robert Castro, DOB 04-03-1951, MRN [id number] PCP: Richmond Campbell PA-C  [facility] Providers Cardiologist:  Yates Decamp, MD   History of Present Illness: Robert Castro   Robert Castro is a 73 y.o.  patient male patient with hypertension, hypercholesterolemia, OSA on CPAP and compliant, diabetes mellitus with severe peripheral neuropathy, severe spinal degenerative joint disease with multiple surgeries last surgery on his back.  Seen by me on 11/14/2023 and was found to be A-fib with RVR, was started on metoprolol succinate 50 mg daily and Eliquis 5 mg p.o. twice daily, underwent lower extremity arterial duplex, echocardiogram has been scheduled, presents for follow-up.  Discussed the use of AI scribe software for clinical note transcription with the patient, who gave verbal consent to proceed.   He reports feeling 'a little bit better' and walking better since the last visit. He also notes an improvement in his mental state and energy levels, which he attributes to both his personal life and the medication regimen. He has been adhering to the prescribed medication regimen and reports it is working as expected.  Labs   External Labs:  Labs 10/21/2023:   BMP normal, creatinine 1.1, EGFR 70 mL, potassium 4.4.  LFTs normal.   A1c 7.4%.  TSH normal at 1.918.  Vitamin D 32.   Total cholesterol 131, triglycerides 196, HDL 45, LDL 59.  Review of Systems  Cardiovascular:  Positive for dyspnea on exertion (improved). Negative for chest pain and leg swelling.   Physical Exam:   VS:  BP 130/77 (BP Location: Left Arm, Patient Position: Sitting, Cuff Size: Large)   Resp 14   Ht 5\' 10"  (1.778 m)   Wt 256 lb 12.8 oz (116.5 kg)   BMI 36.85 kg/m    Wt Readings from Last 3 Encounters:  12/06/23 256 lb 12.8 oz (116.5 kg)  11/14/23 253 lb (114.8 kg)  09/28/22 231 lb 8 oz (105 kg)    Physical Exam Neck:     Vascular: No carotid bruit or JVD.   Cardiovascular:     Rate and Rhythm: Normal rate and regular rhythm.     Pulses: Intact distal pulses.     Heart sounds: Normal heart sounds. No murmur Castro.    No gallop.  Pulmonary:     Effort: Pulmonary effort is normal.     Breath sounds: Normal breath sounds.  Abdominal:     General: Bowel sounds are normal. There is distension.     Palpations: Abdomen is soft.  Musculoskeletal:     Right lower leg: No edema.     Left lower leg: No edema.    Studies Reviewed: Robert Castro    ECHOCARDIOGRAM COMPLETE 12/06/2023  1. Left ventricular ejection fraction, by estimation, is 55 to 60%. Left ventricular ejection fraction by 3D volume is 55 %. The left ventricle has normal function. The left ventricle has no regional wall motion abnormalities. There is mild concentric left ventricular hypertrophy. Left ventricular diastolic parameters are consistent with Grade I diastolic dysfunction (impaired relaxation). 2. Trivial MR and AI. Normal LA size 3. Aortic dilatation noted. There is mild dilatation of the aortic root, measuring 41 mm. There is mild dilatation of the ascending aorta, measuring 43 mm.  Lower extremity arterial duplex with resting ABI 12/02/2023: Normal triphasic waveforms bilateral lower extremities.  No evidence of stenosis. Normal bilateral ABI, right 1.26, left 1.26, triphasic waveforms at the level of the ankle.  EKG:    EKG Interpretation Date/Time:  Friday December 06 2023 14:27:07 EDT Ventricular Rate:  64 PR Interval:  236 QRS Duration:  116 QT Interval:  434 QTC Calculation: 447 R Axis:   -46  Text Interpretation: EKG 12/06/2023: Sinus rhythm with first-degree AV block at the rate of 64 bpm, left anterior fascicular block.  Incomplete right bundle branch block. Poor R wave progression, probably normal variant in view of LAFB.  Compared to 11/14/2023, A-fib with RVR no longer present. Confirmed by Delrae Rend [phone]) on 12/06/2023 2:48:33 PM    Medications and allergies     No Known Allergies   Current Outpatient Medications:    Alpha Lipoic Acid 200 MG CAPS, Take 200 mg by mouth every morning., Disp: , Rfl:    anastrozole (ARIMIDEX) 1 MG tablet, Take 1 mg daily by mouth., Disp: , Rfl:    apixaban (ELIQUIS) 5 MG TABS tablet, Take 1 tablet (5 mg total) by mouth 2 (two) times daily., Disp: 60 tablet, Rfl: 3   apixaban (ELIQUIS) 5 MG TABS tablet, Take 1 tablet (5 mg total) by mouth 2 (two) times daily., Disp: 56 tablet, Rfl: 0   atorvastatin (LIPITOR) 20 MG tablet, Take 20 mg by mouth daily., Disp: , Rfl:    Cyanocobalamin 5000 MCG CAPS, Take 2 capsules by mouth in the morning and at bedtime., Disp: , Rfl:    desvenlafaxine (PRISTIQ) 50 MG 24 hr tablet, Take 50 mg by mouth every morning., Disp: , Rfl:    diphenoxylate-atropine (LOMOTIL) 2.5-0.025 MG tablet, TAKE 1 TABLET BY MOUTH THREE TIMES DAILY AS NEEDED FOR DIARRHEA OR LOOSE STOOLS, Disp: 90 tablet, Rfl: 1   donepezil (ARICEPT) 10 MG tablet, Take 10 mg by mouth daily., Disp: , Rfl:    empagliflozin (JARDIANCE) 25 MG TABS tablet, Take 25 mg by mouth daily., Disp: , Rfl:    finasteride (PROSCAR) 5 MG tablet, Take 5 mg by mouth daily., Disp: , Rfl:    fluocinonide ointment (LIDEX) 0.05 %, Apply 1 application topically 2 (two) times daily as needed (IRRITATION)., Disp: , Rfl:    fluticasone (FLONASE) 50 MCG/ACT nasal spray, Place 1 spray into both nostrils daily as needed for allergies or rhinitis., Disp: , Rfl:    folic acid (FOLVITE) 1 MG tablet, Take 1 tablet by mouth daily., Disp: , Rfl:    gabapentin (NEURONTIN) 100 MG capsule, Take 200 mg by mouth at bedtime., Disp: , Rfl:    glimepiride (AMARYL) 1 MG tablet, Take 1 mg by mouth daily with breakfast., Disp: , Rfl:    Lancets (ONETOUCH ULTRASOFT) lancets, 1 each by Other route daily., Disp: , Rfl:    lipase/protease/amylase (CREON) 36000 UNITS CPEP capsule, Take 6 capsules with each meal take 2 capsules with snacks, Disp: 1980 capsule, Rfl: 1   losartan  (COZAAR) 50 MG tablet, Take 1 tablet by mouth daily., Disp: , Rfl:    Melatonin 3 MG CAPS, Take by mouth 3 (three) times daily., Disp: , Rfl:    memantine (NAMENDA) 10 MG tablet, Take 10 mg by mouth 2 (two) times daily., Disp: , Rfl:    metFORMIN (GLUCOPHAGE-XR) 500 MG 24 hr tablet, Take 1,000 mg by mouth 2 (two) times daily., Disp: , Rfl:    metoprolol succinate (TOPROL-XL) 50 MG 24 hr tablet, Take 1 tablet (50 mg total) by mouth daily. Take with or immediately following a meal., Disp: 30 tablet, Rfl: 2   MILK THISTLE PO, Take 1 capsule by mouth daily as needed.,  Disp: , Rfl:    Multiple Vitamin (MULTI VITAMIN DAILY) TABS, Take 1 tablet by mouth every morning., Disp: 30 tablet, Rfl:    Multiple Vitamins-Minerals (ICAPS AREDS 2 PO), Take 2 capsules by mouth in the morning and at bedtime., Disp: , Rfl:    mupirocin ointment (BACTROBAN) 2 %, Apply topically 2 (two) times daily as needed. to affected area, Disp: , Rfl:    pantoprazole (PROTONIX) 40 MG tablet, Take 40 mg by mouth., Disp: , Rfl:    sertraline (ZOLOFT) 100 MG tablet, Take 200 mg by mouth daily. , Disp: , Rfl:    silodosin (RAPAFLO) 8 MG CAPS capsule, Take 8 mg by mouth daily., Disp: , Rfl:    testosterone cypionate (DEPOTESTOTERONE CYPIONATE) 200 MG/ML injection, Inject 200 mg into the muscle every 14 (fourteen) days. , Disp: , Rfl: 1   Thiamine HCl (B-1) 100 MG TABS, Take 2 tablets by mouth daily., Disp: , Rfl:    traMADol (ULTRAM) 50 MG tablet, , Disp: , Rfl:    traZODone (DESYREL) 25 mg TABS tablet, Take 25 mg by mouth at bedtime., Disp: , Rfl:    TURMERIC PO, Take 1,000 mg by mouth as needed., Disp: , Rfl:    No orders of the defined types were placed in this encounter.    There are no discontinued medications.   ASSESSMENT AND PLAN: .      ICD-10-CM   1. Paroxysmal atrial fibrillation (HCC)  I48.0 EKG 12-Lead    MYOCARDIAL PERFUSION IMAGING    Cardiac Stress Test: Informed Consent Details: Physician/Practitioner  Attestation; Transcribe to consent form and obtain patient signature    2. Secondary hypercoagulable state (HCC)  D68.69     3. Aneurysm of ascending aorta without rupture (HCC)  I71.21     4. Primary hypertension  I10     5. Type 2 diabetes mellitus with diabetic polyneuropathy, without long-term current use of insulin (HCC)  E11.42 MYOCARDIAL PERFUSION IMAGING    Cardiac Stress Test: Informed Consent Details: Physician/Practitioner Attestation; Transcribe to consent form and obtain patient signature    6. Dyspnea on exertion  R06.09 MYOCARDIAL PERFUSION IMAGING    Cardiac Stress Test: Informed Consent Details: Physician/Practitioner Attestation; Transcribe to consent form and obtain patient signature     1.  Paroxysmal atrial fibrillation (HCC) (Primary) He is currently in regular rhythm, likely due to his medication regimen, with improved energy levels and reduced dyspnea. An EKG confirms regular rhythm, contributing to his overall well-being. Continue the current medication regimen to maintain regular heart rhythm. Educate him on pulse monitoring for arrhythmia and advise reporting any irregularities.  2. Secondary hypercoagulable state (HCC) Presently tolerating Eliquis.  3. Aneurysm of ascending aorta without rupture (HCC) He does have mild ascending aortic aneurysm, will recheck echocardiogram in a year.  I forgot to mention this to him on his visit but not much of concern.  4. Primary hypertension He is overall well-managed with controlled blood pressure and good cardiac function. Follow-up is necessary to monitor ongoing conditions. A stress test is planned to assess for any significant blockages. Schedule a follow-up appointment in six months. Ensure medications are prescribed for 90 days if needed.   With regard to leg pain and back pain, I performed lower extremity arterial duplex and ABI which are essentially normal.  Hence his claudication is pseudoclaudication from spinal  stenosis.  5. Type 2 diabetes mellitus with diabetic polyneuropathy, without long-term current use of insulin (HCC)/Obstructive Sleep Apnea   He also expresses  a desire to restart Ozempic for diabetes management, which he had previously stopped due to successful weight loss. He uses a self-adjusting CPAP machine for sleep apnea and has not seen a sleep specialist in a long time.  I will have Mady Gemma, PA-C to do the needful regarding reinitiatino of Ozempic and also sleep referral. He has been compliannt with CPAP since his last visit with me.    Signed,  Yates Decamp, MD, [facility] 12/06/2023, 3:09 PM [facility] [address] [place], [place] [address] Phone: [phone]. Fax:  [phone]

## 2023-12-12 ENCOUNTER — Ambulatory Visit (HOSPITAL_COMMUNITY): Attending: Cardiology

## 2023-12-12 DIAGNOSIS — R0609 Other forms of dyspnea: Secondary | ICD-10-CM | POA: Insufficient documentation

## 2023-12-12 DIAGNOSIS — I480 Paroxysmal atrial fibrillation: Secondary | ICD-10-CM | POA: Insufficient documentation

## 2023-12-12 DIAGNOSIS — E1142 Type 2 diabetes mellitus with diabetic polyneuropathy: Secondary | ICD-10-CM | POA: Insufficient documentation

## 2023-12-12 MED ORDER — REGADENOSON 0.4 MG/5ML IV SOLN
0.4000 mg | Freq: Once | INTRAVENOUS | Status: AC
  Administered 2023-12-12: 0.4 mg via INTRAVENOUS

## 2023-12-12 MED ORDER — TECHNETIUM TC 99M TETROFOSMIN IV KIT
9.5000 | PACK | Freq: Once | INTRAVENOUS | Status: AC | PRN
Start: 2023-12-12 — End: 2023-12-12
  Administered 2023-12-12: 9.5 via INTRAVENOUS

## 2023-12-12 MED ORDER — TECHNETIUM TC 99M TETROFOSMIN IV KIT
31.8000 | PACK | Freq: Once | INTRAVENOUS | Status: AC | PRN
Start: 2023-12-12 — End: 2023-12-12
  Administered 2023-12-12: 31.8 via INTRAVENOUS

## 2023-12-13 ENCOUNTER — Encounter: Payer: Self-pay | Admitting: Cardiology

## 2023-12-13 LAB — MYOCARDIAL PERFUSION IMAGING
LV dias vol: 130 mL (ref 62–150)
LV sys vol: 61 mL
Nuc Stress EF: 53 %
Peak HR: 82 {beats}/min
Rest HR: 64 {beats}/min
Rest Nuclear Isotope Dose: 9.5 mCi
SDS: 3
SRS: 1
SSS: 4
ST Depression (mm): 0 mm
Stress Nuclear Isotope Dose: 31.8 mCi
TID: 1

## 2023-12-13 NOTE — Progress Notes (Signed)
 Low risk stress test, continue medical therapy

## 2023-12-17 ENCOUNTER — Other Ambulatory Visit: Payer: Self-pay | Admitting: Gastroenterology

## 2023-12-18 NOTE — Telephone Encounter (Signed)
 Please sign

## 2024-01-08 ENCOUNTER — Telehealth: Payer: Self-pay | Admitting: Cardiology

## 2024-01-08 NOTE — Telephone Encounter (Signed)
 Pt called in asking to speak with nurse about his last appt. He has some f/u questions he would like to ask.

## 2024-01-08 NOTE — Telephone Encounter (Signed)
 Attempted to call pt, unable to reach pt. LMTCB.

## 2024-01-09 NOTE — Telephone Encounter (Signed)
 I spoke with patient. He is asking about Ozempic and sleep study that was discussed at last office visit.  Per office note Dr Berry Bristol recommends patient talk with PCP about this.  Patient will contact PCP office

## 2024-01-15 ENCOUNTER — Encounter: Payer: Self-pay | Admitting: Gastroenterology

## 2024-01-15 ENCOUNTER — Ambulatory Visit (INDEPENDENT_AMBULATORY_CARE_PROVIDER_SITE_OTHER): Admitting: Gastroenterology

## 2024-01-15 VITALS — BP 132/80 | HR 79 | Ht 70.0 in | Wt 264.2 lb

## 2024-01-15 DIAGNOSIS — K8681 Exocrine pancreatic insufficiency: Secondary | ICD-10-CM

## 2024-01-15 DIAGNOSIS — F1090 Alcohol use, unspecified, uncomplicated: Secondary | ICD-10-CM

## 2024-01-15 DIAGNOSIS — D132 Benign neoplasm of duodenum: Secondary | ICD-10-CM

## 2024-01-15 DIAGNOSIS — K580 Irritable bowel syndrome with diarrhea: Secondary | ICD-10-CM

## 2024-01-15 DIAGNOSIS — K529 Noninfective gastroenteritis and colitis, unspecified: Secondary | ICD-10-CM

## 2024-01-15 MED ORDER — DIPHENOXYLATE-ATROPINE 2.5-0.025 MG PO TABS: 1.0000 | ORAL_TABLET | Freq: Four times a day (QID) | ORAL | 3 refills | Status: AC | PRN

## 2024-01-15 MED ORDER — RIFAXIMIN 200 MG PO TABS: 200.0000 mg | ORAL_TABLET | Freq: Three times a day (TID) | ORAL | 0 refills | Status: AC

## 2024-01-15 NOTE — Progress Notes (Signed)
 HPI :  73 year old male here for follow-up visit for chronic diarrhea, history of duodenal adenoma.  Recall he presented in Spring 2022 with steatorrhea. Stool lactoferrin was negative, pancreatic fecal elastase was markedly low at 31.  He was started on Creon  at the time.  He denied any abdominal pains with this, did not have any weight loss at the time.  He has no family history of pancreatic cancer.  He has had a history of significant alcohol use in the past.  He also has type 2 diabetes.  MRCP was obtained in May 2022 showing a normal-appearing pancreas.  He was also noted to have hepatic steatosis on that exam as well as cholelithiasis and a benign cyst vs. hemangioma of the spleen.   He has been seen by Dr. Matthews Sons of [facility] for EUS which did not show any concerning pathology.  They were not sure how accurate the fecal elastase was in the setting of diarrhea and repeated it and again it was very low.  He has tried cholestyramine  which has not helped too much, was placed on Lomotil  which has helped him some extent.  Recall over time he has been on escalating doses of Creon .  Now taking 6 capsules of Creon  (36,000 units/caps) with each meal.  He is not sure how much this helps.  He states he averages a bowel movement or 2/day which is usually loose and watery, sometimes see what he thinks is "stearrhea".  He does have uncontrollable accidents at times when he is out of the house and that is what bothers him the most in regards to his quality of life.  He has been taking Lomotil  which does help and when he takes it, he does not take it reliably.  Reviewed his history of alcohol use.  He is continuing to drink 3-4 drinks per day.  I have spoken with him about alcohol use in the past and recommended he abstain.  He does not have any history of cirrhosis we are aware of.  He states he can stop drinking if he thinks it will help his symptoms.  Discussed other options with him, but again appears that  cholestyramine  never really helped him.  We discussed Viberzi but I would not use that in the setting of ongoing alcohol use.  He does have a lot of gas and bloating we discussed possibly trying rifaximin.  His weight is stable, he has not been losing any weight.  His colonoscopy is up-to-date, random biopsies showed no evidence of microscopic colitis, he has been tested negative for celiac disease    Recall he also has history of Barrett's esophagus and numerous adenomatous colon polyps and also has had duodenal adenomas.    He has had a large duodenal EMR for duodenal adenoma done at [facility] in 2019.  He had a follow-up EGDs with mewhich showed no evidence of recurrence.  He has had multiple colonoscopies in the past with numerous polyps as well.      Procedural history : EGD 03/14/2020 - Esophagogastric landmarks identified. - 1 cm hiatal hernia. - Z-line irregular as outlined above. Biopsied. - A few benign appearing gastric polyps. - Small superficial gastric ulcer with no stigmata of bleeding. - Nodular mucosa in the gastric antrum with erosive changes. Biopsied. - Normal stomach otherwise, biopsies taken to rule out H pylori - Mucosal changes in the duodenum as outlined - no high risk lesions appreciated. Biopsied.   Colonoscopy 03/20/2018 - cecal AVM, 21 polyps removed,  fair prep, diverticulosis - 20 polyps adenoma, referred to genetic counselor   EGD 04/15/2018 - duodenal scar with small nodule at edge - biopsies done - no residual adenoma EGD 10/16/17 - Dr. Matthews Sons - piecemal resection of duodenal adenoma via EMR - path c/w adenoma EGD 10/07/2017 - [facility], stomach filled with food, procedure aborted EGD 09/16/2017 - polypectomy was planned but procedure aborted due to size of polyp and spasm, referred to [facility]   EGD 07/15/2017 - irregular z-line, multiple gastric polyps, duodenal polyp,  Colonoscopy 09/18/2012 - small polyps, diverticulosis, one TA - recall in 5 years   Colonoscopy 04/24/19 -  The perianal and digital rectal examinations were normal. - A single large angiodysplastic lesion was found in the cecum. - A diminutive polyp was found in the cecum. The polyp was sessile. The polyp was removed with a cold snare. Resection and retrieval were complete. - A 3 mm polyp was found in the hepatic flexure. The polyp was sessile. The polyp was removed with a cold snare. Resection and retrieval were complete. - Four sessile polyps were found in the transverse colon. The polyps were 3 to 4 mm in size. These polyps were removed with a cold snare. Resection and retrieval were complete. - Multiple small-mouthed diverticula were found in the left colon. - The colon was extremely spastic which prolonged the procedure. - The exam was otherwise without abnormality.   Path c/w adenomas - repeat colonoscopy in 3 years     Fecal pancreatic elastase 31 Fecal lactoferrin negative   MRCP 01/11/21: IMPRESSION: 1. Normal pancreas. 2. Hepatic steatosis. 3. Cholelithiasis without evidence cholecystitis. No choledocholithiasis 4. Indeterminate lesion in the spleen likely represents a benign complex cyst or hemangioma.      EGD 02/28/22: - Esophagogastric landmarks were identified: the Z-line was found at 39 cm, the gastroesophageal junction was found at 40 cm and the upper extent of the gastric folds was found at 40 cm from the incisors. Findings: - The Z-line was irregula with a very short 5mm to 10mm tongue of salmon colored mucosa. Biopsies were taken with a cold forceps for histology. - Areas of ectopic gastric mucosa were found in the upper third of the esophagus. - The exam of the esophagus was otherwise normal. - A single 3 to 4 mm sessile polyp was found in the gastric body. The polyp was removed with a cold biopsy forceps. Resection and retrieval were complete. - Nodular / benign polypoid mucosa was found in the gastric antrum. Biopsies were taken with a cold forceps for histology. - The  exam of the stomach was otherwise normal. - The examined duodenum was normal other than a small duodenal AVM. Biopsies for histology were taken with a cold forceps for evaluation of celiac disease. No polyps.  High risk colon cancer surveillance: Personal history of colonic polyps - numerous adenomas removed (>25) lat 2 exams, last exam 2020 - history of pancreatic insufficiency on replacement enzymes with continued bowel symptoms Colonoscopy 02/28/22: - The examined portion of the ileum was normal. - A single colonic angiodysplastic lesion. - Diverticulosis in the sigmoid colon. - One 3 mm polyp in the ascending colon, removed with a cold snare. Resected and retrieved. - Five 3 to 4 mm polyps in the transverse colon, removed with a cold snare. Resected and retrieved. - Internal hemorrhoids. - Poor air retention in the left colon, residual stool that required lavage. - The examination was otherwise normal. - Biopsies were taken with a cold forceps from  the right colon, left colon and transverse colon for evaluation of microscopic colitis.  1. Surgical [P], duodenal BENIGN DUODENAL MUCOSA WITH NO DIAGNOSTIC ABNORMALITY 2. Surgical [P], gastric antrum GASTROPATHY WITH FOVEOLAR HYPERPLASIA AND CHANGES COMPATIBLE WITH PRIOR EROSION NEGATIVE FOR H. PYLORI, INTESTINAL METAPLASIA, DYSPLASIA AND CARCINOMA 3. Surgical [P], stomach polyp CHANGES COMPATIBLE WITH EARLY FUNDIC GLAND POLYP REACTIVE GASTROPATHY NEGATIVE FOR H. PYLORI, INTESTINAL METAPLASIA, DYSPLASIA AND CARCINOMA 4. Surgical [P], lower esophageal biopsy's, polyp (1) MINIMAL CHRONIC GASTRITIS WITH FOVEOLAR HYPERPLASIA COMPATIBLE WITH HYPERPLASTIC POLYP NEGATIVE FOR SQUAMOUS EPITHELIUM NEGATIVE FOR INTESTINAL METAPLASIA, DYSPLASIA AND CARCINOMA 5. Surgical [P], colon, transverse, ascending, polyp (6) TUBULAR ADENOMAS NEGATIVE FOR HIGH-GRADE DYSPLASIA AND CARCINOMA 6. Surgical [P], colon nos, random sites BENIGN COLONIC MUCOSA WITH NO DIAGNOSTIC  ABNORMALITY    Fecal elastase < 50 in 2023 at [facility]  EUS 07/24/22: ENDOSONOGRAPHIC FINDING: : There was no sign of significant endosonographic  parenchymal or ductal abnormality in the pancreatic  head, genu of the pancreas, pancreatic body and  pancreatic tail. The PD measured 0.9 mm in the head,  2.5 mm in the neck, 1.0 mm in the body, and 0.9 mm in  the tail. There was no sign of significant endosonographic  abnormality in the common bile duct (3.1 mm) and in  the common hepatic duct (2.5 mm). There was no sign of significant endosonographic  abnormality in the ampulla. Endosonographic imaging in the left lobe of the liver  showed no abnormalities. The celiac region was visualized and showed no sign of  significant endosonographic abnormality. Estimated Blood Loss: Estimated blood loss: none. Impression: EGD Impressions: - Normal esophagus. - Normal stomach. - Normal examined duodenum. EUS Impressions: - There was no sign of significant pathology in the  pancreatic head, genu of the pancreas, pancreatic body  and pancreatic tail. No evidence of chronic  pancreatitis or duct abnormality. - There was no sign of significant pathology in the  common bile duct and in the common hepatic duct. - There was no sign of significant pathology in the  ampulla. - Normal visualized portions of the liver. - Normal celiac region. - No specimens collected. Recommendation: - Discharge patient to home (ambulatory). - The findings and recommendations were discussed with  the patient. - Return to [facility] in 3 months for  further follow-up.   CT enterography 04/02/22 -  IMPRESSION: No radiographic evidence of inflammatory bowel disease or other acute findings.   Colonic diverticulosis, without radiographic evidence of diverticulitis.   Bilateral nephrolithiasis. No evidence of ureteral calculi or hydronephrosis.   Cholelithiasis. No radiographic evidence of  cholecystitis.   Aortic Atherosclerosis (ICD10-I70.0).     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    stenosis and arthritis   Depression    Diabetes (HCC)    Diverticulosis    ED (erectile dysfunction)    Exocrine pancreatic insufficiency    Fatty liver    GERD (gastroesophageal reflux disease)    HOH (hard of hearing)    Hyperlipidemia    Hypertension    Hypothyroidism    Internal hemorrhoids    Leg edema, left    Macular degeneration    Neuromuscular disorder (HCC)    Osteoarthritis    Peripheral neuropathy    Sleep apnea  cpap   Testosterone deficiency    Tremors of nervous system    Umbilical hernia      Past Surgical History:  Procedure Laterality Date   BACK SURGERY  2009   BACK SURGERY  09/2014   Dr Genette Kent   CATARACT EXTRACTION, BILATERAL  12/2016   COLONOSCOPY     ESOPHAGOGASTRODUODENOSCOPY (EGD) WITH PROPOFOL  N/A 09/16/2017   Procedure: ESOPHAGOGASTRODUODENOSCOPY (EGD) WITH PROPOFOL ;  Surgeon: Ace Holder, MD;  Location: [facility] END[facility];  Service: Gastroenterology;  Laterality: N/A;   LUMBAR LAMINECTOMY/ DECOMPRESSION WITH MET-RX  2014   TONSILLECTOMY  1956   UMBILICAL HERNIA REPAIR     Family History  Problem Relation Age of Onset   Atrial fibrillation Brother    Cancer Mother 44       abdominal cancer, unk name of cancer type   Other Father        viral encephalysis    Heart attack Maternal Grandfather    Heart attack Paternal Grandfather    Colon cancer Neg Hx    Stomach cancer Neg Hx    Colon polyps Neg Hx    Esophageal cancer Neg Hx    Rectal cancer Neg Hx    Social History   Tobacco Use   Smoking status: Former    Current packs/day: 0.00    Types: Cigarettes    Quit date: 08/25/2002    Years since quitting: 21.4   Smokeless tobacco: Never   Tobacco  comments:    in college only 40 years ago  Vaping Use   Vaping status: Never Used  Substance Use Topics   Alcohol use: Yes    Alcohol/week: 14.0 - 28.0 standard drinks of alcohol    Types: 14 - 28 Standard drinks or equivalent per week    Comment: varies  daily drinker   Drug use: No   Current Outpatient Medications  Medication Sig Dispense Refill   Alpha Lipoic Acid 200 MG CAPS Take 200 mg by mouth every morning.     anastrozole (ARIMIDEX) 1 MG tablet Take 1 mg daily by mouth.     apixaban  (ELIQUIS ) 5 MG TABS tablet Take 1 tablet (5 mg total) by mouth 2 (two) times daily. 60 tablet 3   atorvastatin (LIPITOR) 20 MG tablet Take 20 mg by mouth daily.     Cyanocobalamin 5000 MCG CAPS Take 2 capsules by mouth in the morning and at bedtime.     desvenlafaxine (PRISTIQ) 50 MG 24 hr tablet Take 50 mg by mouth every morning.     diclofenac Sodium (VOLTAREN) 1 % GEL Apply topically daily as needed.     diphenoxylate -atropine  (LOMOTIL ) 2.5-0.025 MG tablet TAKE 1 TABLET BY MOUTH THREE TIMES DAILY AS NEEDED FOR DIARRHEA OR LOOSE STOOLS 90 tablet 0   donepezil (ARICEPT) 10 MG tablet Take 10 mg by mouth daily.     empagliflozin (JARDIANCE) 25 MG TABS tablet Take 25 mg by mouth daily.     finasteride (PROSCAR) 5 MG tablet Take 5 mg by mouth daily.     fluocinonide ointment (LIDEX) 0.05 % Apply 1 application topically 2 (two) times daily as needed (IRRITATION).     fluticasone (FLONASE) 50 MCG/ACT nasal spray Place 1 spray into both nostrils daily as needed for allergies or rhinitis.     folic acid (FOLVITE) 1 MG tablet Take 1 tablet by mouth daily.     gabapentin (NEURONTIN) 100 MG capsule Take 200 mg by mouth at bedtime.     glimepiride (AMARYL) 1  MG tablet Take 1 mg by mouth daily with breakfast.     Lancets (ONETOUCH ULTRASOFT) lancets 1 each by Other route daily.     lipase/protease/amylase (CREON ) 36000 UNITS CPEP capsule Take 6 capsules with each meal take 2 capsules with snacks 1980 capsule 1    losartan (COZAAR) 50 MG tablet Take 1 tablet by mouth daily.     Melatonin 3 MG CAPS Take by mouth 3 (three) times daily.     memantine (NAMENDA) 10 MG tablet Take 10 mg by mouth 2 (two) times daily.     metFORMIN (GLUCOPHAGE-XR) 500 MG 24 hr tablet Take 1,000 mg by mouth 2 (two) times daily.     metoprolol  succinate (TOPROL -XL) 50 MG 24 hr tablet Take 1 tablet (50 mg total) by mouth daily. Take with or immediately following a meal. 30 tablet 2   MILK THISTLE PO Take 1 capsule by mouth daily as needed.     Multiple Vitamin (MULTI VITAMIN DAILY) TABS Take 1 tablet by mouth every morning. 30 tablet    Multiple Vitamins-Minerals (ICAPS AREDS 2 PO) Take 2 capsules by mouth in the morning and at bedtime.     mupirocin ointment (BACTROBAN) 2 % Apply topically 2 (two) times daily as needed. to affected area     pantoprazole (PROTONIX) 40 MG tablet Take 40 mg by mouth.     sertraline (ZOLOFT) 100 MG tablet Take 200 mg by mouth daily.      silodosin (RAPAFLO) 8 MG CAPS capsule Take 8 mg by mouth daily.     testosterone cypionate (DEPOTESTOTERONE CYPIONATE) 200 MG/ML injection Inject 200 mg into the muscle every 14 (fourteen) days.   1   Thiamine HCl (B-1) 100 MG TABS Take 2 tablets by mouth daily.     traMADol (ULTRAM) 50 MG tablet      traZODone (DESYREL) 25 mg TABS tablet Take 25 mg by mouth at bedtime.     TURMERIC PO Take 1,000 mg by mouth as needed.     No current facility-administered medications for this visit.   No Known Allergies   Review of Systems: All systems reviewed and negative except where noted in HPI.   Lab Results  Component Value Date   WBC 7.5 05/29/2021   HGB 15.7 05/29/2021   HCT 47.0 05/29/2021   MCV 102.6 (H) 05/29/2021   PLT 125 (L) 05/29/2021    Lab Results  Component Value Date   NA 142 05/29/2021   CL 104 05/29/2021   K 3.8 05/29/2021   CO2 23 05/29/2021   BUN 21 05/29/2021   CREATININE 0.98 05/29/2021   GFRNONAA >60 05/29/2021   CALCIUM 9.4  05/29/2021   ALBUMIN 4.4 05/29/2021   GLUCOSE 139 (H) 05/29/2021    Lab Results  Component Value Date   ALT 41 05/29/2021   AST 26 05/29/2021   ALKPHOS 55 05/29/2021   BILITOT 1.7 (H) 05/29/2021   More recent labs reviewed in care everywhere, MCV at 100, B12 normal, platelets at 140, AST and ALT are normal  Physical Exam: BP 132/80   Pulse 79   Ht 5\' 10"  (1.778 m)   Wt 264 lb 3.2 oz (119.8 kg)   BMI 37.91 kg/m  Constitutional: Pleasant,well-developed, male in no acute distress. Neurological: Alert and oriented to person place and time. Psychiatric: Normal mood and affect. Behavior is normal.   ASSESSMENT: 73 y.o. male here for assessment of the following  1. Chronic diarrhea   2. Exocrine pancreatic insufficiency  3. Irritable bowel syndrome with diarrhea   4. Alcohol use   5. Duodenal adenoma    Remains unclear if he truly has pancreatic exocrine insufficiency causing this or combination of this and IBS.  His fecal elastase has remained quite low however he does not really get much of any benefit he thinks from Creon .  He has had an MRCP, EUS, enterography study otherwise which have looked okay.  At high doses of Creon  he does not notice a significant difference yet.  We really do not have much room to go with higher doses of pancreatic enzymes, thus I favor more likely IBS could be causing this.  He has had benefit with Lomotil  but does not take it regularly.  I recommend starting Lomotil  and taking 3 or 4 times daily scheduled to slow his bowel down.  He will continue Creon  for right now but once he gets control of symptoms we will try stopping the Creon  and see how he does with Lomotil  alone.  I discussed other options such as Viberzi.  He is not a candidate for that with his alcohol use.  In fact, he has a chronic macrocytosis, chronic alcohol use, he does not have a known history of cirrhosis but I do recommend he completely abstain from alcohol with the symptoms going  on.  We discussed this for a bit and he is agreeable.  Otherwise he has a lot of gas and bloating, if covered by insurance will give him a course of rifaximin 550 mg 3 times daily for 2 weeks to treat component of IBS-D.  Will see if that helps.  Colonoscopy otherwise up-to-date and reassuring.  He is due for repeat EGD in November of this year for surveillance of duodenal adenoma.  PLAN: - continue Creon  - stop EtOH use - refill lomotil  - take 3 or 4 per day scheduled. Once controlled can try weaning off creon  to see if he really needs that - Rifaximin 550mg  TID for 2 weeks if covered by insurance - consider Viberzi but he uses alcohol, can't use it right now and will give him time to come off alcohol - recall EGD November 2025 for adenoma  Christi Coward, MD [facility]

## 2024-01-15 NOTE — Patient Instructions (Addendum)
 Continue Creon   Stop all alcohol use.  We have sent the following medications to your pharmacy for you to pick up at your convenience: Lomotil : Take three to four times a day  Xifaxan 550 mg: take 1 tablet  three times a day for 14 days  You will be due for an EGD in November 2025.  Thank you for entrusting me with your care and for choosing [facility], Dr. Alvester Johnson  If your blood pressure at your visit was 140/90 or greater, please contact your primary care physician to follow up on this. ______________________________________________________  If you are age 75 or older, your body mass index should be between 23-30. Your Body mass index is 37.91 kg/m. If this is out of the aforementioned range listed, please consider follow up with your Primary Care Provider.  If you are age 75 or younger, your body mass index should be between 19-25. Your Body mass index is 37.91 kg/m. If this is out of the aformentioned range listed, please consider follow up with your Primary Care Provider.  ________________________________________________________  The [facility] providers would like to encourage you to use MYCHART to communicate with providers for non-urgent requests or questions.  Due to long hold times on the telephone, sending your provider a message by [facility] may be a faster and more efficient way to get a response.  Please allow 48 business hours for a response.  Please remember that this is for non-urgent requests.  _______________________________________________________  Due to recent changes in healthcare laws, you may see the results of your imaging and laboratory studies on MyChart before your provider has had a chance to review them.  We understand that in some cases there may be results that are confusing or concerning to you. Not all laboratory results come back in the same time frame and the provider may be waiting for multiple results in order to interpret others.   Please give us  48 hours in order for your provider to thoroughly review all the results before contacting the office for clarification of your results.

## 2024-01-22 ENCOUNTER — Other Ambulatory Visit: Payer: Self-pay | Admitting: Cardiology

## 2024-01-22 DIAGNOSIS — I4891 Unspecified atrial fibrillation: Secondary | ICD-10-CM

## 2024-02-22 ENCOUNTER — Other Ambulatory Visit: Payer: Self-pay | Admitting: Cardiology

## 2024-02-24 NOTE — Telephone Encounter (Signed)
 Prescription refill request for Eliquis  received. Indication: PAF Last office visit: 12/06/23  JINNY Bergamo MD Scr: 1.1 on 10/21/23  Epic Age: 73 Weight: 116.5kg  Based on above findings Eliquis  5mg  twice daily is the appropriate dose.  Refill approved.

## 2024-03-18 ENCOUNTER — Ambulatory Visit

## 2024-03-18 ENCOUNTER — Encounter

## 2024-03-18 VITALS — BP 162/91 | HR 79 | Temp 98.4°F | Ht 70.0 in | Wt 256.6 lb

## 2024-03-18 DIAGNOSIS — G4733 Obstructive sleep apnea (adult) (pediatric): Secondary | ICD-10-CM | POA: Diagnosis not present

## 2024-03-18 NOTE — Progress Notes (Signed)
 @Patient  ID: Robert Castro, male    DOB: 1950-10-22, 73 y.o.   MRN: [id number]  Chief Complaint  Patient presents with   Consult    sleep    Referring provider: Orland Norleen Castro, DPM  HPI: Robert Castro is a 73 y/o male with PMH of DM, HTN, Afib on anticoagulation and OSA on CPAP x20 years who presents today to establish care for sleep.  He reports that he had a sleep test 20 years ago that was positive for sleep apnea.  He was initially on a set pressure for his CPAP but in subsequent years has been on AutoPap.  Reports that he tolerates this quite well and unfortunately has been without a machine that works for about 6 weeks.  His typical schedule hide he goes to bed sometime 3 midnight and 4 AM.  He goes to sleep fairly quickly and gets up around 1 PM in the afternoon.  He is having 20 pound weight gain in the last 2 years but reports that this is not affected control or his sleep apnea.  Currently complains of ongoing brain fog, fatigue, increased daytime sleepiness is not having the machine.  He denies chest pain, palpitations, fever, chills, productive or nonproductive,, weight loss, dyspnea on exertion.    TEST/EVENTS : n/a  No Known Allergies  Immunization History  Administered Date(s) Administered    sv, Bivalent, Protein Subunit Rsvpref,pf (Abrysvo) 06/21/2022   Fluad Quad(high Dose 65+) 04/27/2022   Fluad Trivalent(High Dose 65+) 05/02/2023   Fluzone Influenza virus vaccine,trivalent (IIV3), split virus 05/27/2020   Hep A / Hep B 02/28/2018, 04/11/2018, 09/16/2018   Influenza, High Dose Seasonal PF 06/20/2016, 05/30/2017, 05/06/2019   Influenza-Unspecified 06/06/2011, 06/24/2012, 04/28/2013, 05/10/2014, 06/20/2016, 05/30/2017, 05/27/2018, 06/12/2018, 05/22/2021, 04/27/2022   Moderna Covid-19 Fall Seasonal Vaccine 33yrs & older 07/26/2022, 05/02/2023   Moderna Covid-19 Vaccine Bivalent Booster 64yrs & up 05/12/2021   Moderna Sars-Covid-2 Vaccination 09/30/2019, 10/27/2019,  07/06/2020, 01/05/2021   PNEUMOCOCCAL CONJUGATE-20 09/20/2022   Pfizer Covid-19 Vaccine Bivalent Booster 45yrs & up 05/12/2021   Pneumococcal Conjugate-13 06/10/2015, 02/08/2016   Pneumococcal Polysaccharide-23 04/16/2012, 06/10/2015   Pneumococcal-Unspecified 04/16/2012, 06/10/2015   RSV IGIV 06/21/2022   Td (Adult),5 Lf Tetanus Toxid, Preservative Free 09/14/2003   Tdap 05/10/2014, 02/08/2016   Zoster Recombinant(Shingrix) 05/30/2017, 12/19/2017, 01/23/2018   Zoster, Live 04/30/2011   Zoster, Unspecified 05/30/2017, 12/19/2017, 01/23/2018    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    stenosis and arthritis   Depression    Diabetes (HCC)    Diverticulosis    ED (erectile dysfunction)    Exocrine pancreatic insufficiency    Fatty liver    GERD (gastroesophageal reflux disease)    HOH (hard of hearing)    Hyperlipidemia    Hypertension    Hypothyroidism    Internal hemorrhoids    Leg edema, left    Macular degeneration    Neuromuscular disorder (HCC)    Osteoarthritis    Peripheral neuropathy    Sleep apnea    cpap   Testosterone deficiency    Tremors of nervous system    Umbilical hernia     Tobacco History: Social History   Tobacco Use  Smoking Status Former   Current packs/day: 0.00   Types: Cigarettes   Quit date:  08/25/2002   Years since quitting: 21.5  Smokeless Tobacco Never  Tobacco Comments   in college only 40 years ago   Counseling given: Not Answered Tobacco comments: in college only 40 years ago   Outpatient Medications Prior to Visit  Medication Sig Dispense Refill   Alpha Lipoic Acid 200 MG CAPS Take 200 mg by mouth every morning.     anastrozole (ARIMIDEX) 1 MG tablet Take 1 mg daily by mouth.     apixaban  (ELIQUIS ) 5 MG TABS tablet TAKE 1 TABLET(5 MG) BY MOUTH  TWICE DAILY 60 tablet 5   atorvastatin (LIPITOR) 20 MG tablet Take 20 mg by mouth daily.     Cyanocobalamin 5000 MCG CAPS Take 2 capsules by mouth in the morning and at bedtime.     desvenlafaxine (PRISTIQ) 50 MG 24 hr tablet Take 50 mg by mouth every morning.     diclofenac Sodium (VOLTAREN) 1 % GEL Apply topically daily as needed.     diphenoxylate -atropine  (LOMOTIL ) 2.5-0.025 MG tablet Take 1 tablet by mouth 4 (four) times daily as needed for diarrhea or loose stools. 120 tablet 3   donepezil (ARICEPT) 10 MG tablet Take 10 mg by mouth daily.     empagliflozin (JARDIANCE) 25 MG TABS tablet Take 25 mg by mouth daily.     finasteride (PROSCAR) 5 MG tablet Take 5 mg by mouth daily.     fluocinonide ointment (LIDEX) 0.05 % Apply 1 application topically 2 (two) times daily as needed (IRRITATION).     fluticasone (FLONASE) 50 MCG/ACT nasal spray Place 1 spray into both nostrils daily as needed for allergies or rhinitis.     folic acid (FOLVITE) 1 MG tablet Take 1 tablet by mouth daily.     gabapentin (NEURONTIN) 100 MG capsule Take 200 mg by mouth at bedtime.     glimepiride (AMARYL) 1 MG tablet Take 1 mg by mouth daily with breakfast.     Lancets (ONETOUCH ULTRASOFT) lancets 1 each by Other route daily.     lipase/protease/amylase (CREON ) 36000 UNITS CPEP capsule Take 6 capsules with each meal take 2 capsules with snacks 1980 capsule 1   losartan (COZAAR) 50 MG tablet Take 1 tablet by mouth daily.     Melatonin 3 MG CAPS Take by mouth 3 (three) times daily.     memantine (NAMENDA) 10 MG tablet Take 10 mg by mouth 2 (two) times daily.     metFORMIN (GLUCOPHAGE-XR) 500 MG 24 hr tablet Take 1,000 mg by mouth 2 (two) times daily.     metoprolol  succinate (TOPROL -XL) 50 MG 24 hr tablet TAKE 1 TABLET(50 MG) BY MOUTH DAILY WITH OR IMMEDIATELY FOLLOWING A MEAL 90 tablet 3   Multiple Vitamin (MULTI VITAMIN DAILY) TABS Take 1 tablet by mouth every morning. 30 tablet    Multiple Vitamins-Minerals (ICAPS  AREDS 2 PO) Take 2 capsules by mouth in the morning and at bedtime.     mupirocin ointment (BACTROBAN) 2 % Apply topically 2 (two) times daily as needed. to affected area     pantoprazole (PROTONIX) 40 MG tablet Take 40 mg by mouth.     sertraline (ZOLOFT) 100 MG tablet Take 200 mg by mouth daily.      silodosin (RAPAFLO) 8 MG CAPS capsule Take 8 mg by mouth daily.     testosterone cypionate (DEPOTESTOTERONE CYPIONATE) 200 MG/ML injection Inject 200 mg into the muscle every 14 (fourteen) days.   1   Thiamine HCl (B-1) 100 MG TABS Take 2 tablets  by mouth daily.     traMADol (ULTRAM) 50 MG tablet      traZODone (DESYREL) 25 mg TABS tablet Take 25 mg by mouth at bedtime.     TURMERIC PO Take 1,000 mg by mouth as needed.     MILK THISTLE PO Take 1 capsule by mouth daily as needed. (Patient not taking: Reported on 03/18/2024)     No facility-administered medications prior to visit.     Review of Systems: Positive as mentioned in the HPI  Constitutional:   No  weight loss, night sweats,  Fevers, chills, fatigue, or  lassitude.  HEENT:   No headaches,  Difficulty swallowing,  Tooth/dental problems, or  Sore throat,                No sneezing, itching, ear ache, nasal congestion, post nasal drip,   CV:  No chest pain,  Orthopnea, PND, swelling in lower extremities, anasarca, dizziness, palpitations, syncope.   GI  No heartburn, indigestion, abdominal pain, nausea, vomiting, diarrhea, change in bowel habits, loss of appetite, bloody stools.   Resp: No shortness of breath with exertion or at rest.  No excess mucus, no productive cough,  No non-productive cough,  No coughing up of blood.  No change in color of mucus.  No wheezing.  No chest wall deformity  Skin: no rash or lesions.  GU: no dysuria, change in color of urine, no urgency or frequency.  No flank pain, no hematuria   MS:  No joint pain or swelling.  No decreased range of motion.  No back pain.    Physical Exam  BP (!) 162/91  (BP Location: Left Arm, Patient Position: Sitting, Cuff Size: Normal)   Pulse 79   Temp 98.4 F (36.9 C) (Oral)   Ht 5' 10 (1.778 m)   Wt 256 lb 9.6 oz (116.4 kg)   SpO2 94%   BMI 36.82 kg/m   GEN: A/Ox3; pleasant , NAD, well nourished    HEENT:  [place]/AT,  EACs-clear, TMs-wnl, NOSE-clear, THROAT-clear, no lesions, no postnasal drip or exudate noted.  Mallampati 3  NECK:  Supple w/ fair ROM; no JVD; normal carotid impulses w/o bruits; no thyromegaly or nodules palpated; no lymphadenopathy.    RESP  Clear  P & A; w/o, wheezes/ rales/ or rhonchi. no accessory muscle use, no dullness to percussion  CARD:  RRR, no m/r/g, no peripheral edema, pulses intact, no cyanosis or clubbing.  GI:   Soft & nt; nml bowel sounds; no organomegaly or masses detected.   Musco: Warm bil, no deformities or joint swelling noted.   Neuro: alert, no focal deficits noted.    Skin: Warm, no lesions or rashes    Lab Results:  CBC    Component Value Date/Time   WBC 7.5 05/29/2021 0132   RBC 4.58 05/29/2021 0132   HGB 15.7 05/29/2021 0132   HCT 47.0 05/29/2021 0132   PLT 125 (L) 05/29/2021 0132   MCV 102.6 (H) 05/29/2021 0132   MCH 34.3 (H) 05/29/2021 0132   MCHC 33.4 05/29/2021 0132   RDW 13.4 05/29/2021 0132   LYMPHSABS 1.4 05/29/2021 0132   MONOABS 1.2 (H) 05/29/2021 0132   EOSABS 0.0 05/29/2021 0132   BASOSABS 0.0 05/29/2021 0132    BMET    Component Value Date/Time   NA 142 05/29/2021 0132   K 3.8 05/29/2021 0132   CL 104 05/29/2021 0132   CO2 23 05/29/2021 0132   GLUCOSE 139 (H) 05/29/2021 0132  BUN 21 05/29/2021 0132   CREATININE 0.98 05/29/2021 0132   CREATININE 1.25 (H) 02/22/2020 1426   CALCIUM 9.4 05/29/2021 0132   GFRNONAA >60 05/29/2021 0132   GFRNONAA 58 (L) 02/22/2020 1426   GFRAA >60 02/22/2020 1426    BNP No results found for: BNP  ProBNP No results found for: PROBNP  Imaging: No results found.  Administration History     None           No data  to display          No results found for: NITRICOXIDE   Assessment & Plan:   Assessment & Plan OSA (obstructive sleep apnea) -  Previous sleep study not available for review; no compliance download as he has no SD card in his current machine -  Plan for HST;  device given to patient at today's appointment -  Resume CPAP pending HST results -  Reviewed sleep hygiene recommendations -  Follow up for compliance download review in 31-90 days after resuming PAP therapy   Return in about 2 months (around 05/19/2024) for sleep study review; compliance download.  Candis Dandy, PA-C 03/18/2024

## 2024-03-18 NOTE — Patient Instructions (Signed)
 Complete home sleep test as ordered.  Follow up in 31-90 days after starting CPAP for download review.

## 2024-03-19 ENCOUNTER — Encounter

## 2024-03-20 ENCOUNTER — Ambulatory Visit

## 2024-03-20 DIAGNOSIS — G4733 Obstructive sleep apnea (adult) (pediatric): Secondary | ICD-10-CM

## 2024-03-27 DIAGNOSIS — G4733 Obstructive sleep apnea (adult) (pediatric): Secondary | ICD-10-CM | POA: Diagnosis not present

## 2024-04-07 ENCOUNTER — Telehealth: Payer: Self-pay

## 2024-04-07 DIAGNOSIS — G4733 Obstructive sleep apnea (adult) (pediatric): Secondary | ICD-10-CM

## 2024-04-07 NOTE — Telephone Encounter (Signed)
 Home sleep test reviewed and demonstrates severe OSA with AHI 59.6/hr and O2 sat nadir of 69%.  AutoCPAP ordered today:  min 5 cm H2O max 15 cm H2O  Follow up as scheduled 31-90 days after starting the new machine.  Mychart message sent as well.

## 2024-05-05 ENCOUNTER — Ambulatory Visit: Admitting: Internal Medicine

## 2024-05-19 ENCOUNTER — Ambulatory Visit (INDEPENDENT_AMBULATORY_CARE_PROVIDER_SITE_OTHER)

## 2024-05-19 ENCOUNTER — Encounter (HOSPITAL_BASED_OUTPATIENT_CLINIC_OR_DEPARTMENT_OTHER): Payer: Self-pay

## 2024-05-19 VITALS — BP 142/79 | HR 81 | Ht 70.0 in | Wt 250.0 lb

## 2024-05-19 DIAGNOSIS — Z87891 Personal history of nicotine dependence: Secondary | ICD-10-CM | POA: Diagnosis not present

## 2024-05-19 DIAGNOSIS — G4733 Obstructive sleep apnea (adult) (pediatric): Secondary | ICD-10-CM

## 2024-05-19 NOTE — Progress Notes (Signed)
 @Patient  ID: Robert Castro, male    DOB: Jun 03, 1951, 73 y.o.   MRN: [id number]  Chief Complaint  Patient presents with   Sleep Apnea    Follow up     Referring provider: Debrah Josette ORN., PA-C  HPI: Robert Castro. Robert Castro is a 73 y/o male with PMH of DM, HTN, Afib on anticoagulation and OSA on CPAP who presents today in follow up.  Since his last visit he completed his sleep test which indicated severe OSA with AHI 59.6/hr and O2 sat nadir of 69%.  Replacement CPAP was ordered for him given the severity of his OSA and his prior tolerance of CPAP for management of his OSA.   He reports he has had the machine for at least a month and feels that he has more piece of mind now that he has the machine.  He feels his fatigue and brain fog have somewhat improved and he is deriving benefit from the machine.  Today we reviewed a compliance download which indicates good compliance.  AHI of 0 noted on his machine today; average usage of 9 hours or more per night.   OFV 03/18/2024: Robert Castro is a 73 y/o male with PMH of DM, HTN, Afib on anticoagulation and OSA on CPAP x20 years who presents today to establish care for sleep.  He reports that he had a sleep test 20 years ago that was positive for sleep apnea.  He was initially on a set pressure for his CPAP but in subsequent years has been on AutoPap.  Reports that he tolerates this quite well and unfortunately has been without a machine that works for about 6 weeks.  His typical schedule hide he goes to bed sometime 3 midnight and 4 AM.  He goes to sleep fairly quickly and gets up around 1 PM in the afternoon.  He is having 20 pound weight gain in the last 2 years but reports that this is not affected control or his sleep apnea.  Currently complains of ongoing brain fog, fatigue, increased daytime sleepiness is not having the machine.  He denies chest pain, palpitations, fever, chills, productive or nonproductive,, weight loss, dyspnea on exertion.     TEST/EVENTS : HST on 03/23/2024:  Severe OSA 59.6/hr with O2 sat nadir of 69%  No Known Allergies  Immunization History  Administered Date(s) Administered    sv, Bivalent, Protein Subunit Rsvpref,pf (Abrysvo) 06/21/2022   Fluad Quad(high Dose 65+) 04/27/2022   Fluad Trivalent(High Dose 65+) 05/02/2023   Fluzone Influenza virus vaccine,trivalent (IIV3), split virus 05/27/2020   Hep A / Hep B 02/28/2018, 04/11/2018, 09/16/2018   INFLUENZA, HIGH DOSE SEASONAL PF 06/20/2016, 05/30/2017, 05/06/2019   Influenza-Unspecified 06/06/2011, 06/24/2012, 04/28/2013, 05/10/2014, 06/20/2016, 05/30/2017, 05/27/2018, 06/12/2018, 05/22/2021, 04/27/2022   Moderna Covid-19 Fall Seasonal Vaccine 19yrs & older 07/26/2022, 05/02/2023   Moderna Covid-19 Vaccine Bivalent Booster 46yrs & up 05/12/2021   Moderna Sars-Covid-2 Vaccination 09/30/2019, 10/27/2019, 07/06/2020, 01/05/2021   PNEUMOCOCCAL CONJUGATE-20 09/20/2022   Pfizer Covid-19 Vaccine Bivalent Booster 8yrs & up 05/12/2021   Pneumococcal Conjugate-13 06/10/2015, 02/08/2016   Pneumococcal Polysaccharide-23 04/16/2012, 06/10/2015   Pneumococcal-Unspecified 04/16/2012, 06/10/2015   RSV IGIV 06/21/2022   Td (Adult),5 Lf Tetanus Toxid, Preservative Free 09/14/2003   Tdap 05/10/2014, 02/08/2016   Zoster Recombinant(Shingrix) 05/30/2017, 12/19/2017, 01/23/2018   Zoster, Live 04/30/2011   Zoster, Unspecified 05/30/2017, 12/19/2017, 01/23/2018    Past Medical History:  Diagnosis Date   Adenomatous duodenal polyp    Arthritis    Barrett's esophagus  Benign enlargement of prostate    Cataract    both eyes   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    stenosis and arthritis   Depression    Diabetes (HCC)    Diverticulosis    ED (erectile dysfunction)    Exocrine pancreatic insufficiency    Fatty liver    GERD (gastroesophageal reflux disease)    HOH (hard of hearing)    Hyperlipidemia    Hypertension    Hypothyroidism    Internal  hemorrhoids    Leg edema, left    Macular degeneration    Neuromuscular disorder (HCC)    Osteoarthritis    Peripheral neuropathy    Sleep apnea    cpap   Testosterone deficiency    Tremors of nervous system    Umbilical hernia     Tobacco History: Social History   Tobacco Use  Smoking Status Former   Current packs/day: 0.00   Types: Cigarettes   Quit date: 08/25/2002   Years since quitting: 21.7  Smokeless Tobacco Never  Tobacco Comments   in college only 40 years ago   Counseling given: Not Answered Tobacco comments: in college only 40 years ago   Outpatient Medications Prior to Visit  Medication Sig Dispense Refill   Alpha Lipoic Acid 200 MG CAPS Take 200 mg by mouth every morning.     anastrozole (ARIMIDEX) 1 MG tablet Take 1 mg daily by mouth.     apixaban  (ELIQUIS ) 5 MG TABS tablet TAKE 1 TABLET(5 MG) BY MOUTH TWICE DAILY 60 tablet 5   atorvastatin (LIPITOR) 20 MG tablet Take 20 mg by mouth daily.     Cyanocobalamin 5000 MCG CAPS Take 2 capsules by mouth in the morning and at bedtime.     desvenlafaxine (PRISTIQ) 50 MG 24 hr tablet Take 50 mg by mouth every morning.     diclofenac Sodium (VOLTAREN) 1 % GEL Apply topically daily as needed.     diphenoxylate -atropine  (LOMOTIL ) 2.5-0.025 MG tablet Take 1 tablet by mouth 4 (four) times daily as needed for diarrhea or loose stools. 120 tablet 3   donepezil (ARICEPT) 10 MG tablet Take 10 mg by mouth daily.     empagliflozin (JARDIANCE) 25 MG TABS tablet Take 25 mg by mouth daily.     finasteride (PROSCAR) 5 MG tablet Take 5 mg by mouth daily.     fluocinonide ointment (LIDEX) 0.05 % Apply 1 application topically 2 (two) times daily as needed (IRRITATION).     fluticasone (FLONASE) 50 MCG/ACT nasal spray Place 1 spray into both nostrils daily as needed for allergies or rhinitis.     folic acid (FOLVITE) 1 MG tablet Take 1 tablet by mouth daily.     gabapentin (NEURONTIN) 100 MG capsule Take 200 mg by mouth at bedtime.      glimepiride (AMARYL) 1 MG tablet Take 1 mg by mouth daily with breakfast.     Lancets (ONETOUCH ULTRASOFT) lancets 1 each by Other route daily.     lipase/protease/amylase (CREON ) 36000 UNITS CPEP capsule Take 6 capsules with each meal take 2 capsules with snacks 1980 capsule 1   losartan (COZAAR) 50 MG tablet Take 1 tablet by mouth daily.     Melatonin 3 MG CAPS Take by mouth 3 (three) times daily.     memantine (NAMENDA) 10 MG tablet Take 10 mg by mouth 2 (two) times daily.     metFORMIN (GLUCOPHAGE-XR) 500 MG 24 hr tablet Take 1,000 mg by mouth 2 (two)  times daily.     metoprolol  succinate (TOPROL -XL) 50 MG 24 hr tablet TAKE 1 TABLET(50 MG) BY MOUTH DAILY WITH OR IMMEDIATELY FOLLOWING A MEAL 90 tablet 3   MILK THISTLE PO Take 1 capsule by mouth daily as needed.     Multiple Vitamin (MULTI VITAMIN DAILY) TABS Take 1 tablet by mouth every morning. 30 tablet    Multiple Vitamins-Minerals (ICAPS AREDS 2 PO) Take 2 capsules by mouth in the morning and at bedtime.     mupirocin ointment (BACTROBAN) 2 % Apply topically 2 (two) times daily as needed. to affected area     pantoprazole (PROTONIX) 40 MG tablet Take 40 mg by mouth.     sertraline (ZOLOFT) 100 MG tablet Take 200 mg by mouth daily.      silodosin (RAPAFLO) 8 MG CAPS capsule Take 8 mg by mouth daily.     testosterone cypionate (DEPOTESTOTERONE CYPIONATE) 200 MG/ML injection Inject 200 mg into the muscle every 14 (fourteen) days.   1   Thiamine HCl (B-1) 100 MG TABS Take 2 tablets by mouth daily.     traMADol (ULTRAM) 50 MG tablet      traZODone (DESYREL) 25 mg TABS tablet Take 25 mg by mouth at bedtime.     TURMERIC PO Take 1,000 mg by mouth as needed.     No facility-administered medications prior to visit.     Review of Systems: as per HPI  Constitutional:   No  weight loss, night sweats,  Fevers, chills, fatigue, or  lassitude.  HEENT:   No headaches,  Difficulty swallowing,  Tooth/dental problems, or  Sore throat,                 No sneezing, itching, ear ache, nasal congestion, post nasal drip,   CV:  No chest pain,  Orthopnea, PND, swelling in lower extremities, anasarca, dizziness, palpitations, syncope.   GI  No heartburn, indigestion, abdominal pain, nausea, vomiting, diarrhea, change in bowel habits, loss of appetite, bloody stools.   Resp: No shortness of breath with exertion or at rest.  No excess mucus, no productive cough,  No non-productive cough,  No coughing up of blood.  No change in color of mucus.  No wheezing.  No chest wall deformity  Skin: no rash or lesions.  GU: no dysuria, change in color of urine, no urgency or frequency.  No flank pain, no hematuria   MS:  No joint pain or swelling.  No decreased range of motion.  No back pain.    Physical Exam  BP (!) 148/80   Pulse 81   Ht 5' 10 (1.778 m)   Wt 250 lb (113.4 kg)   SpO2 97%   BMI 35.87 kg/m   GEN: A/Ox3; pleasant , NAD, well nourished    HEENT:  [place]/AT,  EACs-clear, TMs-wnl, NOSE-clear, THROAT-clear, no lesions, no postnasal drip or exudate noted.   NECK:  Supple w/ fair ROM; no JVD; normal carotid impulses w/o bruits; no thyromegaly or nodules palpated; no lymphadenopathy.    RESP  Clear  P & A; w/o, wheezes/ rales/ or rhonchi. no accessory muscle use, no dullness to percussion  CARD:  RRR, no m/r/g, no peripheral edema, pulses intact, no cyanosis or clubbing.  GI:   Soft & nt; nml bowel sounds; no organomegaly or masses detected.   Musco: Warm bil, no deformities or joint swelling noted.   Neuro: alert, no focal deficits noted.    Skin: Warm, no lesions or rashes  Lab Results:  CBC    Component Value Date/Time   WBC 7.5 05/29/2021 0132   RBC 4.58 05/29/2021 0132   HGB 15.7 05/29/2021 0132   HCT 47.0 05/29/2021 0132   PLT 125 (L) 05/29/2021 0132   MCV 102.6 (H) 05/29/2021 0132   MCH 34.3 (H) 05/29/2021 0132   MCHC 33.4 05/29/2021 0132   RDW 13.4 05/29/2021 0132   LYMPHSABS 1.4 05/29/2021 0132    MONOABS 1.2 (H) 05/29/2021 0132   EOSABS 0.0 05/29/2021 0132   BASOSABS 0.0 05/29/2021 0132    BMET    Component Value Date/Time   NA 142 05/29/2021 0132   K 3.8 05/29/2021 0132   CL 104 05/29/2021 0132   CO2 23 05/29/2021 0132   GLUCOSE 139 (H) 05/29/2021 0132   BUN 21 05/29/2021 0132   CREATININE 0.98 05/29/2021 0132   CREATININE 1.25 (H) 02/22/2020 1426   CALCIUM 9.4 05/29/2021 0132   GFRNONAA >60 05/29/2021 0132   GFRNONAA 58 (L) 02/22/2020 1426   GFRAA >60 02/22/2020 1426    BNP No results found for: BNP  ProBNP No results found for: PROBNP  Imaging: No results found.  Administration History     None           No data to display          No results found for: NITRICOXIDE   Assessment & Plan:   Assessment & Plan OSA (obstructive sleep apnea) -  Continue current settings on CPAP (AutoPAP- average of 10.1 cm H2O) -  Provided information regarding CPAP care and maintenance today -  Follow up in 6 months; sooner if new or worsening symptoms.   Return in about 6 months (around 11/16/2024) for compliance download.  Candis Dandy, PA-C 05/19/2024

## 2024-05-19 NOTE — Patient Instructions (Addendum)
 Continue CPAP usage nightly on current settings.  Follow up in 6 months; return to clinic sooner if new or worsening symptoms.  For machine troubleshooting, please contact the DME company that provided the machine.   CPAP cleaning:  To clean your CPAP equipment, wash the mask and humidifier chamber daily with warm water and mild soap, rinsing thoroughly and air-drying. Disinfect weekly using a vinegar and water solution (1 part white vinegar to 3 parts water), soaking for 20-30 minutes before rinsing and drying. Regularly clean the air tubing with soap and water and replace the machine's filters weekly or as recommended by the manufacturer. Always use distilled water in the humidifier and clean the machine itself with a damp cloth, never putting electrical components in water

## 2024-05-25 ENCOUNTER — Telehealth: Payer: Self-pay

## 2024-05-25 NOTE — Telephone Encounter (Signed)
 Pt was sent to [facility] before he wants to switch to [facility]?

## 2024-05-25 NOTE — Telephone Encounter (Signed)
 Copied from CRM [id number]. Topic: Clinical - Order For Equipment >> May 22, 2024  3:46 PM Isabell A wrote: Reason for CRM: Patient states in order to get his machine setup by [facility] theyre requiring a copy of his sleep study, face to face notes & a prescription of some type to become one of their patients.   Fax number:  [phone]

## 2024-05-25 NOTE — Telephone Encounter (Signed)
 Sent to [facility]. NFN

## 2024-08-04 ENCOUNTER — Encounter: Payer: Self-pay | Admitting: Gastroenterology

## 2024-09-21 ENCOUNTER — Ambulatory Visit: Admitting: Gastroenterology

## 2024-09-25 ENCOUNTER — Encounter (HOSPITAL_BASED_OUTPATIENT_CLINIC_OR_DEPARTMENT_OTHER): Payer: Self-pay

## 2024-09-25 ENCOUNTER — Ambulatory Visit: Admitting: Gastroenterology

## 2024-09-25 NOTE — Progress Notes (Unsigned)
 "  Chief Complaint: Primary GI MD: Dr. Leigh  HPI:      Discussed the use of AI scribe software for clinical note transcription with the patient, who gave verbal consent to proceed.  History of Present Illness      PREVIOUS GI WORKUP   EGD 03/14/2020 - Esophagogastric landmarks identified. - 1 cm hiatal hernia. - Z-line irregular as outlined above. Biopsied. - A few benign appearing gastric polyps. - Small superficial gastric ulcer with no stigmata of bleeding. - Nodular mucosa in the gastric antrum with erosive changes. Biopsied. - Normal stomach otherwise, biopsies taken to rule out H pylori - Mucosal changes in the duodenum as outlined - no high risk lesions appreciated. Biopsied.   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 Queenie - piecemal resection of duodenal adenoma via EMR - path c/w adenoma EGD 10/07/2017 - [facility], stomach filled with food, procedure aborted EGD 09/16/2017 - polypectomy was planned but procedure aborted due to size of polyp and spasm, referred to [facility]   EGD 07/15/2017 - irregular z-line, multiple gastric polyps, duodenal polyp,  Colonoscopy 09/18/2012 - small polyps, diverticulosis, one TA - recall in 5 years   Colonoscopy 04/24/19 - The perianal and digital rectal examinations were normal. - A single large angiodysplastic lesion was found in the cecum. - A diminutive polyp was found in the cecum. The polyp was sessile. The polyp was removed with a cold snare. Resection and retrieval were complete. - A 3 mm polyp was found in the hepatic flexure. The polyp was sessile. The polyp was removed with a cold snare. Resection and retrieval were complete. - Four sessile polyps were found in the transverse colon. The polyps were 3 to 4 mm in size. These  polyps were removed with a cold snare. Resection and retrieval were complete. - Multiple small-mouthed diverticula were found in the left colon. - The colon was extremely spastic which prolonged the procedure. - The exam was otherwise without abnormality.   Path c/w adenomas - repeat colonoscopy in 3 years     Fecal pancreatic elastase 31 Fecal lactoferrin negative   MRCP 01/11/21: IMPRESSION: 1. Normal pancreas. 2. Hepatic steatosis. 3. Cholelithiasis without evidence cholecystitis. No choledocholithiasis 4. Indeterminate lesion in the spleen likely represents a benign complex cyst or hemangioma.       EGD 02/28/22: - Esophagogastric landmarks were identified: the Z-line was found at 39 cm, the gastroesophageal junction was found at 40 cm and the upper extent of the gastric folds was found at 40 cm from the incisors. Findings: - The Z-line was irregula with a very short 5mm to 10mm tongue of salmon colored mucosa. Biopsies were taken with a cold forceps for histology. - Areas of ectopic gastric mucosa were found in the upper third of the esophagus. - The exam of the esophagus was otherwise normal. - A single 3 to 4 mm sessile polyp was found in the gastric body. The polyp was removed with a cold biopsy forceps. Resection and retrieval were complete. - Nodular / benign polypoid mucosa was found in the gastric antrum. Biopsies were taken with a cold forceps for histology. - The exam of the stomach was otherwise normal. - The examined duodenum was normal other than a small duodenal AVM. Biopsies for histology were taken with a  cold forceps for evaluation of celiac disease. No polyps.   High risk colon cancer surveillance: Personal history of colonic polyps - numerous adenomas removed (>25) lat 2 exams, last exam 2020 - history of pancreatic insufficiency on replacement enzymes with continued bowel symptoms Colonoscopy 02/28/22: - The examined portion of the ileum was normal. - A single colonic  angiodysplastic lesion. - Diverticulosis in the sigmoid colon. - One 3 mm polyp in the ascending colon, removed with a cold snare. Resected and retrieved. - Five 3 to 4 mm polyps in the transverse colon, removed with a cold snare. Resected and retrieved. - Internal hemorrhoids. - Poor air retention in the left colon, residual stool that required lavage. - The examination was otherwise normal. - Biopsies were taken with a cold forceps from the right colon, left colon and transverse colon for evaluation of microscopic colitis.   1. Surgical [P], duodenal BENIGN DUODENAL MUCOSA WITH NO DIAGNOSTIC ABNORMALITY 2. Surgical [P], gastric antrum GASTROPATHY WITH FOVEOLAR HYPERPLASIA AND CHANGES COMPATIBLE WITH PRIOR EROSION NEGATIVE FOR H. PYLORI, INTESTINAL METAPLASIA, DYSPLASIA AND CARCINOMA 3. Surgical [P], stomach polyp CHANGES COMPATIBLE WITH EARLY FUNDIC GLAND POLYP REACTIVE GASTROPATHY NEGATIVE FOR H. PYLORI, INTESTINAL METAPLASIA, DYSPLASIA AND CARCINOMA 4. Surgical [P], lower esophageal biopsy's, polyp (1) MINIMAL CHRONIC GASTRITIS WITH FOVEOLAR HYPERPLASIA COMPATIBLE WITH HYPERPLASTIC POLYP NEGATIVE FOR SQUAMOUS EPITHELIUM NEGATIVE FOR INTESTINAL METAPLASIA, DYSPLASIA AND CARCINOMA 5. Surgical [P], colon, transverse, ascending, polyp (6) TUBULAR ADENOMAS NEGATIVE FOR HIGH-GRADE DYSPLASIA AND CARCINOMA 6. Surgical [P], colon nos, random sites BENIGN COLONIC MUCOSA WITH NO DIAGNOSTIC ABNORMALITY       Fecal elastase < 50 in 2023 at [facility]   EUS 07/24/22: ENDOSONOGRAPHIC FINDING: : There was no sign of significant endosonographic  parenchymal or ductal abnormality in the pancreatic  head, genu of the pancreas, pancreatic body and  pancreatic tail. The PD measured 0.9 mm in the head,  2.5 mm in the neck, 1.0 mm in the body, and 0.9 mm in  the tail. There was no sign of significant endosonographic  abnormality in the common bile duct (3.1 mm) and in  the common hepatic duct (2.5  mm). There was no sign of significant endosonographic  abnormality in the ampulla. Endosonographic imaging in the left lobe of the liver  showed no abnormalities. The celiac region was visualized and showed no sign of  significant endosonographic abnormality. Estimated Blood Loss: Estimated blood loss: none. Impression: EGD Impressions: - Normal esophagus. - Normal stomach. - Normal examined duodenum. EUS Impressions: - There was no sign of significant pathology in the  pancreatic head, genu of the pancreas, pancreatic body  and pancreatic tail. No evidence of chronic  pancreatitis or duct abnormality. - There was no sign of significant pathology in the  common bile duct and in the common hepatic duct. - There was no sign of significant pathology in the  ampulla. - Normal visualized portions of the liver. - Normal celiac region. - No specimens collected. Recommendation: - Discharge patient to home (ambulatory). - The findings and recommendations were discussed with  the patient. - Return to [facility] in 3 months for  further follow-up.    CT enterography 04/02/22 -  IMPRESSION: No radiographic evidence of inflammatory bowel disease or other acute findings.   Colonic diverticulosis, without radiographic evidence of diverticulitis.   Bilateral nephrolithiasis. No evidence of ureteral calculi or hydronephrosis.   Cholelithiasis. No radiographic evidence of cholecystitis.   Aortic Atherosclerosis (ICD10-I70.0).    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    stenosis and arthritis   Depression    Diabetes (HCC)    Diverticulosis    ED (erectile dysfunction)    Exocrine pancreatic insufficiency    Fatty liver    GERD (gastroesophageal reflux disease)    HOH (hard of hearing)     Hyperlipidemia    Hypertension    Hypothyroidism    Internal hemorrhoids    Leg edema, left    Macular degeneration    Neuromuscular disorder (HCC)    Osteoarthritis    Peripheral neuropathy    Sleep apnea    cpap   Testosterone deficiency    Tremors of nervous system    Umbilical hernia     Past Surgical History:  Procedure Laterality Date   BACK SURGERY  2009   BACK SURGERY  09/2014   Dr Walterine   CATARACT EXTRACTION, BILATERAL  12/2016   COLONOSCOPY     ESOPHAGOGASTRODUODENOSCOPY (EGD) WITH PROPOFOL  N/A 09/16/2017   Procedure: ESOPHAGOGASTRODUODENOSCOPY (EGD) WITH PROPOFOL ;  Surgeon: Leigh Elspeth SQUIBB, MD;  Location: [facility];  Service: Gastroenterology;  Laterality: N/A;   LUMBAR LAMINECTOMY/ DECOMPRESSION WITH MET-RX  2014   TONSILLECTOMY  1956   UMBILICAL HERNIA REPAIR      Current Outpatient Medications  Medication Sig Dispense Refill   Alpha Lipoic Acid 200 MG CAPS Take 200 mg by mouth every morning.     anastrozole (ARIMIDEX) 1 MG tablet Take 1 mg daily by mouth.     apixaban  (ELIQUIS ) 5 MG TABS tablet TAKE 1 TABLET(5 MG) BY MOUTH TWICE DAILY 60 tablet 5   atorvastatin (LIPITOR) 20 MG tablet Take 20 mg by mouth daily.     Cyanocobalamin 5000 MCG CAPS Take 2 capsules by mouth in the morning and at bedtime.     desvenlafaxine (PRISTIQ) 50 MG 24 hr tablet Take 50 mg by mouth every morning.     diclofenac Sodium (VOLTAREN) 1 % GEL Apply topically daily as needed.     diphenoxylate -atropine  (LOMOTIL ) 2.5-0.025 MG tablet Take 1 tablet by mouth 4 (four) times daily as needed for diarrhea or loose stools. 120 tablet 3   donepezil (ARICEPT) 10 MG tablet Take 10 mg by mouth daily.     empagliflozin (JARDIANCE) 25 MG TABS tablet Take 25 mg by mouth daily.     finasteride (PROSCAR) 5 MG tablet Take 5 mg by mouth daily.     fluocinonide ointment (LIDEX) 0.05 % Apply 1 application topically 2 (two) times daily as needed (IRRITATION).     fluticasone (FLONASE) 50 MCG/ACT nasal  spray Place 1 spray into both nostrils daily as needed for allergies or rhinitis.     folic acid (FOLVITE) 1 MG tablet Take 1 tablet by mouth daily.     gabapentin (NEURONTIN) 100 MG capsule Take 200 mg by mouth at bedtime.     glimepiride (AMARYL) 1 MG tablet Take 1 mg by mouth daily with breakfast.     Lancets (ONETOUCH ULTRASOFT) lancets 1 each by Other route daily.     lipase/protease/amylase (CREON ) 36000 UNITS CPEP capsule Take 6 capsules with each meal take 2 capsules with snacks 1980 capsule 1   losartan (COZAAR) 50 MG tablet Take 1 tablet by mouth daily.     Melatonin 3 MG CAPS Take by mouth  3 (three) times daily.     memantine (NAMENDA) 10 MG tablet Take 10 mg by mouth 2 (two) times daily.     metFORMIN (GLUCOPHAGE-XR) 500 MG 24 hr tablet Take 1,000 mg by mouth 2 (two) times daily.     metoprolol  succinate (TOPROL -XL) 50 MG 24 hr tablet TAKE 1 TABLET(50 MG) BY MOUTH DAILY WITH OR IMMEDIATELY FOLLOWING A MEAL 90 tablet 3   MILK THISTLE PO Take 1 capsule by mouth daily as needed.     Multiple Vitamin (MULTI VITAMIN DAILY) TABS Take 1 tablet by mouth every morning. 30 tablet    Multiple Vitamins-Minerals (ICAPS AREDS 2 PO) Take 2 capsules by mouth in the morning and at bedtime.     mupirocin ointment (BACTROBAN) 2 % Apply topically 2 (two) times daily as needed. to affected area     pantoprazole (PROTONIX) 40 MG tablet Take 40 mg by mouth.     sertraline (ZOLOFT) 100 MG tablet Take 200 mg by mouth daily.      silodosin (RAPAFLO) 8 MG CAPS capsule Take 8 mg by mouth daily.     testosterone cypionate (DEPOTESTOTERONE CYPIONATE) 200 MG/ML injection Inject 200 mg into the muscle every 14 (fourteen) days.   1   Thiamine HCl (B-1) 100 MG TABS Take 2 tablets by mouth daily.     traMADol (ULTRAM) 50 MG tablet      traZODone (DESYREL) 25 mg TABS tablet Take 25 mg by mouth at bedtime.     TURMERIC PO Take 1,000 mg by mouth as needed.     No current facility-administered medications for this  visit.    Allergies as of 09/25/2024   (No Known Allergies)    Family History  Problem Relation Age of Onset   Atrial fibrillation Brother    Cancer Mother 12       abdominal cancer, unk name of cancer type   Other Father        viral encephalysis    Heart attack Maternal Grandfather    Heart attack Paternal Grandfather    Colon cancer Neg Hx    Stomach cancer Neg Hx    Colon polyps Neg Hx    Esophageal cancer Neg Hx    Rectal cancer Neg Hx     Social History   Socioeconomic History   Marital status: Single    Spouse name: Not on file   Number of children: 0   Years of education: 16   Highest education level: Not on file  Occupational History   Occupation: warden/ranger and performer  Tobacco Use   Smoking status: Former    Current packs/day: 0.00    Types: Cigarettes    Quit date: 08/25/2002    Years since quitting: 22.1   Smokeless tobacco: Never   Tobacco comments:    in college only 40 years ago  Vaping Use   Vaping status: Never Used  Substance and Sexual Activity   Alcohol use: Yes    Alcohol/week: 14.0 - 28.0 standard drinks of alcohol    Types: 14 - 28 Standard drinks or equivalent per week    Comment: varies  daily drinker   Drug use: No   Sexual activity: Not Currently  Other Topics Concern   Not on file  Social History Narrative   Has a fiance- Idell Dooms   Social Drivers of Health   Tobacco Use: Medium Risk (09/16/2024)   Received from [facility]   Patient History    Smoking Tobacco  Use: Former    Smokeless Tobacco Use: Never    Passive Exposure: Not on Actuary Strain: Not on file  Food Insecurity: Unknown (01/20/2024)   Received from [facility]   Epic    Within the past 12 months, you worried that your food would run out before you got money to buy more: Patient declined to answer    Within the past 12 months, the food you bought just didn't last and you didn't have money to get more. : Patient declined to answer   Transportation Needs: Not on file (01/20/2024)  Physical Activity: Not on file  Stress: Not on file  Social Connections: Unknown (01/07/2022)   Received from [facility]   Social Network    Social Network: Not on file  Intimate Partner Violence: Unknown (11/29/2021)   Received from [facility]   HITS    Physically Hurt: Not on file    Insult or Talk Down To: Not on file    Threaten Physical Harm: Not on file    Scream or Curse: Not on file  Depression (EYV7-0): Not on file  Alcohol Screen: Not on file  Housing: Low Risk (01/20/2024)   Received from [facility]   Epic    What is your living situation today?: I have a steady place to live    Think about the place you live. Do you have problems with any of the following? Choose all that apply:: Pt declined to answer  Utilities: Unknown (01/20/2024)   Received from [facility]   Utilities    In the past 12 months has the electric, gas, oil, or water company threatened to shut off services in your home? : Patient declined to answer  Health Literacy: Not on file    Review of Systems:    Constitutional: No weight loss, fever, chills, weakness or fatigue HEENT: Eyes: No change in vision               Ears, Nose, Throat:  No change in hearing or congestion Skin: No rash or itching Cardiovascular: No chest pain, chest pressure or palpitations   Respiratory: No SOB or cough Gastrointestinal: See HPI and otherwise negative Genitourinary: No dysuria or change in urinary frequency Neurological: No headache, dizziness or syncope Musculoskeletal: No new muscle or joint pain Hematologic: No bleeding or bruising Psychiatric: No history of depression or anxiety    Physical Exam:  Vital signs: There were no vitals taken for this visit.  Constitutional: NAD, alert and cooperative Head:  Normocephalic and atraumatic. Eyes:   PEERL, EOMI. No icterus. Conjunctiva pink. Respiratory: Respirations even and unlabored. Lungs clear to  auscultation bilaterally.   No wheezes, crackles, or rhonchi.  Cardiovascular:  Regular rate and rhythm. No peripheral edema, cyanosis or pallor.  Gastrointestinal:  Soft, nondistended, nontender. No rebound or guarding. Normal bowel sounds. No appreciable masses or hepatomegaly. Rectal:  Declines Msk:  Symmetrical without gross deformities. Without edema, no deformity or joint abnormality.  Neurologic:  Alert and  oriented x4;  grossly normal neurologically.  Skin:   Dry and intact without significant lesions or rashes. Psychiatric: Oriented to person, place and time. Demonstrates good judgement and reason without abnormal affect or behaviors.  Physical Exam    RELEVANT LABS AND IMAGING: CBC    Component Value Date/Time   WBC 7.5 05/29/2021 0132   RBC 4.58 05/29/2021 0132   HGB 15.7 05/29/2021 0132   HCT 47.0 05/29/2021 0132   PLT 125 (L) 05/29/2021 0132  MCV 102.6 (H) 05/29/2021 0132   MCH 34.3 (H) 05/29/2021 0132   MCHC 33.4 05/29/2021 0132   RDW 13.4 05/29/2021 0132   LYMPHSABS 1.4 05/29/2021 0132   MONOABS 1.2 (H) 05/29/2021 0132   EOSABS 0.0 05/29/2021 0132   BASOSABS 0.0 05/29/2021 0132    CMP     Component Value Date/Time   NA 142 05/29/2021 0132   K 3.8 05/29/2021 0132   CL 104 05/29/2021 0132   CO2 23 05/29/2021 0132   GLUCOSE 139 (H) 05/29/2021 0132   BUN 21 05/29/2021 0132   CREATININE 0.98 05/29/2021 0132   CREATININE 1.25 (H) 02/22/2020 1426   CALCIUM 9.4 05/29/2021 0132   PROT 7.1 05/29/2021 0132   ALBUMIN 4.4 05/29/2021 0132   AST 26 05/29/2021 0132   AST 73 (H) 02/22/2020 1426   ALT 41 05/29/2021 0132   ALT 112 (H) 02/22/2020 1426   ALKPHOS 55 05/29/2021 0132   BILITOT 1.7 (H) 05/29/2021 0132   BILITOT 0.8 02/22/2020 1426   GFRNONAA >60 05/29/2021 0132   GFRNONAA 58 (L) 02/22/2020 1426   GFRAA >60 02/22/2020 1426     Assessment/Plan:    Chronic diarrhea Previous workup with MRCP, EUS, enterography study unrevealing with persistently low  fecal elastase improvement on Creon .  Negative colon biopsies 2023.  Not a candidate for Viberzi due to alcohol use.  Patient was put on Lomotil  and rifaximin  at last visit with Dr. Leigh 12/2023  Barrett's esophagus History of duodenal adenoma EGD 02/2022 with no evidence of recurrent duodenal adenoma, negative celiac. Barrett's without dysplasia due for repeat EGD 02/2024  History of colon polyps Colonoscopy 02/2022 with 6 tubular adenomas and recall 3 years - Repeat 02/2025  A-fib On Eliquis  per Dr. Ladona, echo 11/2023 with EF 55 to 60% and no aortic stenosis  OSA on CPAP  Diabetes with peripheral neuropathy   EGD 03/14/2020 - Esophagogastric landmarks identified. - 1 cm hiatal hernia. - Z-line irregular as outlined above. Biopsied. - A few benign appearing gastric polyps. - Small superficial gastric ulcer with no stigmata of bleeding. - Nodular mucosa in the gastric antrum with erosive changes. Biopsied. - Normal stomach otherwise, biopsies taken to rule out H pylori - Mucosal changes in the duodenum as outlined - no high risk lesions appreciated. Biopsied.   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 Queenie - piecemal resection of duodenal adenoma via EMR - path c/w adenoma EGD 10/07/2017 - [facility], stomach filled with food, procedure aborted EGD 09/16/2017 - polypectomy was planned but procedure aborted due to size of polyp and spasm, referred to [facility]   EGD 07/15/2017 - irregular z-line, multiple gastric polyps, duodenal polyp,  Colonoscopy 09/18/2012 - small polyps, diverticulosis, one TA - recall in 5 years   Colonoscopy 04/24/19 - The perianal and digital rectal examinations were normal. - A single large angiodysplastic lesion was found in the cecum. - A diminutive polyp was  found in the cecum. The polyp was sessile. The polyp was removed with a cold snare. Resection and retrieval were complete. - A 3 mm polyp was found in the hepatic flexure. The polyp was sessile. The polyp was removed with a cold snare. Resection and retrieval were complete. - Four sessile polyps were found in the transverse colon. The polyps were 3 to  4 mm in size. These polyps were removed with a cold snare. Resection and retrieval were complete. - Multiple small-mouthed diverticula were found in the left colon. - The colon was extremely spastic which prolonged the procedure. - The exam was otherwise without abnormality.   Path c/w adenomas - repeat colonoscopy in 3 years     Fecal pancreatic elastase 31 Fecal lactoferrin negative   MRCP 01/11/21: IMPRESSION: 1. Normal pancreas. 2. Hepatic steatosis. 3. Cholelithiasis without evidence cholecystitis. No choledocholithiasis 4. Indeterminate lesion in the spleen likely represents a benign complex cyst or hemangioma.       EGD 02/28/22: - Esophagogastric landmarks were identified: the Z-line was found at 39 cm, the gastroesophageal junction was found at 40 cm and the upper extent of the gastric folds was found at 40 cm from the incisors. Findings: - The Z-line was irregula with a very short 5mm to 10mm tongue of salmon colored mucosa. Biopsies were taken with a cold forceps for histology. - Areas of ectopic gastric mucosa were found in the upper third of the esophagus. - The exam of the esophagus was otherwise normal. - A single 3 to 4 mm sessile polyp was found in the gastric body. The polyp was removed with a cold biopsy forceps. Resection and retrieval were complete. - Nodular / benign polypoid mucosa was found in the gastric antrum. Biopsies were taken with a cold forceps for histology. - The exam of the stomach was otherwise normal. - The examined duodenum was normal other than a small duodenal AVM. Biopsies for histology were taken with a  cold forceps for evaluation of celiac disease. No polyps.   High risk colon cancer surveillance: Personal history of colonic polyps - numerous adenomas removed (>25) lat 2 exams, last exam 2020 - history of pancreatic insufficiency on replacement enzymes with continued bowel symptoms Colonoscopy 02/28/22: - The examined portion of the ileum was normal. - A single colonic angiodysplastic lesion. - Diverticulosis in the sigmoid colon. - One 3 mm polyp in the ascending colon, removed with a cold snare. Resected and retrieved. - Five 3 to 4 mm polyps in the transverse colon, removed with a cold snare. Resected and retrieved. - Internal hemorrhoids. - Poor air retention in the left colon, residual stool that required lavage. - The examination was otherwise normal. - Biopsies were taken with a cold forceps from the right colon, left colon and transverse colon for evaluation of microscopic colitis.   1. Surgical [P], duodenal BENIGN DUODENAL MUCOSA WITH NO DIAGNOSTIC ABNORMALITY 2. Surgical [P], gastric antrum GASTROPATHY WITH FOVEOLAR HYPERPLASIA AND CHANGES COMPATIBLE WITH PRIOR EROSION NEGATIVE FOR H. PYLORI, INTESTINAL METAPLASIA, DYSPLASIA AND CARCINOMA 3. Surgical [P], stomach polyp CHANGES COMPATIBLE WITH EARLY FUNDIC GLAND POLYP REACTIVE GASTROPATHY NEGATIVE FOR H. PYLORI, INTESTINAL METAPLASIA, DYSPLASIA AND CARCINOMA 4. Surgical [P], lower esophageal biopsy's, polyp (1) MINIMAL CHRONIC GASTRITIS WITH FOVEOLAR HYPERPLASIA COMPATIBLE WITH HYPERPLASTIC POLYP NEGATIVE FOR SQUAMOUS EPITHELIUM NEGATIVE FOR INTESTINAL METAPLASIA, DYSPLASIA AND CARCINOMA 5. Surgical [P], colon, transverse, ascending, polyp (6) TUBULAR ADENOMAS NEGATIVE FOR HIGH-GRADE DYSPLASIA AND CARCINOMA 6. Surgical [P], colon nos, random sites BENIGN COLONIC MUCOSA WITH NO DIAGNOSTIC ABNORMALITY       Fecal elastase < 50 in 2023 at [facility]   EUS 07/24/22: ENDOSONOGRAPHIC FINDING: : There was no sign of significant  endosonographic  parenchymal or ductal abnormality in the pancreatic  head, genu of the pancreas, pancreatic body and  pancreatic tail. The PD measured 0.9 mm in the head,  2.5 mm in the  neck, 1.0 mm in the body, and 0.9 mm in  the tail. There was no sign of significant endosonographic  abnormality in the common bile duct (3.1 mm) and in  the common hepatic duct (2.5 mm). There was no sign of significant endosonographic  abnormality in the ampulla. Endosonographic imaging in the left lobe of the liver  showed no abnormalities. The celiac region was visualized and showed no sign of  significant endosonographic abnormality. Estimated Blood Loss: Estimated blood loss: none. Impression: EGD Impressions: - Normal esophagus. - Normal stomach. - Normal examined duodenum. EUS Impressions: - There was no sign of significant pathology in the  pancreatic head, genu of the pancreas, pancreatic body  and pancreatic tail. No evidence of chronic  pancreatitis or duct abnormality. - There was no sign of significant pathology in the  common bile duct and in the common hepatic duct. - There was no sign of significant pathology in the  ampulla. - Normal visualized portions of the liver. - Normal celiac region. - No specimens collected. Recommendation: - Discharge patient to home (ambulatory). - The findings and recommendations were discussed with  the patient. - Return to [facility] in 3 months for  further follow-up.    CT enterography 04/02/22 -  IMPRESSION: No radiographic evidence of inflammatory bowel disease or other acute findings.   Colonic diverticulosis, without radiographic evidence of diverticulitis.   Bilateral nephrolithiasis. No evidence of ureteral calculi or hydronephrosis.   Cholelithiasis. No radiographic evidence of cholecystitis.   Aortic Atherosclerosis (ICD10-I70.0).  Nestor Blower, PA-C [facility] 09/25/2024, 8:12 AM  Cc: Debrah Josette ORN., PA-C "
